# Patient Record
Sex: Female | Born: 1935 | Race: Black or African American | Hispanic: No | Smoking: Never smoker
Health system: Southern US, Community
[De-identification: ages and names within clinical notes are randomized; demographics above are authoritative.]

## PROBLEM LIST (undated history)

## (undated) DIAGNOSIS — H409 Unspecified glaucoma: Secondary | ICD-10-CM

## (undated) DIAGNOSIS — C50919 Malignant neoplasm of unspecified site of unspecified female breast: Secondary | ICD-10-CM

## (undated) DIAGNOSIS — M1990 Unspecified osteoarthritis, unspecified site: Secondary | ICD-10-CM

## (undated) DIAGNOSIS — I10 Essential (primary) hypertension: Secondary | ICD-10-CM

## (undated) DIAGNOSIS — F0390 Unspecified dementia without behavioral disturbance: Secondary | ICD-10-CM

## (undated) DIAGNOSIS — T7840XA Allergy, unspecified, initial encounter: Secondary | ICD-10-CM

## (undated) DIAGNOSIS — D649 Anemia, unspecified: Secondary | ICD-10-CM

## (undated) DIAGNOSIS — F329 Major depressive disorder, single episode, unspecified: Secondary | ICD-10-CM

## (undated) DIAGNOSIS — K922 Gastrointestinal hemorrhage, unspecified: Secondary | ICD-10-CM

## (undated) DIAGNOSIS — E785 Hyperlipidemia, unspecified: Secondary | ICD-10-CM

## (undated) HISTORY — DX: Unspecified osteoarthritis, unspecified site: M19.90

## (undated) HISTORY — PX: EYE SURGERY: SHX253

## (undated) HISTORY — DX: Anemia, unspecified: D64.9

## (undated) HISTORY — DX: Allergy, unspecified, initial encounter: T78.40XA

## (undated) HISTORY — DX: Gastrointestinal hemorrhage, unspecified: K92.2

## (undated) HISTORY — DX: Essential (primary) hypertension: I10

---

## 2004-11-07 ENCOUNTER — Ambulatory Visit: Payer: Self-pay | Admitting: Internal Medicine

## 2004-11-09 ENCOUNTER — Ambulatory Visit: Payer: Self-pay | Admitting: Internal Medicine

## 2005-04-12 ENCOUNTER — Ambulatory Visit: Payer: Self-pay | Admitting: Internal Medicine

## 2005-05-10 ENCOUNTER — Ambulatory Visit: Payer: Self-pay | Admitting: Internal Medicine

## 2005-08-08 ENCOUNTER — Ambulatory Visit: Payer: Self-pay | Admitting: Internal Medicine

## 2005-08-10 ENCOUNTER — Ambulatory Visit: Payer: Self-pay | Admitting: Internal Medicine

## 2005-12-07 ENCOUNTER — Ambulatory Visit: Payer: Self-pay | Admitting: Internal Medicine

## 2005-12-10 ENCOUNTER — Ambulatory Visit: Payer: Self-pay | Admitting: Internal Medicine

## 2005-12-18 ENCOUNTER — Ambulatory Visit: Payer: Self-pay | Admitting: Family Medicine

## 2006-01-07 ENCOUNTER — Ambulatory Visit: Payer: Self-pay | Admitting: Family Medicine

## 2006-04-05 ENCOUNTER — Ambulatory Visit: Payer: Self-pay | Admitting: Internal Medicine

## 2006-04-09 ENCOUNTER — Ambulatory Visit: Payer: Self-pay | Admitting: Internal Medicine

## 2006-04-26 ENCOUNTER — Emergency Department: Payer: Self-pay | Admitting: Emergency Medicine

## 2006-04-26 ENCOUNTER — Other Ambulatory Visit: Payer: Self-pay

## 2006-07-09 ENCOUNTER — Ambulatory Visit: Payer: Self-pay | Admitting: Family Medicine

## 2006-08-08 ENCOUNTER — Ambulatory Visit: Payer: Self-pay | Admitting: Internal Medicine

## 2006-08-10 ENCOUNTER — Ambulatory Visit: Payer: Self-pay | Admitting: Internal Medicine

## 2007-05-01 ENCOUNTER — Ambulatory Visit: Payer: Self-pay | Admitting: Gastroenterology

## 2007-08-05 ENCOUNTER — Ambulatory Visit: Payer: Self-pay | Admitting: Family Medicine

## 2007-12-11 ENCOUNTER — Ambulatory Visit: Payer: Self-pay | Admitting: Internal Medicine

## 2008-01-07 ENCOUNTER — Ambulatory Visit: Payer: Self-pay | Admitting: Internal Medicine

## 2008-01-11 ENCOUNTER — Ambulatory Visit: Payer: Self-pay | Admitting: Internal Medicine

## 2008-02-08 ENCOUNTER — Ambulatory Visit: Payer: Self-pay | Admitting: Internal Medicine

## 2008-02-12 ENCOUNTER — Ambulatory Visit: Payer: Self-pay | Admitting: Internal Medicine

## 2008-03-10 ENCOUNTER — Ambulatory Visit: Payer: Self-pay | Admitting: Internal Medicine

## 2008-04-09 ENCOUNTER — Ambulatory Visit: Payer: Self-pay | Admitting: Internal Medicine

## 2008-05-10 ENCOUNTER — Ambulatory Visit: Payer: Self-pay | Admitting: Internal Medicine

## 2008-09-27 ENCOUNTER — Ambulatory Visit: Payer: Self-pay | Admitting: Family Medicine

## 2009-10-19 ENCOUNTER — Ambulatory Visit: Payer: Self-pay | Admitting: Family Medicine

## 2010-05-19 ENCOUNTER — Emergency Department: Payer: Self-pay | Admitting: Unknown Physician Specialty

## 2010-06-17 ENCOUNTER — Emergency Department: Payer: Self-pay | Admitting: Internal Medicine

## 2010-06-28 ENCOUNTER — Ambulatory Visit: Payer: Self-pay | Admitting: Family Medicine

## 2010-06-28 ENCOUNTER — Inpatient Hospital Stay (HOSPITAL_COMMUNITY): Admission: EM | Admit: 2010-06-28 | Discharge: 2010-06-29 | Payer: Self-pay | Admitting: Emergency Medicine

## 2010-06-29 ENCOUNTER — Encounter: Payer: Self-pay | Admitting: Family Medicine

## 2010-06-29 ENCOUNTER — Ambulatory Visit: Payer: Self-pay | Admitting: Vascular Surgery

## 2010-07-08 ENCOUNTER — Emergency Department (HOSPITAL_COMMUNITY): Admission: EM | Admit: 2010-07-08 | Discharge: 2010-07-08 | Payer: Self-pay | Admitting: Emergency Medicine

## 2010-07-25 ENCOUNTER — Ambulatory Visit: Payer: Self-pay | Admitting: Unknown Physician Specialty

## 2010-07-26 LAB — PATHOLOGY REPORT

## 2010-08-03 ENCOUNTER — Emergency Department: Payer: Self-pay | Admitting: Emergency Medicine

## 2010-08-05 ENCOUNTER — Emergency Department: Payer: Self-pay | Admitting: Emergency Medicine

## 2010-11-16 ENCOUNTER — Ambulatory Visit: Payer: Self-pay | Admitting: Family Medicine

## 2011-02-24 LAB — CK TOTAL AND CKMB (NOT AT ARMC)
CK, MB: 2.2 ng/mL (ref 0.3–4.0)
Relative Index: INVALID (ref 0.0–2.5)
Total CK: 38 U/L (ref 7–177)
Total CK: 40 U/L (ref 7–177)

## 2011-02-24 LAB — CBC
HCT: 40 % (ref 36.0–46.0)
Hemoglobin: 13.3 g/dL (ref 12.0–15.0)
MCH: 29 pg (ref 26.0–34.0)
MCH: 29.7 pg (ref 26.0–34.0)
MCHC: 32.8 g/dL (ref 30.0–36.0)
MCV: 88.7 fL (ref 78.0–100.0)
MCV: 88.8 fL (ref 78.0–100.0)
MCV: 89.2 fL (ref 78.0–100.0)
Platelets: 238 10*3/uL (ref 150–400)
RBC: 4.8 MIL/uL (ref 3.87–5.11)
RDW: 15.1 % (ref 11.5–15.5)
RDW: 15.3 % (ref 11.5–15.5)
WBC: 7.4 10*3/uL (ref 4.0–10.5)
WBC: 7.6 10*3/uL (ref 4.0–10.5)

## 2011-02-24 LAB — DIFFERENTIAL
Basophils Relative: 1 % (ref 0–1)
Lymphocytes Relative: 13 % (ref 12–46)
Lymphs Abs: 1.1 10*3/uL (ref 0.7–4.0)
Monocytes Absolute: 0.7 10*3/uL (ref 0.1–1.0)
Neutro Abs: 6.4 10*3/uL (ref 1.7–7.7)
Neutrophils Relative %: 76 % (ref 43–77)

## 2011-02-24 LAB — COMPREHENSIVE METABOLIC PANEL
ALT: 17 U/L (ref 0–35)
AST: 21 U/L (ref 0–37)
Alkaline Phosphatase: 52 U/L (ref 39–117)
Alkaline Phosphatase: 61 U/L (ref 39–117)
BUN: 14 mg/dL (ref 6–23)
CO2: 26 mEq/L (ref 19–32)
CO2: 30 mEq/L (ref 19–32)
Chloride: 106 mEq/L (ref 96–112)
Creatinine, Ser: 1.12 mg/dL (ref 0.4–1.2)
GFR calc Af Amer: 56 mL/min — ABNORMAL LOW (ref >60)
GFR calc Af Amer: 56 mL/min — ABNORMAL LOW (ref >60)
GFR calc non Af Amer: 47 mL/min — ABNORMAL LOW (ref >60)
Glucose, Bld: 94 mg/dL (ref 70–99)
Sodium: 138 mEq/L (ref 135–145)
Total Protein: 7.6 g/dL (ref 6.0–8.3)

## 2011-02-24 LAB — URINE CULTURE

## 2011-02-24 LAB — BASIC METABOLIC PANEL
CO2: 26 mEq/L (ref 19–32)
Calcium: 8.9 mg/dL (ref 8.4–10.5)
Chloride: 102 mEq/L (ref 96–112)
GFR calc Af Amer: >60 mL/min (ref >60)
Glucose, Bld: 106 mg/dL — ABNORMAL HIGH (ref 70–99)
Potassium: 3.8 mEq/L (ref 3.5–5.1)
Sodium: 137 mEq/L (ref 135–145)

## 2011-02-24 LAB — URINALYSIS, ROUTINE W REFLEX MICROSCOPIC
Ketones, ur: NEGATIVE mg/dL
Urobilinogen, UA: 1 mg/dL (ref 0.0–1.0)

## 2011-02-24 LAB — TSH: TSH: 1.365 u[IU]/mL (ref 0.350–4.500)

## 2011-02-24 LAB — TROPONIN I
Troponin I: 0.01 ng/mL (ref 0.00–0.06)
Troponin I: 0.02 ng/mL (ref 0.00–0.06)

## 2011-02-24 LAB — POCT CARDIAC MARKERS: Myoglobin, poc: 88.2 ng/mL (ref 12–200)

## 2011-02-24 LAB — URINE MICROSCOPIC-ADD ON

## 2011-12-26 ENCOUNTER — Ambulatory Visit: Payer: Self-pay | Admitting: Family Medicine

## 2012-12-10 HISTORY — PX: COLONIC EMBOLIZATION: SHX1373

## 2013-01-25 LAB — COMPREHENSIVE METABOLIC PANEL
Albumin: 3.3 g/dL — ABNORMAL LOW (ref 3.4–5.0)
Anion Gap: 6 — ABNORMAL LOW (ref 7–16)
BUN: 35 mg/dL — ABNORMAL HIGH (ref 7–18)
Bilirubin,Total: 0.6 mg/dL (ref 0.2–1.0)
Chloride: 109 mmol/L — ABNORMAL HIGH (ref 98–107)
Co2: 25 mmol/L (ref 21–32)
Creatinine: 1.27 mg/dL (ref 0.60–1.30)
EGFR (African American): 45 — ABNORMAL LOW
Potassium: 4.2 mmol/L (ref 3.5–5.1)
SGOT(AST): 18 U/L (ref 15–37)
Sodium: 140 mmol/L (ref 136–145)

## 2013-01-25 LAB — CBC
HGB: 11.5 g/dL — ABNORMAL LOW (ref 12.0–16.0)
MCH: 29.2 pg (ref 26.0–34.0)
Platelet: 216 10*3/uL (ref 150–440)
WBC: 9 10*3/uL (ref 3.6–11.0)

## 2013-01-26 LAB — BASIC METABOLIC PANEL
Anion Gap: 8 (ref 7–16)
BUN: 28 mg/dL — ABNORMAL HIGH (ref 7–18)
Calcium, Total: 7.9 mg/dL — ABNORMAL LOW (ref 8.5–10.1)
Co2: 22 mmol/L (ref 21–32)
EGFR (African American): 55 — ABNORMAL LOW
Osmolality: 286 (ref 275–301)
Potassium: 3.8 mmol/L (ref 3.5–5.1)

## 2013-01-26 LAB — CBC WITH DIFFERENTIAL/PLATELET
Basophil #: 0 10*3/uL (ref 0.0–0.1)
HCT: 28.8 % — ABNORMAL LOW (ref 35.0–47.0)
HGB: 9.4 g/dL — ABNORMAL LOW (ref 12.0–16.0)
MCH: 29.3 pg (ref 26.0–34.0)
MCHC: 32.8 g/dL (ref 32.0–36.0)
Monocyte #: 0.6 x10 3/mm (ref 0.2–0.9)
Neutrophil #: 2.5 10*3/uL (ref 1.4–6.5)
RBC: 3.22 10*6/uL — ABNORMAL LOW (ref 3.80–5.20)

## 2013-01-26 LAB — LIPID PANEL: HDL Cholesterol: 45 mg/dL (ref 40–60)

## 2013-01-27 ENCOUNTER — Inpatient Hospital Stay: Payer: Self-pay | Admitting: Internal Medicine

## 2013-01-27 LAB — CBC WITH DIFFERENTIAL/PLATELET
Basophil #: 0 10*3/uL (ref 0.0–0.1)
Basophil %: 0.9 %
Basophil %: 0.7 %
Eosinophil #: 0.2 10*3/uL (ref 0.0–0.7)
Eosinophil %: 5.4 %
HGB: 9.8 g/dL — ABNORMAL LOW (ref 12.0–16.0)
HGB: 10.1 g/dL — ABNORMAL LOW (ref 12.0–16.0)
MCH: 29.4 pg (ref 26.0–34.0)
MCH: 29 pg (ref 26.0–34.0)
MCHC: 32.9 g/dL (ref 32.0–36.0)
Monocyte #: 0.5 x10 3/mm (ref 0.2–0.9)
Monocyte #: 0.5 x10 3/mm (ref 0.2–0.9)
Monocyte %: 10.5 %
Monocyte %: 8.8 %
Neutrophil #: 2.8 10*3/uL (ref 1.4–6.5)
Neutrophil #: 4 10*3/uL (ref 1.4–6.5)
Neutrophil %: 54.3 %
Platelet: 203 10*3/uL (ref 150–440)
Platelet: 205 10*3/uL (ref 150–440)
RDW: 14.7 % — ABNORMAL HIGH (ref 11.5–14.5)
WBC: 5.1 10*3/uL (ref 3.6–11.0)

## 2013-01-28 LAB — CBC WITH DIFFERENTIAL/PLATELET
Basophil #: 0 10*3/uL (ref 0.0–0.1)
Eosinophil #: 0.1 10*3/uL (ref 0.0–0.7)
Eosinophil %: 0.6 %
HCT: 29.1 % — ABNORMAL LOW (ref 35.0–47.0)
HGB: 9.5 g/dL — ABNORMAL LOW (ref 12.0–16.0)
Lymphocyte %: 7.8 %
MCH: 29.2 pg (ref 26.0–34.0)
Monocyte #: 0.7 x10 3/mm (ref 0.2–0.9)
Monocyte %: 8.3 %
Neutrophil #: 6.8 10*3/uL — ABNORMAL HIGH (ref 1.4–6.5)
Platelet: 207 10*3/uL (ref 150–440)
RBC: 3.25 10*6/uL — ABNORMAL LOW (ref 3.80–5.20)
RDW: 14.5 % (ref 11.5–14.5)

## 2013-01-29 LAB — CBC WITH DIFFERENTIAL/PLATELET
Basophil #: 0 10*3/uL (ref 0.0–0.1)
Basophil %: 0.4 %
Eosinophil %: 3.9 %
HCT: 27 % — ABNORMAL LOW (ref 35.0–47.0)
Lymphocyte #: 1.1 10*3/uL (ref 1.0–3.6)
Lymphocyte %: 16.4 %
MCH: 29.2 pg (ref 26.0–34.0)
MCV: 89 fL (ref 80–100)
Monocyte #: 0.8 x10 3/mm (ref 0.2–0.9)
Monocyte %: 12.4 %
Neutrophil #: 4.3 10*3/uL (ref 1.4–6.5)
Platelet: 197 10*3/uL (ref 150–440)
RBC: 3.04 10*6/uL — ABNORMAL LOW (ref 3.80–5.20)
WBC: 6.4 10*3/uL (ref 3.6–11.0)

## 2013-01-29 LAB — BASIC METABOLIC PANEL
Anion Gap: 8 (ref 7–16)
BUN: 5 mg/dL — ABNORMAL LOW (ref 7–18)
Calcium, Total: 8.1 mg/dL — ABNORMAL LOW (ref 8.5–10.1)
Co2: 24 mmol/L (ref 21–32)
EGFR (African American): >60
Potassium: 3.2 mmol/L — ABNORMAL LOW (ref 3.5–5.1)
Sodium: 144 mmol/L (ref 136–145)

## 2013-01-30 LAB — HEMOGLOBIN: HGB: 9 g/dL — ABNORMAL LOW (ref 12.0–16.0)

## 2014-02-13 ENCOUNTER — Emergency Department: Payer: Self-pay | Admitting: Emergency Medicine

## 2014-02-19 ENCOUNTER — Emergency Department: Payer: Self-pay | Admitting: Emergency Medicine

## 2014-03-16 DIAGNOSIS — I10 Essential (primary) hypertension: Secondary | ICD-10-CM | POA: Insufficient documentation

## 2014-03-16 HISTORY — DX: Essential (primary) hypertension: I10

## 2015-04-01 NOTE — Consult Note (Signed)
CC: lower GI bleeding.  Pt with stable hgb last 3 tests.  Passing darker stool.  Bleeding scan today to be sure no active bleeding.  Explained to family that she likely bled from diverticulosis and has quit.  Will have to go slowly on diet to allow clot in the diverrticulum to mature.  Can go to bid and then qday on hgb.  If rebleeds I will need to do colonoscopy.  Due to family hx will offer colon at a later date.  Electronic Signatures: Manya Silvas (MD)  (Signed on 18-Feb-14 11:24)  Authored  Last Updated: 18-Feb-14 11:24 by Manya Silvas (MD)

## 2015-04-01 NOTE — Consult Note (Signed)
General Aspect GI bleed   Present Illness The patient is an 81 year old female with past medical history of hypertension, depression, hyperlipidemia and peptic ulcer disease in the past. Several days ago she noticed that she had a dark red-maroon-colored stool and after that, she had similar 3 to 4 episodes today presenting to the ER for this. She was feeling mildly dizzy this morning, but denies any vomiting, nausea or abdominal pain associated with this and denies any constipation and denies any similar episode in the past. On further questioning, she admits using aspirin every day, but denies any use of any other pain medications like Advil or Motrin. She had a history of gastric ulcer disease in 2011.  Bleeding scan is positive and I am asked embolize the bleeding site.  PAST MEDICAL HISTORY:  Hypertension, hyperlipidemia, depression and peptic ulcer disease in the past.   PAST SURGICAL HISTORY:  None.   Home Medications: Medication Instructions Status  Aspirin Enteric Coated 81 mg oral delayed release tablet 1 tab(s) orally once a day Active  paroxetine 10 mg oral tablet 1 tab(s) orally once a day Active  simvastatin 80 mg oral tablet 1 tab(s) orally 2 times a week Active  Travatan Z 0.004% ophthalmic solution 1 drop(s) to each affected eye once a day (at bedtime) Active    No Known Allergies:   Case History:  Past Surgical History None   Family History Non-Contributory   Social History negative tobacco, negative ETOH, negative Illicit drugs   Review of Systems:  Fever/Chills No   Cough No   Sputum No   Abdominal Pain No   Constipation No   Nausea/Vomiting No   SOB/DOE No   Chest Pain No   Telemetry Reviewed NSR   Physical Exam:  GEN well developed, well nourished, no acute distress   HEENT hearing intact to voice, moist oral mucosa, good dentition   NECK supple  trachea midline   RESP normal resp effort  no use of accessory muscles   CARD regular rate   No LE edema  no JVD   ABD denies tenderness  denies Flank Tenderness  soft   EXTR negative cyanosis/clubbing, negative edema   SKIN normal to palpation, No rashes, No ulcers   NEURO cranial nerves intact, follows commands, motor/sensory function intact   PSYCH alert, A+O to time, place, person, good insight   Nursing/Ancillary Notes: **Vital Signs.:   19-Feb-14 03:50  Vital Signs Type Routine  Temperature Temperature (F) 97.5  Celsius 36.3  Temperature Source oral  Pulse Pulse 81  Respirations Respirations 20  Systolic BP Systolic BP 240  Diastolic BP (mmHg) Diastolic BP (mmHg) 973  Mean BP 130  Pulse Ox % Pulse Ox % 92  Pulse Ox Activity Level  At rest  Oxygen Delivery Room Air/ 21 %   Routine Chem:  17-Feb-14 05:39   Glucose, Serum 82  BUN  28  Creatinine (comp) 1.07  Sodium, Serum 141  Potassium, Serum 3.8  Chloride, Serum  111  CO2, Serum 22  Calcium (Total), Serum  7.9  Anion Gap 8  Osmolality (calc) 286  eGFR (African American)  55  eGFR (Non-African American)  48 (eGFR values <24m/min/1.73 m2 may be an indication of chronic kidney disease (CKD). Calculated eGFR is useful in patients with stable renal function. The eGFR calculation will not be reliable in acutely ill patients when serum creatinine is changing rapidly. It is not useful in  patients on dialysis. The eGFR calculation may not be  applicable to patients at the low and high extremes of body sizes, pregnant women, and vegetarians.)  Magnesium, Serum 2.1 (1.8-2.4 THERAPEUTIC RANGE: 4-7 mg/dL TOXIC: > 10 mg/dL  -----------------------)  Cholesterol, Serum  203  Triglycerides, Serum 126  HDL (INHOUSE) 45  VLDL Cholesterol Calculated 25  LDL Cholesterol Calculated  133 (Result(s) reported on 26 Jan 2013 at 06:27AM.)  Routine Hem:  17-Feb-14 00:00   Hemoglobin (CBC)  10.4 (Result(s) reported on 26 Jan 2013 at 12:23AM.)    05:39   WBC (CBC) 5.3  RBC (CBC)  3.22  Hemoglobin (CBC)  9.4   Hematocrit (CBC)  28.8  Platelet Count (CBC) 182  MCV 89  MCH 29.3  MCHC 32.8  RDW  14.8  Neutrophil % 47.0  Lymphocyte % 35.6  Monocyte % 10.8  Eosinophil % 5.8  Basophil % 0.8  Neutrophil # 2.5  Lymphocyte # 1.9  Monocyte # 0.6  Eosinophil # 0.3  Basophil # 0.0 (Result(s) reported on 26 Jan 2013 at 06:12AM.)    07:17   Hemoglobin (CBC)  9.8 (Result(s) reported on 26 Jan 2013 at 07:43AM.)    15:53   Hemoglobin (CBC)  9.7 (Result(s) reported on 26 Jan 2013 at 04:33PM.)  18-Feb-14 05:15   WBC (CBC) 5.1  RBC (CBC)  3.33  Hemoglobin (CBC)  9.8  Hematocrit (CBC)  29.8  Platelet Count (CBC) 203  MCV 90  MCH 29.4  MCHC 32.9  RDW 14.5  Neutrophil % 54.3  Lymphocyte % 28.9  Monocyte % 10.5  Eosinophil % 5.4  Basophil % 0.9  Neutrophil # 2.8  Lymphocyte # 1.5  Monocyte # 0.5  Eosinophil # 0.3  Basophil # 0.0 (Result(s) reported on 27 Jan 2013 at 06:35AM.)    14:13   WBC (CBC) 6.0  RBC (CBC)  3.48  Hemoglobin (CBC)  10.1  Hematocrit (CBC)  31.1  Platelet Count (CBC) 205  MCV 89  MCH 29.0  MCHC 32.5  RDW  14.7  Neutrophil % 66.7  Lymphocyte % 20.3  Monocyte % 8.8  Eosinophil % 3.5  Basophil % 0.7  Neutrophil # 4.0  Lymphocyte # 1.2  Monocyte # 0.5  Eosinophil # 0.2  Basophil # 0.0 (Result(s) reported on 27 Jan 2013 at 02:31PM.)   Nuclear Med:    18-Feb-14 13:23, GI Blood Loss Study - Nuc Med  GI Blood Loss Study - Nuc Med   REASON FOR EXAM:    r/o active bleeding  COMMENTS:       PROCEDURE: NM  - NM GI BLOOD LOSS STUDY  - Jan 27 2013  1:23PM     RESULT: Study is limited by patient motion. The patient received 3.0 mL   of PYP and 21.90 mCi of technetium 99 M pertechnetate. Anterior imaging   was obtained for 20 minutes at which time a second dynamic was started.   The patient subsequently voided and some additional imaging was   performed. There is abnormal activity within the left mid abdomen   laterally concerning for possible ascending colon active  hemorrhage.    IMPRESSION:   1. Abnormal GI bleeding scan. Findings concerning for active   gastrointestinal hemorrhage within the proximal half of the descending     colon.    Dictation Site: 2(*)        Verified By: Sundra Aland, M.D., MD    Impression 1.GI bleed will plan to arteriagram and embolize proximal colon to prevent further b leeding.  The risks and  benefits arediscussed with the patient, her family is in attendance.  All questions are answered and the patietn agrees to proceed. 2.  Hypertension. No medications for blood pressure right now as her blood pressure is on the lower side.  3.  Hyperlipidemia. We will continue simvastatin as she is taking at home.  4.  History of peptic ulcer disease and strong family history of colon cancer. We will do a gastroenterology consult and wait for further recommendations.   Electronic Signatures: Hortencia Pilar (MD)  (Signed 19-Feb-14 20:02)  Authored: General Aspect/Present Illness, Home Medications, Allergies, History and Physical Exam, Vital Signs, Labs, Radiology, Impression/Plan   Last Updated: 19-Feb-14 20:02 by Hortencia Pilar (MD)

## 2015-04-01 NOTE — Consult Note (Signed)
CC: LGI bleed.  Pt with minimal passage of old blood today, no abd pain on left.  abd soft, not tender.  She is wondering about diet advancement, I will have to leave that to Dr. Ronalee Belts.  Leave on clear liq for now.  Daily hgb or CBC.  Electronic Signatures: Manya Silvas (MD)  (Signed on 19-Feb-14 17:35)  Authored  Last Updated: 19-Feb-14 17:35 by Manya Silvas (MD)

## 2015-04-01 NOTE — Op Note (Signed)
PATIENT NAME:  Terry Hendrix, Terry Hendrix MR#:  [id number] DATE OF BIRTH:  09-19-1927  DATE OF PROCEDURE:  01/27/2013  PREOPERATIVE DIAGNOSES:  Gastrointestinal bleed.   POSTOPERATIVE DIAGNOSIS:  Gastrointestinal bleed.  PROCEDURE PERFORMED: 1.  Selective injection of the middle colic artery, third order catheter placement.  2.  Selective injection of the left colic artery, third order catheter placement.  3.  Abdominal aortogram.  4.  Embolization with 1 mL of 3 to 500 micron PVC beads, middle colic artery.  5.  Embolization of the left colic artery with 1.5 mL of 3 to 500 micron PVC beads.   6.  StarClose closure of the right groin.   PROCEDURE PERFORMED BY:  Katha Cabal, M.D.   SEDATION:  Versed 4 mg plus fentanyl 150 mcg administered IV.  Continuous ECG, pulse oximetry and cardiopulmonary monitoring was performed throughout the entire procedure by the interventional radiology nurse.  Total sedation time was approximately 2 hours.   ACCESS:  5 French sheath, right common femoral artery.   FLUOROSCOPY TIME:  39.4 minutes.   CONTRAST USED:  Isovue 160 mL.   INDICATIONS:  The patient is an 81 year old woman who presented to the hospital with significant gastrointestinal bleeding.  She has subsequently had four bowel movements today.  Bleeding scan is positive for left colon, appears to be near the splenic flexure.  The risks and benefits for angiography and intervention with embolization were reviewed with the patient as well as the family and all questions were answered.  The patient and family agree to proceed.   DESCRIPTION OF PROCEDURE:  The patient is taken to the special procedures, placed in supine position.  After adequate sedation is achieved, the right groin is prepped and draped in a sterile fashion.  Ultrasound is placed in a sterile sleeve.  Ultrasound is utilized secondary to lack of appropriate landmarks and to avoid vascular injury.  Under direct ultrasound visualization, common  femoral artery is identified, femoral bifurcation is localized.  Artery is scanned more proximally and point selected for access.  1% lidocaine is infiltrated in the soft tissues and access to the common femoral artery is obtained with a micropuncture needle, microwire followed by micro sheath, J-wire followed by a 5 French sheath and 5 French pigtail catheter.  The pigtail catheter is positioned at the level of T11.  AP projection of the aorta is obtained.  After review of the images, the catheter is exchanged for a VS-1.  Attempts at engaging the SMA readily are not successful and an almost lateral projection is then obtained.  This localizes the SMA quite nicely and using a VS-1 SMA is selected.  Image intensifier is returned to the AP projection and selective injection of the SMA is obtained.  A very large replaced right hepatic is noted and the middle colic appears to be an early very acute branch off of the replaced hepatic artery.  Using a prograde catheter this is selected and advanced out toward the splenic flexure.  Hand injection of contrast through the prograde demonstrates the anatomy of the middle colic distally toward the splenic flexure.  This represents third order catheter placement.  At this point 1 mL of beads is instilled at this level.   The attention is then turned to engaging the IMA and being able to select the left colic.  This was the more difficult artery to select as there appears to be a fairly high-grade stenosis at the origin of the IMA.  Ultimately a variety  of catheters were utilized including VS-1, VS-2 and a C2.  The VS-2 was successful with a floppy Glidewire and subsequently the VS-2 catheter was exchanged for a 4 French straight glide catheter.  Straight glide was then utilized to perform a hand injection and was noted to be well within the middle colic as the image identifies and 1.5 mL of beads was instilled at this level.  Several distal aortograms were obtained in various  obliquities in an attempt to localize the origin of the IMA and this was one of the difficulties in accessing the IMA.   Follow-up angiography after both instillations of the beads was performed.  Review of these images was performed.  Subsequently the catheter was removed and an oblique view of the right groin was obtained.  A common femoral artery puncture was verified and a StarClose device was deployed without difficulty and there were no immediate complications.   INTERPRETATION:  Aortogram is relatively normal with the exception of minor atherosclerotic occlusive disease.  The lateral aortogram demonstrates the origins of the celiac as well as the SMA are widely patent.   At the initial selection of the SMA demonstrates a very large replaced right hepatic and from this near its origin at a very acute angle the middle colic it originates.  SMA anatomy was otherwise unremarkable.  Middle colic was evaluated as described above and subsequently a small amount of beads was used to embolize the middle colic.   Distal aorta does have diffuse atherosclerotic changes, but there are no hemodynamically significant stenoses.  There is a high-grade stenosis, perhaps 70%, at the origin of the IMA.  IMA otherwise from its initial selection appears relatively normal and actually somewhat larger than typically seen.  The left colic is quite prominent and the Glidewire is negotiated into the left colic and subsequently a glide catheter advanced out and embolization performed.   SUMMARY:  Successful embolization of the proximal descending colon from both a middle colic as well as the left colic vantage points.     ____________________________ Katha Cabal, MD ggs:ea D: 01/27/2013 18:36:57 ET T: 01/28/2013 06:41:14 ET JOB#: [id number]  cc: Katha Cabal, MD, <Dictator> Katha Cabal, MD Dr. Jordan Likes, MD Katha Cabal MD ELECTRONICALLY SIGNED 02/03/2013 11:23

## 2015-04-01 NOTE — Consult Note (Signed)
Pt with continued passage of older blood so scan ordered which lit up in descending colon.  Dr. Delana Meyer of vascular surgery notified who will see pt.  I ordered a stat CBC also.  Likely diverticular bleed.  Electronic Signatures: Manya Silvas (MD)  (Signed on 18-Feb-14 14:26)  Authored  Last Updated: 18-Feb-14 14:26 by Manya Silvas (MD)

## 2015-04-01 NOTE — Consult Note (Signed)
PATIENT NAME:  Terry Hendrix, Terry Hendrix MR#:  [id number] DATE OF BIRTH:  10/02/1927  DATE OF CONSULTATION:  01/26/2013  REFERRING PHYSICIAN:     Dr. Anselm Jungling CONSULTING PHYSICIAN:  Gaylyn Cheers, MD/Zale Marcotte A. Jerelene Redden, ANP PRIMARY CARE PHYSICIAN: Veneta Penton, M.D.   REASON FOR CONSULTATION: Gastrointestinal bleed.  HISTORY OF PRESENT ILLNESS: This is an 81 year old patient with history of depression, hyperlipidemia, hypertension, peptic ulcer disease who presented with acute dark red-maroon rectal bleed yesterday at 5:00 p.m.  She reports that she has had normal bowel movements, was not eating nuts or seeds and had no antibiotic exposure. She said she passed about 5 stools and was quite surprised to see a handful of fresh dark blood. The next morning she had 2 more stools described as thick, red dark blood. There is no abdominal pain or cramps with this. The patient has had soft stool with this, maybe 3 inches long. She does keep up with colonoscopy and had a recent colonoscopy 2008 performed, notable for a 5 mm nonbleeding polyp sigmoid colon, internal nonbleeding moderate-sized hemorrhoids. GI is asked to see the patient regarding further evaluation and management.   Since the patient has been hospitalized yesterday, she has had no further bowel movements. She still has no abdominal pain although she says she is very hungry. She has had no solid food since Saturday.  Her stomach feels grumbly.  She has had no nausea, vomiting, fevers or chills. Denies any change in bowel habits prior to this event. The patient says she feels pretty well right now.   PAST MEDICAL HISTORY: 1.  Low-level monoclonal gammopathy.  2.  Iron deficiency anemia.  3.  Colon polyps. 4.  Inflammatory arthritis. 5.  History of gastric ulcers per esophagogastroduodenoscopy 2011. 6.  Anxiety. 7.  Hypertension 8.  Hypercholesterolemia. 9.  Bursitis.  10.  GERD.  11.  Gout.  12. Insomnia.   PAST SURGICAL HISTORY: None listed.    HABITS: Negative tobacco or alcohol.   FAMILY HISTORY: Siblings deceased with heart attack, sister with breast cancer. Sister and brother with colon cancer.   REVIEW OF SYSTEMS:   Ten systems reviewed. Positive for weakness, rectal bleeding as noted,.  The remaining 10 systems are negative.   SOCIAL HISTORY: The patient is a widow, her husband moved in with her 2 years ago.   MEDICATIONS:  Travatan ophthalmic solution 1 drop each eye, simvastatin 80 mg daily, ranitidine 150 mg 2 times daily, paroxetine 10 mg daily, aspirin 81 mg daily.   ALLERGIES: No known drug allergies.   PHYSICAL EXAMINATION: VITAL SIGNS: Temperature 98.2, pulse 79, respiratory rate 20, blood pressure 150/73.  GENERAL: Obese, African American female sitting in bed, looks well-appearing in no acute distress.  HEENT: The patient is normocephalic.  Conjunctivae pink. Sclerae anicteric. Oral mucosa is dry and intact.  NECK: Supple. Trachea midline.  HEART: Heart tones S1 and S2.  LUNGS: Clear to auscultation. Respirations are nonlabored.  ABDOMEN: Soft, nontender in all quadrants.  RECTAL: Digital rectal exam by me shows slight maroon blood noted, no palpable stool. No internal masses palpable.  EXTREMITIES: Lower extremities without edema, cyanosis or clubbing.  SKIN: Warm and dry without rash.  NEUROLOGIC: The patient is alert, oriented, a reasonable historian, interacting appropriately. Cranial nerves II through XII are intact.  LABORATORY AND DIAGNOSTIC DATA:  Admission blood work 01/25/2013 with glucose 136, BUN 35, creatinine 1.27, albumin 3.3. Liver panel otherwise unremarkable. Hemoglobin 11.5, WBC 9.   Repeat laboratory studies dated 01/26/2013, BUN 28,  creatinine 1.07, WBCs 5.3, hemoglobin down 9.4 to 9.8 to 9.7, stable over a 12-hour period today.   RADIOLOGY: CT of the abdomen and pelvis without IV contrast but she took oral contrast 01/25/2003 showing diffuse diverticulosis within the descending colon,  no evidence of obstructive or inflammatory abnormality. The appendix identified as unremarkable. Moderate amount of fecal retention identified in the rectosigmoid colon.   IMPRESSION: The patient apparently appears to have lower gastrointestinal bleed that is slowing. She has had no bowel movements while she has been in the hospital which is a good thing because blood in the colon is an irritant. Her hemoglobin has remained stable in the last 12 hours. The patient did have a colonoscopy in 2008.  The cause of this non-painful bleeding is probably diverticulosis versus arteriovenous malformation, possibly neoplasm, possibly a malignancy, but doubt malignancy. She does not have pain or tenderness to support ischemic colitis type picture.   PLAN: May have clear liquid diet at this time, need to monitor serial hemoglobin and possible colonoscopy this admission per Dr. Percell Boston decision since she is 81 years old at this time. This case was discussed with Dr. Vira Agar.  Thank you for the consultation.   These services provided by [facility].    ____________________________ Janalyn Harder Jerelene Redden, ANP kam:ct D: 01/26/2013 18:42:18 ET T: 01/27/2013 08:21:27 ET JOB#: [id number]  cc: Joelene Millin A. Jerelene Redden, ANP, <Dictator> Manya Silvas, MD Janalyn Harder. Sherlyn Hay, MSN, ANP-BC Adult Nurse Practitioner ELECTRONICALLY SIGNED 01/29/2013 8:57

## 2015-04-01 NOTE — Discharge Summary (Signed)
PATIENT NAME:  Terry Hendrix, Terry Hendrix MR#:  [id number] DATE OF BIRTH:  [birth year]  DATE OF ADMISSION:  01/27/2013 DATE OF DISCHARGE:  01/30/2013  PRIMARY CARE PHYSICIAN: Sena Hitch, MD.   DISCHARGE DIAGNOSES:  1.  Gastrointestinal bleed, likely diverticular, status post embolization.  2.  Acute blood loss anemia.  3.  Hypertension.  4.  Hypokalemia.   CONSULTS:  Dr. Delana Meyer of vascular surgery, Dr. Vira Agar of GI.   PROCEDURES: Include an arteriogram and embolization of the proximal colon.   ADMITTING HISTORY AND PHYSICAL: Please see detailed H and P dictated on 01/25/2013. In brief, an 81 year old female patient with history of hypertension, depression, hyperlipidemia, peptic ulcer disease who presented to the hospital complaining of dark red-maroon color stool. The patient was found to be anemic, was admitted to the hospitalist service for workup and treatment.   HOSPITAL COURSE: The patient was seen by GI. Her bleeding was thought to be a lower GI  bleed. A tagged RBC scan was done which showed bleeding after which vascular surgery, Dr. Delana Meyer was consulted who did an arteriogram and embolization of the proximal colon which to the patient's bleeding stopped. The patient's hemoglobin has been stable around 9. She does not have any abdominal pain. No further melena, bleeding, nausea, vomiting, tolerating diet and is being discharged home in fair condition to follow up with her primary care physician. The patient will need a followup CBC at the time of her appointment.   The patient also had orthostatic hypotension, but her blood pressure was high at 160 so has been started on a low-dose lisinopril. The patient has been given instructions to take time to stand up from lying position or sitting position to avoid any falls.   Acute blood loss anemia secondary above, stable, did not need any transfusion.   On the day of discharge, the patient's blood pressure is 164/81, temperature 98, pulse 77.    DISCHARGE MEDICATIONS:  Include:  1.  Paroxetine 10 mg oral once a day.  2.  Simvastatin 80 mg oral 2 times a week.  3.  Lisinopril 20 mg oral once a day.  4.  Protonix 40 mg oral once a day.  5.  Ferrous sulfate 325 mg oral (Dictation Anomaly) <<MISSING>> times a day with meals.   DISCHARGE INSTRUCTIONS: Regular diet. Activity as tolerated. Do not take aspirin until you see your primary care physician. Follow up with Dr. Vira Agar and her primary care physician in 1 to 2 weeks.   TIME SPENT: Today on this discharge activity was 35 minutes.    ____________________________ Leia Alf Roshan Salamon, MD srs:ja D: 01/30/2013 16:46:57 ET T: 01/31/2013 07:01:11 ET JOB#: [id number]  cc: Alveta Heimlich R. Darvin Neighbours, MD, <Dictator> Manya Silvas, MD Sena Hitch, MD  Neita Carp MD ELECTRONICALLY SIGNED 01/31/2013 13:03

## 2015-04-01 NOTE — H&P (Signed)
PATIENT NAME:  Terry Hendrix, CARL MR#:  [id number] DATE OF BIRTH:  04-12-1927  DATE OF ADMISSION:  01/25/2013  PRIMARY CARE PHYSICIAN:  Sena Hitch, MD  REFERRING EMERGENCY ROOM PHYSICIAN:  Sheryl L. Benjaman Lobe, MD  CHIEF COMPLAINT:  Dark-colored stool, 4 to 5 episodes.   HISTORY OF PRESENT ILLNESS:  This is an 82 year old female with past medical history of hypertension, depression, hyperlipidemia and peptic ulcer disease in the past who has been very healthy and living an independent life for the last few years. She noticed yesterday at 5:00 p.m. that she had a dark red-maroon-colored stool and after that, she had similar 3 to 4 episodes today presenting to the ER for this. She was feeling mildly dizzy this morning, but denies any vomiting, nausea or abdominal pain associated with this and denies any constipation and denies any similar episode in the past. On further questioning, she admits using aspirin every day, but denies any use of any other pain medications like Advil or Motrin. She had a history of gastric ulcer disease in 2011. Endoscopy was done and she was prescribed Nexium orally and after finishing the treatment, she was feeling perfectly fine. She has a strong family history of colon cancer in her brother and sister. She is getting a colonoscopy every 5 years and she is due this year for it.   REVIEW OF SYSTEMS:   CONSTITUTIONAL: Negative for fever, fatigue, weakness, pain or weight loss.  EYES: No blurring, double vision, pain or discharge.  ENT: No tinnitus, ear pain, hearing loss.  RESPIRATORY: No cough, wheezing, hemoptysis or dyspnea.  CARDIOVASCULAR: No chest pain, orthopnea, edema or arrhythmia.  GASTROINTESTINAL: No nausea, vomiting, diarrhea, or abdominal pain. Has dark, maroon-colored stool, but no other complaint.  GENITOURINARY: No dysuria, hematuria or increased frequency of the urination.  ENDOCRINE: No polyuria, nocturia or heat or cold intolerance.  SKIN: No acne,  rashes or any lesions on the skin.  MUSCULOSKELETAL: No pain or swelling of the joints.  NEUROLOGICAL: No numbness, weakness, tremors or seizure disorder.  PSYCHIATRIC: No anxiety, insomnia or depression.   PAST MEDICAL HISTORY:  Hypertension under control, hyperlipidemia, depression and peptic ulcer disease in the past.   PAST SURGICAL HISTORY:  None.   FAMILY HISTORY:  Mother has diabetes. Brother and sister had colon cancer.   SOCIAL HISTORY:  She denies smoking, drinking alcohol or doing any illegal drugs. She lives with her family and she is very independent in her day-to-day activity.   HOME MEDICATIONS:  Travatan ophthalmic solution 1 drop each eye, simvastatin 80 mg p.o. daily, ranitidine 150 mg oral capsule 2 times a day, paroxetine 10 mg oral tablet once a day, aspirin 81 mg oral daily.   PHYSICAL EXAMINATION:   VITAL SIGNS: In the ER, temperature 97.9, pulse rate 78, respirations 20, blood pressure 140/74 and pulse oximetry 96% on room air.  GENERAL: She is fully alert and oriented to time, place, and person and does not appear in any acute distress. She is cooperative with history taking and physical examination.  HEENT: Head and neck atraumatic. Conjunctivae pink. Oral mucosa moist.  NECK: Supple. No JVD.  RESPIRATORY: Bilaterally clear and equal air entry.  CARDIOVASCULAR: S1, S2 present, regular. No murmur appreciated.  ABDOMEN: Soft, nontender. Bowel sounds present. No organomegaly appreciated. NEUROLOGICAL: Grossly normal. Power is 5 out of 5 in all 4 limbs. No tremor.  PSYCHIATRIC: Does not appear in any acute psychiatric illness.  SKIN: No rashes.  LEGS: No edema.  DIAGNOSTIC DATA:  Glucose 136, BUN 35, creatinine 1.37, sodium 140, potassium 4.2, chloride 109, CO2 is 25, calcium 9.1. Total protein is 7.6, albumin 3.3, bilirubin 0.6, alkaline phosphatase 52, SGOT 18, SGPT 13. WBC is 9.3, RBC 3.93, hemoglobin 11.5, hematocrit 35.4, platelet count 216 and MCV 90. Her  hemoglobin in the past was 13.5 which was 2 years ago. CT of the abdomen and pelvis: Diffuse diverticulosis identified within the descending colon, no CT evidence of obstructive or inflammatory abnormality.   ASSESSMENT AND PLAN:  An 81 year old female with past medical history of hypertension, depression and taking aspirin every day came in with maroon-colored stool 4 to 5 episodes.  1.  Gastrointestinal bleed. We will keep her n.p.o. for now. We will give proton pump inhibitor intravenously b.i.d. as possibility of upper gastrointestinal bleed cannot be ruled out in history of aspirin use and she has history of peptic ulcer disease in the past. Although CAT scan shows diverticular disease, possibly it might be lower gastrointestinal bleed, but we will wait for gastroenterology consult for any further workup. Hemoglobin every 8 hours. Currently, her hemoglobin is not in the dangerous range so will not need transfusion right now.  2.  Hypertension. No medications for blood pressure right now as her blood pressure is on the lower side.  3.  Hyperlipidemia. We will continue simvastatin as she is taking at home.  4.  History of peptic ulcer disease and strong family history of colon cancer. We will do a gastroenterology consult and wait for further recommendations.   CODE STATUS:  Full code.   TOTAL TIME SPENT:  60 minutes.    ____________________________ Ceasar Lund Anselm Jungling, MD [id number] D: 01/25/2013 21:59:18 ET T: 01/25/2013 23:03:43 ET JOB#: [id number]  cc: Ceasar Lund. Anselm Jungling, MD, <Dictator> Sena Hitch, MD Vaughan Basta MD ELECTRONICALLY SIGNED 01/30/2013 19:51

## 2015-04-01 NOTE — Consult Note (Signed)
CC: LGI bleed.  No abd pain, ate some supper without problems, had small  amt of darkish blood with stool after eating.  Only stool today.  Ate real food tonight.  May be ready to go home in 1-2 days if no further problems.  Abd non tender at this time.  Electronic Signatures: Manya Silvas (MD)  (Signed on 20-Feb-14 17:53)  Authored  Last Updated: 20-Feb-14 17:53 by Manya Silvas (MD)

## 2015-04-01 NOTE — Consult Note (Signed)
CC:LGI bleed.  Pt hgb stable since this morning.  Pt reports some passages of blood, nurse who saw one thought it was old blood mostly with wiping.  Pt had CT in past that showed diverticulosis.  Given painless bleeding she likely is bleeding from diverticulosis.  If does not quit will need colonoscopy.l  Electronic Signatures: Manya Silvas (MD)  (Signed on 17-Feb-14 21:10)  Authored  Last Updated: 17-Feb-14 21:10 by Manya Silvas (MD)

## 2016-04-18 DIAGNOSIS — M25511 Pain in right shoulder: Secondary | ICD-10-CM | POA: Insufficient documentation

## 2016-05-04 ENCOUNTER — Inpatient Hospital Stay
Admission: EM | Admit: 2016-05-04 | Discharge: 2016-05-10 | DRG: 379 | Disposition: A | Discharge status: Home / Self Care | Payer: Medicare Other | Attending: Internal Medicine | Admitting: Internal Medicine

## 2016-05-04 ENCOUNTER — Emergency Department: Payer: Medicare Other

## 2016-05-04 DIAGNOSIS — F329 Major depressive disorder, single episode, unspecified: Secondary | ICD-10-CM | POA: Diagnosis present

## 2016-05-04 DIAGNOSIS — Z79899 Other long term (current) drug therapy: Secondary | ICD-10-CM | POA: Diagnosis not present

## 2016-05-04 DIAGNOSIS — E785 Hyperlipidemia, unspecified: Secondary | ICD-10-CM | POA: Diagnosis present

## 2016-05-04 DIAGNOSIS — Z833 Family history of diabetes mellitus: Secondary | ICD-10-CM | POA: Diagnosis not present

## 2016-05-04 DIAGNOSIS — N858 Other specified noninflammatory disorders of uterus: Secondary | ICD-10-CM | POA: Diagnosis present

## 2016-05-04 DIAGNOSIS — I4891 Unspecified atrial fibrillation: Secondary | ICD-10-CM | POA: Diagnosis present

## 2016-05-04 DIAGNOSIS — N952 Postmenopausal atrophic vaginitis: Secondary | ICD-10-CM | POA: Diagnosis present

## 2016-05-04 DIAGNOSIS — I10 Essential (primary) hypertension: Secondary | ICD-10-CM | POA: Diagnosis present

## 2016-05-04 DIAGNOSIS — K921 Melena: Secondary | ICD-10-CM | POA: Diagnosis present

## 2016-05-04 DIAGNOSIS — R310 Gross hematuria: Secondary | ICD-10-CM | POA: Diagnosis present

## 2016-05-04 DIAGNOSIS — H409 Unspecified glaucoma: Secondary | ICD-10-CM | POA: Diagnosis present

## 2016-05-04 DIAGNOSIS — N950 Postmenopausal bleeding: Secondary | ICD-10-CM | POA: Diagnosis present

## 2016-05-04 DIAGNOSIS — K922 Gastrointestinal hemorrhage, unspecified: Secondary | ICD-10-CM | POA: Diagnosis not present

## 2016-05-04 DIAGNOSIS — K5791 Diverticulosis of intestine, part unspecified, without perforation or abscess with bleeding: Principal | ICD-10-CM | POA: Diagnosis present

## 2016-05-04 DIAGNOSIS — K5793 Diverticulitis of intestine, part unspecified, without perforation or abscess with bleeding: Secondary | ICD-10-CM

## 2016-05-04 DIAGNOSIS — R319 Hematuria, unspecified: Secondary | ICD-10-CM

## 2016-05-04 DIAGNOSIS — F419 Anxiety disorder, unspecified: Secondary | ICD-10-CM | POA: Diagnosis present

## 2016-05-04 HISTORY — DX: Unspecified glaucoma: H40.9

## 2016-05-04 HISTORY — DX: Hyperlipidemia, unspecified: E78.5

## 2016-05-04 HISTORY — DX: Major depressive disorder, single episode, unspecified: F32.9

## 2016-05-04 HISTORY — DX: Essential (primary) hypertension: I10

## 2016-05-04 HISTORY — DX: Gastrointestinal hemorrhage, unspecified: K92.2

## 2016-05-04 LAB — CBC WITH DIFFERENTIAL/PLATELET
BASOS ABS: 0 10*3/uL (ref 0–0.1)
Basophils Absolute: 0 10*3/uL (ref 0–0.1)
Basophils Relative: 1 %
EOS ABS: 0.1 10*3/uL (ref 0–0.7)
EOS PCT: 1 %
Eosinophils Absolute: 0.1 10*3/uL (ref 0–0.7)
Eosinophils Relative: 1 %
HCT: 38.4 % (ref 35.0–47.0)
HEMATOCRIT: 42.5 % (ref 35.0–47.0)
HEMOGLOBIN: 13.8 g/dL (ref 12.0–16.0)
HEMOGLOBIN: 12.5 g/dL (ref 12.0–16.0)
LYMPHS ABS: 1.3 10*3/uL (ref 1.0–3.6)
LYMPHS PCT: 18 %
Lymphocytes Relative: 10 %
Lymphs Abs: 0.7 10*3/uL — ABNORMAL LOW (ref 1.0–3.6)
MCH: 29.1 pg (ref 26.0–34.0)
MCH: 29.5 pg (ref 26.0–34.0)
MCHC: 32.6 g/dL (ref 32.0–36.0)
MCHC: 32.6 g/dL (ref 32.0–36.0)
MCV: 89.4 fL (ref 80.0–100.0)
MCV: 90.6 fL (ref 80.0–100.0)
MONOS PCT: 9 %
Monocytes Absolute: 0.6 10*3/uL (ref 0.2–0.9)
Monocytes Absolute: 0.7 10*3/uL (ref 0.2–0.9)
Monocytes Relative: 8 %
NEUTROS ABS: 5.9 10*3/uL (ref 1.4–6.5)
NEUTROS PCT: 71 %
Neutro Abs: 5 10*3/uL (ref 1.4–6.5)
Platelets: 178 10*3/uL (ref 150–440)
Platelets: 187 10*3/uL (ref 150–440)
RBC: 4.75 MIL/uL (ref 3.80–5.20)
RBC: 4.24 MIL/uL (ref 3.80–5.20)
RDW: 14.7 % — AB (ref 11.5–14.5)
RDW: 14.5 % (ref 11.5–14.5)
WBC: 7.4 10*3/uL (ref 3.6–11.0)
WBC: 7.1 10*3/uL (ref 3.6–11.0)

## 2016-05-04 LAB — COMPREHENSIVE METABOLIC PANEL
ALBUMIN: 3.8 g/dL (ref 3.5–5.0)
ALK PHOS: 39 U/L (ref 38–126)
ALT: 14 U/L (ref 14–54)
AST: 19 U/L (ref 15–41)
Anion gap: 7 (ref 5–15)
BILIRUBIN TOTAL: 0.6 mg/dL (ref 0.3–1.2)
BUN: 21 mg/dL — AB (ref 6–20)
CO2: 24 mmol/L (ref 22–32)
Calcium: 9.5 mg/dL (ref 8.9–10.3)
Chloride: 107 mmol/L (ref 101–111)
Creatinine, Ser: 1.22 mg/dL — ABNORMAL HIGH (ref 0.44–1.00)
GFR calc Af Amer: 45 mL/min — ABNORMAL LOW (ref >60)
GFR calc non Af Amer: 39 mL/min — ABNORMAL LOW (ref >60)
GLUCOSE: 106 mg/dL — AB (ref 65–99)
POTASSIUM: 4.2 mmol/L (ref 3.5–5.1)
Sodium: 138 mmol/L (ref 135–145)
TOTAL PROTEIN: 7.3 g/dL (ref 6.5–8.1)

## 2016-05-04 LAB — PROTIME-INR
INR: 1.13
Prothrombin Time: 14.7 seconds (ref 11.4–15.0)

## 2016-05-04 LAB — TYPE AND SCREEN
ABO/RH(D): O NEG
ANTIBODY SCREEN: NEGATIVE

## 2016-05-04 LAB — ABO/RH: ABO/RH(D): O NEG

## 2016-05-04 MED ORDER — HYDRALAZINE HCL 20 MG/ML IJ SOLN
INTRAMUSCULAR | Status: AC
  Filled 2016-05-04: qty 1

## 2016-05-04 MED ORDER — TRAVOPROST 0.004 % OP SOLN: 1.0000 [drp] | Freq: Every day | OPHTHALMIC | Status: DC

## 2016-05-04 MED ORDER — PAROXETINE HCL 10 MG PO TABS
10.0000 mg | ORAL_TABLET | Freq: Every day | ORAL | Status: DC
  Administered 2016-05-04 – 2016-05-10 (×7): 10 mg via ORAL
  Filled 2016-05-04 (×7): qty 1

## 2016-05-04 MED ORDER — HYDROCODONE-ACETAMINOPHEN 5-325 MG PO TABS: 1.0000 | ORAL_TABLET | ORAL | Status: DC | PRN

## 2016-05-04 MED ORDER — TECHNETIUM TC 99M-LABELED RED BLOOD CELLS IV KIT
20.0000 | PACK | Freq: Once | INTRAVENOUS | Status: AC | PRN
  Administered 2016-05-04: 17.075 via INTRAVENOUS

## 2016-05-04 MED ORDER — ONDANSETRON HCL 4 MG PO TABS: 4.0000 mg | ORAL_TABLET | Freq: Four times a day (QID) | ORAL | Status: DC | PRN

## 2016-05-04 MED ORDER — HYDRALAZINE HCL 20 MG/ML IJ SOLN
10.0000 mg | Freq: Four times a day (QID) | INTRAMUSCULAR | Status: DC | PRN
  Administered 2016-05-04 – 2016-05-10 (×3): 10 mg via INTRAVENOUS
  Filled 2016-05-04 (×2): qty 1

## 2016-05-04 MED ORDER — AMLODIPINE BESYLATE 10 MG PO TABS
10.0000 mg | ORAL_TABLET | Freq: Every day | ORAL | Status: DC
  Administered 2016-05-04 – 2016-05-10 (×7): 10 mg via ORAL
  Filled 2016-05-04 (×7): qty 1

## 2016-05-04 MED ORDER — PANTOPRAZOLE SODIUM 40 MG IV SOLR
40.0000 mg | Freq: Two times a day (BID) | INTRAVENOUS | Status: DC
  Administered 2016-05-04 – 2016-05-09 (×10): 40 mg via INTRAVENOUS
  Filled 2016-05-04 (×10): qty 40

## 2016-05-04 MED ORDER — SODIUM CHLORIDE 0.9 % IV SOLN
INTRAVENOUS | Status: DC
  Administered 2016-05-04 – 2016-05-05 (×3): via INTRAVENOUS

## 2016-05-04 MED ORDER — ONDANSETRON HCL 4 MG/2ML IJ SOLN: 4.0000 mg | Freq: Four times a day (QID) | INTRAMUSCULAR | Status: DC | PRN

## 2016-05-04 MED ORDER — LATANOPROST 0.005 % OP SOLN
1.0000 [drp] | Freq: Every day | OPHTHALMIC | Status: DC
  Administered 2016-05-04 – 2016-05-09 (×6): 1 [drp] via OPHTHALMIC
  Filled 2016-05-04 (×2): qty 2.5

## 2016-05-04 MED ORDER — LABETALOL HCL 5 MG/ML IV SOLN
10.0000 mg | INTRAVENOUS | Status: DC | PRN
  Administered 2016-05-04: 10 mg via INTRAVENOUS
  Filled 2016-05-04: qty 4

## 2016-05-04 MED ORDER — METOPROLOL TARTRATE 5 MG/5ML IV SOLN
5.0000 mg | Freq: Once | INTRAVENOUS | Status: AC
  Administered 2016-05-04: 5 mg via INTRAVENOUS
  Filled 2016-05-04: qty 5

## 2016-05-04 MED ORDER — ACETAMINOPHEN 650 MG RE SUPP: 650.0000 mg | Freq: Four times a day (QID) | RECTAL | Status: DC | PRN

## 2016-05-04 MED ORDER — SENNOSIDES-DOCUSATE SODIUM 8.6-50 MG PO TABS: 1.0000 | ORAL_TABLET | Freq: Every evening | ORAL | Status: DC | PRN

## 2016-05-04 MED ORDER — SODIUM CHLORIDE 0.9% FLUSH
3.0000 mL | Freq: Two times a day (BID) | INTRAVENOUS | Status: DC
  Administered 2016-05-04 – 2016-05-10 (×11): 3 mL via INTRAVENOUS

## 2016-05-04 MED ORDER — SODIUM CHLORIDE 0.9 % IV BOLUS (SEPSIS)
1000.0000 mL | Freq: Once | INTRAVENOUS | Status: AC
  Administered 2016-05-04: 1000 mL via INTRAVENOUS

## 2016-05-04 MED ORDER — ACETAMINOPHEN 325 MG PO TABS
650.0000 mg | ORAL_TABLET | Freq: Four times a day (QID) | ORAL | Status: DC | PRN
  Administered 2016-05-10: 650 mg via ORAL
  Filled 2016-05-04: qty 2

## 2016-05-04 NOTE — Consult Note (Signed)
GI Inpatient Consult Note  Reason for Consult: Lower GI bleed   Attending Requesting Consult: Dr. Benjie Karvonen  History of Present Illness: Terry Hendrix is a 82 y.o. female with a history of HTN, HLD, and depression admitted with a lower GI bleed.  Patient reports several episodes of BRB with loose stool this morning.  The blood turned the commode water red.  Associated symptoms include dizziness and lightheadedness.  No weakness, fatigue, CP, or SOB.  Also no abdominal pain, nausea/vomiting, hematemesis, or melena.  She has a h/o previous lower GI bleed in 2014; a bleeding scan demonstrated active hemorrhage w/in the proximal descending colon.  An arteriogram with embolization was performed due to persistent bleeding (Dr. Ronalee Belts).  Since 2014, patient denies recurrence of bleeding prior to today.  She experienced one episode of bleeding while in the ED, prior to bleeding scan.   Notable labs: CBC WNL; BUN 21, Cr 1.22 NM bleeding scan pending  Past Medical History:  Past Medical History  Diagnosis Date  . MDD (major depressive disorder) ([place])   . Hypertension   . Glaucoma   . HLD (hyperlipidemia)     Problem List: Patient Active Problem List   Diagnosis Date Noted  . GIB (gastrointestinal bleeding) 05/04/2016  . Atrial fibrillation ([place]) 05/04/2016    Past Surgical History: History reviewed. No pertinent past surgical history.  Allergies: No Known Allergies  Home Medications:  (Not in a hospital admission) Home medication reconciliation was completed with the patient.   Scheduled Inpatient Medications:     Continuous Inpatient Infusions:     PRN Inpatient Medications:  hydrALAZINE, labetalol  Family History: family history is not on file.    Social History:   reports that she has never smoked. She does not have any smokeless tobacco history on file. She reports that she does not drink alcohol.   Review of Systems: Constitutional: Weight is stable.  Eyes: No changes in  vision. ENT: No oral lesions, sore throat.  GI: see HPI.  Heme/Lymph: No easy bruising.  CV: No chest pain.  GU: No hematuria.  Integumentary: No rashes.  Neuro: No headaches.  Psych: No depression/anxiety.  Endocrine: No heat/cold intolerance.  Allergic/Immunologic: No urticaria.  Resp: No cough, SOB.  Musculoskeletal: No joint swelling.    Physical Examination: BP 190/101 mmHg  Pulse 75  Temp(Src) 98.1 F (36.7 C) (Oral)  Resp 21  SpO2 98% Gen: NAD, alert and oriented x 4 HEENT: PEERLA, EOMI, Neck: supple, no JVD or thyromegaly Chest: CTA bilaterally, no wheezes, crackles, or other adventitious sounds CV: RRR, no m/g/c/r Abd: soft, NT, ND, +BS in all four quadrants; no HSM, guarding, ridigity, or rebound tenderness Ext: no edema, well perfused with 2+ pulses, Skin: no rash or lesions noted Lymph: no LAD  Data: Lab Results  Component Value Date   WBC 7.4 05/04/2016   HGB 13.8 05/04/2016   HCT 42.5 05/04/2016   MCV 89.4 05/04/2016   PLT 178 05/04/2016    Recent Labs Lab 05/04/16 1037  HGB 13.8   Lab Results  Component Value Date   NA 138 05/04/2016   K 4.2 05/04/2016   CL 107 05/04/2016   CO2 24 05/04/2016   BUN 21* 05/04/2016   CREATININE 1.22* 05/04/2016   Lab Results  Component Value Date   ALT 14 05/04/2016   AST 19 05/04/2016   ALKPHOS 39 05/04/2016   BILITOT 0.6 05/04/2016    Recent Labs Lab 05/04/16 1037  INR 1.13   Assessment/Plan:  Terry Hendrix is a 82 y.o. female with a history of HTN, HLD, and depression admitted with a lower GI bleed.  She experienced a lower GI bleed which required embolization in 2014.  Patient is also hypertensive.  Hgb currently stable.  NM bleeding scan negative for active bleeding.  Possibly diverticular in nature.  Will monitor CBC q 8 hours and continue clear liquids for now.  Further recs pending patient's progression and the serial Hgb.   Recommendations: - Check CBC q 8 hours - Continue clear liquids -  Further recs pending results  Thank you for the consult. We will follow along with you. Please call with questions or concerns.  Lavera Guise, PA-C [facility] Gastro[facility]ology Phone: [phone] Pager: [phone]

## 2016-05-04 NOTE — Consult Note (Signed)
PAtient with extensive diverticulosis has GI bleeding with BRBPR.  She had similar story  A few years ago and I saw her at that time.  Her last stool was smaller with much less blood.  She denies any abd pain at this time.  She has signif hypertension with last BP 175/105. No upper GI problems.  Will give her water only to night and then clear liquids in morning.  Dr. Drema Dallas to be on call starting tonight until Monday morning.

## 2016-05-04 NOTE — ED Notes (Signed)
Pt transported to [facility] GI.

## 2016-05-04 NOTE — H&P (Addendum)
[place] [facility] [place] NAME: Terry Hendrix    MR#:  [id number]  DATE OF BIRTH:  [birth year]  DATE OF ADMISSION:  05/04/2016  PRIMARY CARE PHYSICIAN:  Dr Shade Flood REQUESTING/REFERRING PHYSICIAN: Dr Shawna Orleans CHIEF COMPLAINT:   BRBPR  HISTORY OF PRESENT ILLNESS:  Thy Bender  is a 82 y.o. female with a known history of Embolization for GI bleed and hypertension who is currently off of medications due to fluctuations in blood pressure who presents with above complaint. Patient woke up this morning with bright red blood per rectum. Since then she had another episode while in the emergency room. Her blood pressure systolic is [id number]. She also has orthostatic vital signs and reports feeling dizzy and lightheaded. She denies shortness of breath or chest pain. She denies abdominal pain or cramping.  PAST MEDICAL HISTORY:   Past Medical History  Diagnosis Date  . MDD (major depressive disorder) ([place])   . Hypertension     PAST SURGICAL HISTORY:  None  SOCIAL HISTORY:   No tobacco, EtOH or IV drug use  FAMILY HISTORY:  Mother with diabetes  DRUG ALLERGIES:  No Known Allergies  REVIEW OF SYSTEMS:   Review of Systems  Constitutional: Negative for fever, chills and malaise/fatigue.  HENT: Negative for ear discharge, ear pain, hearing loss, nosebleeds and sore throat.   Eyes: Negative for blurred vision and pain.  Respiratory: Negative for cough, hemoptysis, shortness of breath and wheezing.   Cardiovascular: Negative for chest pain, palpitations and leg swelling.  Gastrointestinal: Positive for blood in stool. Negative for nausea, vomiting, abdominal pain and diarrhea.  Genitourinary: Negative for dysuria.  Musculoskeletal: Negative for back pain.  Neurological: Negative for dizziness, tremors, speech change, focal weakness, seizures and headaches.  Endo/Heme/Allergies: Does not bruise/bleed easily.  Psychiatric/Behavioral: Negative for depression,  suicidal ideas and hallucinations.    MEDICATIONS AT HOME:   Prior to Admission medications   Medication Sig Start Date End Date Taking? Authorizing Provider  atorvastatin (LIPITOR) 40 MG tablet Take 40 mg by mouth at bedtime.   Yes Historical Provider, MD  pantoprazole (PROTONIX) 40 MG tablet Take 40 mg by mouth daily.   Yes Historical Provider, MD  PARoxetine (PAXIL) 10 MG tablet Take 10 mg by mouth daily.   Yes Historical Provider, MD  travoprost, benzalkonium, (TRAVATAN) 0.004 % ophthalmic solution Place 1 drop into both eyes at bedtime.   Yes Historical Provider, MD      VITAL SIGNS:  Blood pressure 223/128, pulse 86, temperature 98.1 F (36.7 C), temperature source Oral, resp. rate 16, SpO2 100 %.  PHYSICAL EXAMINATION:   Physical Exam  Constitutional: She is oriented to person, place, and time and well-developed, well-nourished, and in no distress. No distress.  HENT:  Head: Normocephalic.  Eyes: No scleral icterus.  Neck: Normal range of motion. Neck supple. No JVD present. No tracheal deviation present.  Cardiovascular: Normal rate and regular rhythm.  Exam reveals no gallop and no friction rub.   Murmur heard. Pulmonary/Chest: Effort normal and breath sounds normal. No respiratory distress. She has no wheezes. She has no rales. She exhibits no tenderness.  Abdominal: Soft. Bowel sounds are normal. She exhibits no distension and no mass. There is no tenderness. There is no rebound and no guarding.  Musculoskeletal: Normal range of motion. She exhibits no edema.  Neurological: She is alert and oriented to person, place, and time.  Skin: Skin is warm. No rash noted. No erythema.  Psychiatric: Affect and  judgment normal.      LABORATORY PANEL:   CBC  Recent Labs Lab 05/04/16 1037  WBC 7.4  HGB 13.8  HCT 42.5  PLT 178   ------------------------------------------------------------------------------------------------------------------  Chemistries   Recent  Labs Lab 05/04/16 1037  NA 138  K 4.2  CL 107  CO2 24  GLUCOSE 106*  BUN 21*  CREATININE 1.22*  CALCIUM 9.5  AST 19  ALT 14  ALKPHOS 39  BILITOT 0.6   ------------------------------------------------------------------------------------------------------------------  Cardiac Enzymes No results for input(s): TROPONINI in the last 168 hours. ------------------------------------------------------------------------------------------------------------------  RADIOLOGY:  No results found.  EKG:     IMPRESSION AND PLAN:   82 year old female with a history of GI bleed in the past status post embolization who presents with bright red blood per rectum and malignant hypertension.  1. Bright red blood per rectum with orthostasis: Order GI bleeding scan once blood pressure improves. Hemoglobin may artificially be elevated. If this is positive then we will need to consult vascular surgery for possible embolization. Continue to monitor hemoglobin every 6 hours. Case discussed with Dr. Vira Agar.  2. Malignant hypertension: Start hydralazine and labetalol when necessary with parameters. Start Norvasc 10 mg daily. Follow blood pressure closely.  3. Depression: Continue Paxil 4. Glaucoma: Continue eyedrops. All the records are reviewed and case discussed with ED provider. Management plans discussed with the patient and she in agreement  CODE STATUS: FULL  CRITICAL CARE TOTAL TIME TAKING CARE OF THIS PATIENT: 50 minutes.    Rosser Collington M.D on 05/04/2016 at 12:26 PM  Between 7am to 6pm - Pager - [phone]  After 6pm go to [email or web address] - password [email or web address] [facility]  Office  [phone]  CC: Primary care physician; No primary care provider on file.

## 2016-05-04 NOTE — ED Notes (Signed)
Pt bp elevated. MD notified. GI study is stat per MD. MD okay with pt going to GI study after bp medications are given.

## 2016-05-04 NOTE — ED Notes (Addendum)
Pt came to ED via EMS c/o rectal bleeding. Pt reports blood in stool. Reports she has had to stay in hospital before for same issue.

## 2016-05-04 NOTE — ED Notes (Signed)
Pt had another episode of rectal bleeding. MD notified.

## 2016-05-04 NOTE — ED Provider Notes (Addendum)
CSN: [id number]     Arrival date & time 05/04/16  0957 History   First MD Initiated Contact with Patient 05/04/16 1005     Chief Complaint  Patient presents with  . Rectal Bleeding     (Consider location/radiation/quality/duration/timing/severity/associated sxs/prior Treatment) The history is provided by the patient.  Terry Hendrix is a 82 y.o. female hx of depression, HTN, Here presenting with rectal bleeding. She was in the commode today and had an episode of bright red blood per rectum. She noticed some blood clots in the toilet as well as when she wipes. She has a history of diverticulosis and was admitted 4 months ago for the same and actually required IR embolization. She is not currently on any blood thinners. Denies any abdominal pain or fevers or vomiting. Denies chest pain or dizziness or passing out.    Past Medical History  Diagnosis Date  . MDD (major depressive disorder) ([place])   . Hypertension    History reviewed. No pertinent past surgical history. No family history on file. Social History  Substance Use Topics  . Smoking status: Never Smoker   . Smokeless tobacco: None  . Alcohol Use: No   OB History    No data available     Review of Systems  Gastrointestinal: Positive for blood in stool and hematochezia.  All other systems reviewed and are negative.     Allergies  Review of patient's allergies indicates no known allergies.  Home Medications   Prior to Admission medications   Not on File   BP 203/94 mmHg  Pulse 72  Temp(Src) 98.1 F (36.7 C) (Oral)  Resp 18 Physical Exam  Constitutional: She is oriented to person, place, and time. She appears well-developed.  HENT:  Head: Normocephalic.  Mouth/Throat: Oropharynx is clear and moist.  Eyes: Conjunctivae are normal. Pupils are equal, round, and reactive to light.  Neck: Normal range of motion. Neck supple.  Cardiovascular: Normal rate, regular rhythm and normal heart sounds.   Pulmonary/Chest:  Effort normal and breath sounds normal. No respiratory distress. She has no wheezes. She has no rales.  Abdominal: Soft. Bowel sounds are normal. She exhibits no distension. There is no tenderness. There is no rebound.  Genitourinary:  Rectal- small hemorrhoids, no active bleeding, brown stool   Musculoskeletal: Normal range of motion.  Neurological: She is alert and oriented to person, place, and time.  Skin: Skin is warm and dry.  Psychiatric: She has a normal mood and affect. Her behavior is normal. Judgment and thought content normal.  Vitals reviewed.   ED Course  Procedures (including critical care time) Labs Review Labs Reviewed  CBC WITH DIFFERENTIAL/PLATELET - Abnormal; Notable for the following:    RDW 14.7 (*)    Lymphs Abs 0.7 (*)    All other components within normal limits  COMPREHENSIVE METABOLIC PANEL - Abnormal; Notable for the following:    Glucose, Bld 106 (*)    BUN 21 (*)    Creatinine, Ser 1.22 (*)    GFR calc non Af Amer 39 (*)    GFR calc Af Amer 45 (*)    All other components within normal limits  PROTIME-INR  TYPE AND SCREEN  TYPE AND SCREEN    Imaging Review No results found. I have personally reviewed and evaluated these images and lab results as part of my medical decision-making.   EKG Interpretation None      MDM   Final diagnoses:  None   Frances Furbish  is a 82 y.o. female here with blood in stool. Concerned for diverticular bleed. Abdomen nontender. Given hx of diverticular bleeds requiring IR embolization, will check labs and likely admit for observation.   12:03 PM Hg 13. But BUN/Cr slightly elevated likely from GI bleed. Will admit for observation.   12:17 PM BP 223/128 but dropped to 140 when standing up. Was on BP meds before. Hospitalist request that I order BP meds. Given renal insufficiency, will avoid lisinopril or any diuretics. Will give lopressor 5 mg IV to improve BP.   Wandra Arthurs, MD 05/04/16 1204  Wandra Arthurs,  MD 05/04/16 [phone]

## 2016-05-05 LAB — CBC WITH DIFFERENTIAL/PLATELET
BASOS ABS: 0 10*3/uL (ref 0–0.1)
Basophils Absolute: 0 10*3/uL (ref 0–0.1)
Basophils Absolute: 0 10*3/uL (ref 0–0.1)
Basophils Relative: 1 %
EOS ABS: 0.1 10*3/uL (ref 0–0.7)
EOS ABS: 0.1 10*3/uL (ref 0–0.7)
Eosinophils Absolute: 0.1 10*3/uL (ref 0–0.7)
Eosinophils Relative: 2 %
Eosinophils Relative: 2 %
Eosinophils Relative: 2 %
HCT: 36.8 % (ref 35.0–47.0)
HEMATOCRIT: 38.2 % (ref 35.0–47.0)
HEMATOCRIT: 40.3 % (ref 35.0–47.0)
HEMOGLOBIN: 12.1 g/dL (ref 12.0–16.0)
HEMOGLOBIN: 12.5 g/dL (ref 12.0–16.0)
HEMOGLOBIN: 13.1 g/dL (ref 12.0–16.0)
LYMPHS ABS: 1.1 10*3/uL (ref 1.0–3.6)
LYMPHS ABS: 0.9 10*3/uL — AB (ref 1.0–3.6)
LYMPHS PCT: 12 %
Lymphocytes Relative: 15 %
Lymphocytes Relative: 12 %
Lymphs Abs: 0.8 10*3/uL — ABNORMAL LOW (ref 1.0–3.6)
MCH: 29.7 pg (ref 26.0–34.0)
MCH: 29.4 pg (ref 26.0–34.0)
MCH: 29.3 pg (ref 26.0–34.0)
MCHC: 32.9 g/dL (ref 32.0–36.0)
MCHC: 32.8 g/dL (ref 32.0–36.0)
MCHC: 32.6 g/dL (ref 32.0–36.0)
MCV: 90.4 fL (ref 80.0–100.0)
MCV: 89.7 fL (ref 80.0–100.0)
MCV: 90 fL (ref 80.0–100.0)
MONO ABS: 0.6 10*3/uL (ref 0.2–0.9)
MONO ABS: 0.7 10*3/uL (ref 0.2–0.9)
MONOS PCT: 9 %
Monocytes Absolute: 0.7 10*3/uL (ref 0.2–0.9)
Monocytes Relative: 10 %
NEUTROS ABS: 5 10*3/uL (ref 1.4–6.5)
NEUTROS ABS: 6.2 10*3/uL (ref 1.4–6.5)
NEUTROS PCT: 76 %
Neutro Abs: 5.1 10*3/uL (ref 1.4–6.5)
Neutrophils Relative %: 76 %
Platelets: 182 10*3/uL (ref 150–440)
Platelets: 189 10*3/uL (ref 150–440)
Platelets: 189 10*3/uL (ref 150–440)
RBC: 4.07 MIL/uL (ref 3.80–5.20)
RBC: 4.26 MIL/uL (ref 3.80–5.20)
RBC: 4.47 MIL/uL (ref 3.80–5.20)
RDW: 14.7 % — ABNORMAL HIGH (ref 11.5–14.5)
RDW: 14.8 % — AB (ref 11.5–14.5)
RDW: 14.6 % — AB (ref 11.5–14.5)
WBC: 7.1 10*3/uL (ref 3.6–11.0)
WBC: 6.6 10*3/uL (ref 3.6–11.0)
WBC: 8 10*3/uL (ref 3.6–11.0)

## 2016-05-05 LAB — CBC
HEMATOCRIT: 36.1 % (ref 35.0–47.0)
HEMOGLOBIN: 11.9 g/dL — AB (ref 12.0–16.0)
MCH: 29.4 pg (ref 26.0–34.0)
MCHC: 33.1 g/dL (ref 32.0–36.0)
MCV: 88.9 fL (ref 80.0–100.0)
Platelets: 175 10*3/uL (ref 150–440)
RBC: 4.06 MIL/uL (ref 3.80–5.20)
RDW: 14.7 % — ABNORMAL HIGH (ref 11.5–14.5)
WBC: 6.8 10*3/uL (ref 3.6–11.0)

## 2016-05-05 MED ORDER — HYDRALAZINE HCL 25 MG PO TABS
25.0000 mg | ORAL_TABLET | Freq: Three times a day (TID) | ORAL | Status: DC
  Administered 2016-05-05 – 2016-05-10 (×16): 25 mg via ORAL
  Filled 2016-05-05 (×16): qty 1

## 2016-05-05 NOTE — Consult Note (Signed)
GI Follow Up Note  Referring Provider: Epifanio Lesches, MD Date of Note:   05/05/2016  HPI: Terry Hendrix is a 82 y.o. female being seen in consultation at the request of Epifanio Lesches, MD for GI bleeding.  The pt believes that her bleeding has slowed down this morning. She had a negative bleeding scan and feels relatively well this am.    SCHEDULED MEDS: . amLODipine  10 mg Oral Daily  . hydrALAZINE  25 mg Oral Q8H  . latanoprost  1 drop Both Eyes QHS  . pantoprazole (PROTONIX) IV  40 mg Intravenous Q12H  . PARoxetine  10 mg Oral Daily  . sodium chloride flush  3 mL Intravenous Q12H    PHYSICAL EXAM: Filed Vitals:   05/05/16 1121 05/05/16 1154  BP: 190/107 180/96  Pulse: 84 81  Temp: 98.3 F (36.8 C)   Resp: 18     GEN: Alert, oriented x3 in no apparent distress. HEENT: Oropharynx clear. Anicteric CV: Nl rate, nl rhythm. No murmurs, rubs or gallops. LUNGS: Clear to auscultation bilaterally. No wheezes, rales or rhonchi. ABD: Bowel sounds present. Abdomen soft, nontender, nondistended. EXT: no edema NEURO: no focal neurologic deficits.  LABS: CBC Latest Ref Rng 05/05/2016 05/05/2016 05/05/2016  WBC 3.6 - 11.0 K/uL 6.6 6.8 7.1  Hemoglobin 12.0 - 16.0 g/dL 12.5 11.9(L) 12.1  Hematocrit 35.0 - 47.0 % 38.2 36.1 36.8  Platelets 150 - 440 K/uL 189 175 182    BMP Latest Ref Rng 05/04/2016 01/29/2013 01/26/2013  Glucose 65 - 99 mg/dL 106(H) 98 82  BUN 6 - 20 mg/dL 21(H) 5(L) 28(H)  Creatinine 0.44 - 1.00 mg/dL 1.22(H) 0.84 1.07  Sodium 135 - 145 mmol/L 138 144 141  Potassium 3.5 - 5.1 mmol/L 4.2 3.2(L) 3.8  Chloride 101 - 111 mmol/L 107 112(H) 111(H)  CO2 22 - 32 mmol/L 24 24 22   Calcium 8.9 - 10.3 mg/dL 9.5 8.1(L) 7.9(L)    Hepatic Function Latest Ref Rng 05/04/2016 01/25/2013 07/08/2010  Total Protein 6.5 - 8.1 g/dL 7.3 7.6 7.5  Albumin 3.5 - 5.0 g/dL 3.8 3.3(L) 3.7  AST 15 - 41 U/L 19 18 21   ALT 14 - 54 U/L 14 13 16   Alk Phosphatase 38 - 126 U/L 39 52 52   Total Bilirubin 0.3 - 1.2 mg/dL 0.6 0.6 0.5    ASSESSMENT: Terry Hendrix is a 82 y.o. female presenting with hematochezia. Per Dr. Percell Boston notes, her prior episodes were due to diverticular bleeding and were similar to these. If her bleeding continues to resolve, presume a similar etiology with this episode. Continue to monitor for now with clear liquid diet. If no futher bleeding, no need for endoscopy and thus can advance diet as appropriate.  RECOMMENDATIONS: - continue to monitor for bleeding - continue to monitor CBC (Currently stable) - monitor hemodynamics  Burrel Legrand L. Drema Dallas, MD, MPH

## 2016-05-05 NOTE — Progress Notes (Signed)
[facility] [place] NAME: Terry Hendrix    MR#:  [id number]  DATE OF BIRTH:  07-15-1927  SUBJECTIVE: 82 year old admitted for rectal bleeding. Patient bleeding scan is negative. No further episodes of loss bleeding from rectum. Hemoglobin is stable at 12.5. Patient has no abdominal pain or dizziness.  Actually blood pressure is even elevated to 180/96. Patient feels very hungry and wants to eat. Denies any other complaints.   CHIEF COMPLAINT:   Chief Complaint  Patient presents with  . Rectal Bleeding    REVIEW OF SYSTEMS:    Review of Systems  Constitutional: Negative for fever and chills.  HENT: Negative for hearing loss.   Eyes: Negative for blurred vision, double vision and photophobia.  Respiratory: Negative for cough, hemoptysis and shortness of breath.   Cardiovascular: Negative for palpitations, orthopnea and leg swelling.  Gastrointestinal: Negative for vomiting, abdominal pain and diarrhea.  Genitourinary: Negative for dysuria and urgency.  Musculoskeletal: Negative for myalgias and neck pain.  Skin: Negative for rash.  Neurological: Negative for dizziness, focal weakness, seizures, weakness and headaches.  Psychiatric/Behavioral: Negative for memory loss. The patient does not have insomnia.     Nutrition:  Tolerating Diet: Tolerating PT:      DRUG ALLERGIES:  No Known Allergies  VITALS:  Blood pressure 180/96, pulse 81, temperature 98.3 F (36.8 C), temperature source Oral, resp. rate 18, height 5\' 4"  (1.626 m), weight 71.033 kg (156 lb 9.6 oz), SpO2 99 %.  PHYSICAL EXAMINATION:   Physical Exam  GENERAL:  82 y.o.-year-old patient lying in the bed with no acute distress.  EYES: Pupils equal, round, reactive to light and accommodation. No scleral icterus. Extraocular muscles intact.  HEENT: Head atraumatic, normocephalic. Oropharynx and nasopharynx clear.  NECK:  Supple, no jugular venous distention. No  thyroid enlargement, no tenderness.  LUNGS: Normal breath sounds bilaterally, no wheezing, rales,rhonchi or crepitation. No use of accessory muscles of respiration.  CARDIOVASCULAR: S1, S2 normal. No murmurs, rubs, or gallops.  ABDOMEN: Soft, nontender, nondistended. Bowel sounds present. No organomegaly or mass.  EXTREMITIES: No pedal edema, cyanosis, or clubbing.  NEUROLOGIC: Cranial nerves II through XII are intact. Muscle strength 5/5 in all extremities. Sensation intact. Gait not checked.  PSYCHIATRIC: The patient is alert and oriented x 3.  SKIN: No obvious rash, lesion, or ulcer.    LABORATORY PANEL:   CBC  Recent Labs Lab 05/05/16 1108  WBC 6.6  HGB 12.5  HCT 38.2  PLT 189   ------------------------------------------------------------------------------------------------------------------  Chemistries   Recent Labs Lab 05/04/16 1037  NA 138  K 4.2  CL 107  CO2 24  GLUCOSE 106*  BUN 21*  CREATININE 1.22*  CALCIUM 9.5  AST 19  ALT 14  ALKPHOS 39  BILITOT 0.6   ------------------------------------------------------------------------------------------------------------------  Cardiac Enzymes No results for input(s): TROPONINI in the last 168 hours. ------------------------------------------------------------------------------------------------------------------  RADIOLOGY:  Nm Gi Blood Loss  05/04/2016  CLINICAL DATA:  Gastrointestinal bleeding since this morning with a bowel movement. The blood was bright red and dark red initially. Bright red blood after a second bowel movement today. Hypertensive. Previous gastrointestinal bleed. EXAM: NUCLEAR MEDICINE GASTROINTESTINAL BLEEDING SCAN TECHNIQUE: Sequential abdominal images were obtained following intravenous administration of Tc-82m labeled red blood cells. RADIOPHARMACEUTICALS:  17.08 mCi Tc-37m in-vitro labeled red cells. COMPARISON:  01/27/2013. FINDINGS: Normal intravascular and bladder activity. No  intestinal activity seen. IMPRESSION: No active gastrointestinal bleeding seen. Electronically Signed   By: Percell Locus.D.  On: 05/04/2016 16:25     ASSESSMENT AND PLAN:   Active Problems:   GIB (gastrointestinal bleeding)   Atrial fibrillation (HCC)   #1 GI bleed possibly episode of diverticular bleed resolved at this time with able hemoglobin and hematocrit.. Patient does not require transfusion.   bleeding scan also is negative. Seen by gastroenterology. We will start her on clear liquids. Appreciate GI low. #2 malignant essential hypertension. Start the patient on small dose hydralazine 25 mg by mouth 3 times a day. .  3 hyperlipidemia continue statins. #4 history of anxiety continue Paxil.   All the records are reviewed and case discussed with Care Management/Social Workerr. Management plans discussed with the patient, family and they are in agreement.  CODE STATUS: full  TOTAL TIME TAKING CARE OF THIS PATIENT: 35  minutes.   POSSIBLE D/C IN 1-2DAYS, DEPENDING ON CLINICAL CONDITION.   Epifanio Lesches M.D on 05/05/2016 at 12:15 PM  Between 7am to 6pm - Pager - [phone]  After 6pm go to [email or web address] - password [email or web address] [facility]  Office  [phone]  CC: Primary care physician; Petra Kuba, MD

## 2016-05-05 NOTE — Progress Notes (Signed)
Dr. Vianne Bulls notified of patient's elevated BP after morning meds. Stated that will add PO medication to treat and not to give IV medication at this time. Wilnette Kales

## 2016-05-06 LAB — CBC WITH DIFFERENTIAL/PLATELET
BASOS ABS: 0 10*3/uL (ref 0–0.1)
BASOS ABS: 0 10*3/uL (ref 0–0.1)
BASOS PCT: 1 %
BASOS PCT: 1 %
Basophils Absolute: 0 10*3/uL (ref 0–0.1)
EOS ABS: 0.2 10*3/uL (ref 0–0.7)
EOS ABS: 0.2 10*3/uL (ref 0–0.7)
Eosinophils Absolute: 0.2 10*3/uL (ref 0–0.7)
Eosinophils Relative: 3 %
Eosinophils Relative: 3 %
HCT: 35.6 % (ref 35.0–47.0)
HEMATOCRIT: 35.6 % (ref 35.0–47.0)
HEMATOCRIT: 38 % (ref 35.0–47.0)
HEMOGLOBIN: 11.9 g/dL — AB (ref 12.0–16.0)
HEMOGLOBIN: 12 g/dL (ref 12.0–16.0)
Hemoglobin: 11.7 g/dL — ABNORMAL LOW (ref 12.0–16.0)
LYMPHS ABS: 0.8 10*3/uL — AB (ref 1.0–3.6)
Lymphocytes Relative: 13 %
Lymphocytes Relative: 13 %
Lymphocytes Relative: 14 %
Lymphs Abs: 0.9 10*3/uL — ABNORMAL LOW (ref 1.0–3.6)
Lymphs Abs: 0.9 10*3/uL — ABNORMAL LOW (ref 1.0–3.6)
MCH: 29.2 pg (ref 26.0–34.0)
MCH: 29.4 pg (ref 26.0–34.0)
MCH: 29.8 pg (ref 26.0–34.0)
MCHC: 31.5 g/dL — AB (ref 32.0–36.0)
MCHC: 32.9 g/dL (ref 32.0–36.0)
MCHC: 33.3 g/dL (ref 32.0–36.0)
MCV: 88.8 fL (ref 80.0–100.0)
MCV: 89.5 fL (ref 80.0–100.0)
MCV: 93.1 fL (ref 80.0–100.0)
Monocytes Absolute: 0.7 10*3/uL (ref 0.2–0.9)
Monocytes Absolute: 0.7 10*3/uL (ref 0.2–0.9)
Monocytes Absolute: 0.7 10*3/uL (ref 0.2–0.9)
Monocytes Relative: 10 %
Monocytes Relative: 11 %
Monocytes Relative: 11 %
NEUTROS ABS: 5 10*3/uL (ref 1.4–6.5)
NEUTROS ABS: 4.4 10*3/uL (ref 1.4–6.5)
NEUTROS PCT: 72 %
NEUTROS PCT: 71 %
Neutro Abs: 5.5 10*3/uL (ref 1.4–6.5)
Neutrophils Relative %: 73 %
PLATELETS: 181 10*3/uL (ref 150–440)
Platelets: 175 10*3/uL (ref 150–440)
Platelets: 164 10*3/uL (ref 150–440)
RBC: 3.98 MIL/uL (ref 3.80–5.20)
RBC: 4.01 MIL/uL (ref 3.80–5.20)
RBC: 4.08 MIL/uL (ref 3.80–5.20)
RDW: 14.5 % (ref 11.5–14.5)
RDW: 15.2 % — ABNORMAL HIGH (ref 11.5–14.5)
RDW: 14.7 % — AB (ref 11.5–14.5)
WBC: 6.1 10*3/uL (ref 3.6–11.0)
WBC: 6.9 10*3/uL (ref 3.6–11.0)
WBC: 7.4 10*3/uL (ref 3.6–11.0)

## 2016-05-06 LAB — HEMOGLOBIN

## 2016-05-06 NOTE — Progress Notes (Signed)
[facility] at [place] NAME: Terry Hendrix    MR#:  [id number]  DATE OF BIRTH:  05/17/1927  SUBJECTIVE: 82 year old admitted for rectal bleeding. Patient bleeding scan is negative. No further episodes of loss bleeding from rectum. Marland Kitchen  Hemoglobin dropped to 11.7 from 13.1 yesterday. Gastroenterology planning an EGD. Continue clear liquids only.  denies any complaints  CHIEF COMPLAINT:   Chief Complaint  Patient presents with  . Rectal Bleeding    REVIEW OF SYSTEMS:    Review of Systems  Constitutional: Negative for fever and chills.  HENT: Negative for hearing loss.   Eyes: Negative for blurred vision, double vision and photophobia.  Respiratory: Negative for cough, hemoptysis and shortness of breath.   Cardiovascular: Negative for palpitations, orthopnea and leg swelling.  Gastrointestinal: Negative for vomiting, abdominal pain and diarrhea.  Genitourinary: Negative for dysuria and urgency.  Musculoskeletal: Negative for myalgias and neck pain.  Skin: Negative for rash.  Neurological: Negative for dizziness, focal weakness, seizures, weakness and headaches.  Psychiatric/Behavioral: Negative for memory loss. The patient does not have insomnia.     Nutrition:  Tolerating Diet: Tolerating PT:      DRUG ALLERGIES:  No Known Allergies  VITALS:  Blood pressure 153/88, pulse 71, temperature 97.5 F (36.4 C), temperature source Oral, resp. rate 18, height 5\' 4"  (1.626 m), weight 71.033 kg (156 lb 9.6 oz), SpO2 97 %.  PHYSICAL EXAMINATION:   Physical Exam  GENERAL:  82 y.o.-year-old patient lying in the bed with no acute distress.  EYES: Pupils equal, round, reactive to light and accommodation. No scleral icterus. Extraocular muscles intact.  HEENT: Head atraumatic, normocephalic. Oropharynx and nasopharynx clear.  NECK:  Supple, no jugular venous distention. No thyroid enlargement, no tenderness.  LUNGS: Normal breath sounds  bilaterally, no wheezing, rales,rhonchi or crepitation. No use of accessory muscles of respiration.  CARDIOVASCULAR: S1, S2 normal. No murmurs, rubs, or gallops.  ABDOMEN: Soft, nontender, nondistended. Bowel sounds present. No organomegaly or mass.  EXTREMITIES: No pedal edema, cyanosis, or clubbing.  NEUROLOGIC: Cranial nerves II through XII are intact. Muscle strength 5/5 in all extremities. Sensation intact. Gait not checked.  PSYCHIATRIC: The patient is alert and oriented x 3.  SKIN: No obvious rash, lesion, or ulcer.    LABORATORY PANEL:   CBC  Recent Labs Lab 05/06/16 0643  WBC 6.9  HGB TEST WILL BE CREDITED  11.9*  HCT 35.6  PLT 175   ------------------------------------------------------------------------------------------------------------------  Chemistries   Recent Labs Lab 05/04/16 1037  NA 138  K 4.2  CL 107  CO2 24  GLUCOSE 106*  BUN 21*  CREATININE 1.22*  CALCIUM 9.5  AST 19  ALT 14  ALKPHOS 39  BILITOT 0.6   ------------------------------------------------------------------------------------------------------------------  Cardiac Enzymes No results for input(s): TROPONINI in the last 168 hours. ------------------------------------------------------------------------------------------------------------------  RADIOLOGY:  Nm Gi Blood Loss  05/04/2016  CLINICAL DATA:  Gastrointestinal bleeding since this morning with a bowel movement. The blood was bright red and dark red initially. Bright red blood after a second bowel movement today. Hypertensive. Previous gastrointestinal bleed. EXAM: NUCLEAR MEDICINE GASTROINTESTINAL BLEEDING SCAN TECHNIQUE: Sequential abdominal images were obtained following intravenous administration of Tc-8m labeled red blood cells. RADIOPHARMACEUTICALS:  17.08 mCi Tc-3m in-vitro labeled red cells. COMPARISON:  01/27/2013. FINDINGS: Normal intravascular and bladder activity. No intestinal activity seen. IMPRESSION: No active  gastrointestinal bleeding seen. Electronically Signed   By: Claudie Revering M.D.   On: 05/04/2016 16:25  ASSESSMENT AND PLAN:   Active Problems:   GIB (gastrointestinal bleeding)   Atrial fibrillation (HCC)   #1 GI bleed possibly episode of diverticular bleed/PUD:     bleeding scan also is negative. Seen by gastroenterology.  As of drop in hemoglobin from 13.1-11.7 gastroenterologist has seen the patient considering the EGD ? If  Hb continues to drop. Continue clear liquids till then  #2 malignant essential hypertension. Started  the patient on small dose hydralazine 25 mg by mouth 3 times a day.   Blood pressure is better today.  3 hyperlipidemia continue statins. #4 history of anxiety continue Paxil.  D/w son. All the records are reviewed and case discussed with Care Management/Social Workerr. Management plans discussed with the patient, family and they are in agreement.  CODE STATUS: full  TOTAL TIME TAKING CARE OF THIS PATIENT: 35  minutes.   POSSIBLE D/C IN 1-2DAYS, DEPENDING ON CLINICAL CONDITION.   Epifanio Lesches M.D on 05/06/2016 at 11:47 AM  Between 7am to 6pm - Pager - [phone]  After 6pm go to [email or web address] - password [email or web address] [facility]  Office  [phone]  CC: Primary care physician; Petra Kuba, MD

## 2016-05-06 NOTE — Progress Notes (Signed)
  GI Inpatient Follow-up Note  Patient Identification: Terry Hendrix is a 82 y.o. female with   Subjective: Alert. No complaints. Small amount of red blood this am with BM.  Scheduled Inpatient Medications:  . amLODipine  10 mg Oral Daily  . hydrALAZINE  25 mg Oral Q8H  . latanoprost  1 drop Both Eyes QHS  . pantoprazole (PROTONIX) IV  40 mg Intravenous Q12H  . PARoxetine  10 mg Oral Daily  . sodium chloride flush  3 mL Intravenous Q12H    Review of Systems: The balance of a 12 system review is negative other than as described above   Physical Examination: Blood pressure 147/73, pulse 83, temperature 98.3 F (36.8 C), temperature source Oral, resp. rate 18, height 5\' 4"  (1.626 m), weight 71.033 kg (156 lb 9.6 oz), SpO2 100 %.  Gen: NAD, alert and oriented x 4 HEENT: OP clear Neck: supple, no JVD or thyromegaly Chest: CTA bilaterally, no wheezes, crackles, or rales CV: RRR, no m/g/c/r Abd: soft, NT, non-distended.  +BS in all four quadrants; no guarding, ridigity, or rebound tenderness   Data: CBC Latest Ref Rng 05/06/2016 05/06/2016 05/05/2016  WBC 3.6 - 11.0 K/uL 6.9 - 7.4  Hemoglobin 12.0 - 16.0 g/dL 11.9(L) TEST WILL BE CREDITED 11.7(L)  Hematocrit 35.0 - 47.0 % 35.6 - 35.6  Platelets 150 - 440 K/uL 175 - 181    CMP Latest Ref Rng 05/04/2016 01/29/2013 01/26/2013  Glucose 65 - 99 mg/dL 106(H) 98 82  BUN 6 - 20 mg/dL 21(H) 5(L) 28(H)  Creatinine 0.44 - 1.00 mg/dL 1.22(H) 0.84 1.07  Sodium 135 - 145 mmol/L 138 144 141  Potassium 3.5 - 5.1 mmol/L 4.2 3.2(L) 3.8  Chloride 101 - 111 mmol/L 107 112(H) 111(H)  CO2 22 - 32 mmol/L 24 24 22   Calcium 8.9 - 10.3 mg/dL 9.5 8.1(L) 7.9(L)  Total Protein 6.5 - 8.1 g/dL 7.3 - -  Total Bilirubin 0.3 - 1.2 mg/dL 0.6 - -  Alkaline Phos 38 - 126 U/L 39 - -  AST 15 - 41 U/L 19 - -  ALT 14 - 54 U/L 14 - -    Assessment/Plan: Terry Hendrix is a 82 y.o. female with hematochezia. To this point, her hematochezia has been attributed to a  presumed LGIB secondary to diverticulosis. The frequency/volume of bleeding has decreased considerably since admission. However, she continues to have some bleeding. Her Hgb/Hct are stable. We discussed the need for close monitoring and ultimately to make a decision regarding colonoscopy vs. Watchful waiting. We will monitor for another 24 hours and if she continues to bleed she and Dr. Vira Agar can make a final decision regarding repeat colonoscopy tomorrow.  Recommendations:  - monitor Hgb/Hct - monitor hemodynamics - monitor for further bleeding - agree with clear liquid diet for now  Please call with questions or concerns.  Zyir Gassert L. Drema Dallas, MD, MPH

## 2016-05-06 NOTE — Progress Notes (Signed)
Patient has been alert and oriented. Still on clear liquids. IV fluids discontinued today. No complaints. Vitals are stable. Per GI patient will possibly have another colonoscopy. Hemoglobin is staying around 12 today. Will continue to monitor.

## 2016-05-07 LAB — CBC WITH DIFFERENTIAL/PLATELET
BASOS ABS: 0 10*3/uL (ref 0–0.1)
BASOS PCT: 0 %
BASOS PCT: 1 %
Basophils Absolute: 0 10*3/uL (ref 0–0.1)
Basophils Absolute: 0 10*3/uL (ref 0–0.1)
Basophils Relative: 1 %
EOS ABS: 0.2 10*3/uL (ref 0–0.7)
EOS ABS: 0.2 10*3/uL (ref 0–0.7)
EOS ABS: 0.2 10*3/uL (ref 0–0.7)
EOS PCT: 4 %
EOS PCT: 4 %
EOS PCT: 4 %
HCT: 35.5 % (ref 35.0–47.0)
HCT: 35.6 % (ref 35.0–47.0)
HCT: 36.5 % (ref 35.0–47.0)
HEMOGLOBIN: 11.7 g/dL — AB (ref 12.0–16.0)
HEMOGLOBIN: 12.1 g/dL (ref 12.0–16.0)
Hemoglobin: 11.7 g/dL — ABNORMAL LOW (ref 12.0–16.0)
LYMPHS ABS: 1 10*3/uL (ref 1.0–3.6)
LYMPHS ABS: 1.1 10*3/uL (ref 1.0–3.6)
Lymphocytes Relative: 18 %
Lymphocytes Relative: 14 %
Lymphocytes Relative: 17 %
Lymphs Abs: 0.8 10*3/uL — ABNORMAL LOW (ref 1.0–3.6)
MCH: 29.4 pg (ref 26.0–34.0)
MCH: 29.6 pg (ref 26.0–34.0)
MCH: 29.8 pg (ref 26.0–34.0)
MCHC: 32.9 g/dL (ref 32.0–36.0)
MCHC: 33.1 g/dL (ref 32.0–36.0)
MCHC: 32.9 g/dL (ref 32.0–36.0)
MCV: 90.6 fL (ref 80.0–100.0)
MCV: 89.2 fL (ref 80.0–100.0)
MCV: 89.4 fL (ref 80.0–100.0)
MONO ABS: 0.6 10*3/uL (ref 0.2–0.9)
MONOS PCT: 11 %
MONOS PCT: 9 %
Monocytes Absolute: 0.6 10*3/uL (ref 0.2–0.9)
Monocytes Absolute: 0.5 10*3/uL (ref 0.2–0.9)
Monocytes Relative: 9 %
NEUTROS PCT: 72 %
NEUTROS PCT: 71 %
Neutro Abs: 4.1 10*3/uL (ref 1.4–6.5)
Neutro Abs: 4.3 10*3/uL (ref 1.4–6.5)
Neutro Abs: 4.3 10*3/uL (ref 1.4–6.5)
Neutrophils Relative %: 67 %
PLATELETS: 178 10*3/uL (ref 150–440)
PLATELETS: 203 10*3/uL (ref 150–440)
Platelets: 176 10*3/uL (ref 150–440)
RBC: 3.92 MIL/uL (ref 3.80–5.20)
RBC: 3.99 MIL/uL (ref 3.80–5.20)
RBC: 4.08 MIL/uL (ref 3.80–5.20)
RDW: 14.4 % (ref 11.5–14.5)
RDW: 14.5 % (ref 11.5–14.5)
RDW: 14.7 % — ABNORMAL HIGH (ref 11.5–14.5)
WBC: 5.9 10*3/uL (ref 3.6–11.0)
WBC: 6 10*3/uL (ref 3.6–11.0)
WBC: 6.1 10*3/uL (ref 3.6–11.0)

## 2016-05-07 LAB — GLUCOSE, CAPILLARY: GLUCOSE-CAPILLARY: 114 mg/dL — AB (ref 65–99)

## 2016-05-07 MED ORDER — AMLODIPINE BESYLATE 10 MG PO TABS: 10.0000 mg | ORAL_TABLET | Freq: Every day | ORAL | Status: DC

## 2016-05-07 MED ORDER — HYDRALAZINE HCL 25 MG PO TABS: 25.0000 mg | ORAL_TABLET | Freq: Three times a day (TID) | ORAL | Status: DC

## 2016-05-07 NOTE — Progress Notes (Signed)
[facility] at [place] NAME: Terry Hendrix    MR#:  [id number]  DATE OF BIRTH:  18-Apr-1927  SUBJECTIVE: 82 year old admitted for rectal bleeding. Patient bleeding scan is negative.  Hemoglobin is Stable since yesterday at 11.7. Patient denies any further rectal bleeding. Tolerating the diet. Waiting for GI input. No  Abdominal pain.   CHIEF COMPLAINT:   Chief Complaint  Patient presents with  . Rectal Bleeding    REVIEW OF SYSTEMS:    Review of Systems  Constitutional: Negative for fever and chills.  HENT: Negative for hearing loss.   Eyes: Negative for blurred vision, double vision and photophobia.  Respiratory: Negative for cough, hemoptysis and shortness of breath.   Cardiovascular: Negative for palpitations, orthopnea and leg swelling.  Gastrointestinal: Negative for vomiting, abdominal pain and diarrhea.  Genitourinary: Negative for dysuria and urgency.  Musculoskeletal: Negative for myalgias and neck pain.  Skin: Negative for rash.  Neurological: Negative for dizziness, focal weakness, seizures, weakness and headaches.  Psychiatric/Behavioral: Negative for memory loss. The patient does not have insomnia.     Nutrition:  Tolerating Diet: Tolerating PT:      DRUG ALLERGIES:  No Known Allergies  VITALS:  Blood pressure 132/81, pulse 71, temperature 97.8 F (36.6 C), temperature source Oral, resp. rate 20, height 5\' 4"  (1.626 m), weight 71.033 kg (156 lb 9.6 oz), SpO2 100 %.  PHYSICAL EXAMINATION:   Physical Exam  GENERAL:  82 y.o.-year-old patient lying in the bed with no acute distress.  EYES: Pupils equal, round, reactive to light and accommodation. No scleral icterus. Extraocular muscles intact.  HEENT: Head atraumatic, normocephalic. Oropharynx and nasopharynx clear.  NECK:  Supple, no jugular venous distention. No thyroid enlargement, no tenderness.  LUNGS: Normal breath sounds bilaterally, no wheezing,  rales,rhonchi or crepitation. No use of accessory muscles of respiration.  CARDIOVASCULAR: S1, S2 normal. No murmurs, rubs, or gallops.  ABDOMEN: Soft, nontender, nondistended. Bowel sounds present. No organomegaly or mass.  EXTREMITIES: No pedal edema, cyanosis, or clubbing.  NEUROLOGIC: Cranial nerves II through XII are intact. Muscle strength 5/5 in all extremities. Sensation intact. Gait not checked.  PSYCHIATRIC: The patient is alert and oriented x 3.  SKIN: No obvious rash, lesion, or ulcer.    LABORATORY PANEL:   CBC  Recent Labs Lab 05/07/16 0710  WBC 5.9  HGB 11.7*  HCT 35.6  PLT 178   ------------------------------------------------------------------------------------------------------------------  Chemistries   Recent Labs Lab 05/04/16 1037  NA 138  K 4.2  CL 107  CO2 24  GLUCOSE 106*  BUN 21*  CREATININE 1.22*  CALCIUM 9.5  AST 19  ALT 14  ALKPHOS 39  BILITOT 0.6   ------------------------------------------------------------------------------------------------------------------  Cardiac Enzymes No results for input(s): TROPONINI in the last 168 hours. ------------------------------------------------------------------------------------------------------------------  RADIOLOGY:  No results found.   ASSESSMENT AND PLAN:   Active Problems:   GIB (gastrointestinal bleeding)   Atrial fibrillation (HCC)   #1 GI bleed possibly episode of diverticular bleed/PUD:     bleeding scan also is negative. Seen by gastroenterology.  Appreciate  GI follow-up for discharging the patient. Hemoglobin stable at 11.7. #2 malignant essential hypertension. Started  the patient on small dose hydralazine 25 mg by mouth 3 times a day.   Blood pressure is better   3 hyperlipidemia continue statins. #4 history of anxiety continue Paxil.  D/w son.  All the records are reviewed and case discussed with Care Management/Social Workerr. Management plans discussed with the  patient, family and they are in agreement.  CODE STATUS: full  TOTAL TIME TAKING CARE OF THIS PATIENT: 35  minutes.   POSSIBLE D/C IN 1-2DAYS, DEPENDING ON CLINICAL CONDITION.   Epifanio Lesches M.D on 05/07/2016 at 11:39 AM  Between 7am to 6pm - Pager - [phone]  After 6pm go to [email or web address] - password [email or web address] [facility]  Office  [phone]  CC: Primary care physician; Petra Kuba, MD

## 2016-05-07 NOTE — Consult Note (Signed)
Given she is still passing some blood with bowel movements I would follow her in the hospital one more day, can advance diet to full liquids.

## 2016-05-08 ENCOUNTER — Inpatient Hospital Stay: Payer: Medicare Other

## 2016-05-08 LAB — CBC WITH DIFFERENTIAL/PLATELET
BASOS ABS: 0 10*3/uL (ref 0–0.1)
BASOS ABS: 0 10*3/uL (ref 0–0.1)
Basophils Absolute: 0 10*3/uL (ref 0–0.1)
Basophils Relative: 1 %
Basophils Relative: 1 %
Basophils Relative: 1 %
EOS ABS: 0.2 10*3/uL (ref 0–0.7)
EOS ABS: 0.2 10*3/uL (ref 0–0.7)
EOS PCT: 4 %
EOS PCT: 3 %
Eosinophils Absolute: 0.2 10*3/uL (ref 0–0.7)
Eosinophils Relative: 4 %
HCT: 37.7 % (ref 35.0–47.0)
HCT: 35.5 % (ref 35.0–47.0)
HEMATOCRIT: 34 % — AB (ref 35.0–47.0)
Hemoglobin: 11.2 g/dL — ABNORMAL LOW (ref 12.0–16.0)
Hemoglobin: 12.7 g/dL (ref 12.0–16.0)
Hemoglobin: 11.6 g/dL — ABNORMAL LOW (ref 12.0–16.0)
LYMPHS ABS: 1.1 10*3/uL (ref 1.0–3.6)
LYMPHS ABS: 0.8 10*3/uL — AB (ref 1.0–3.6)
LYMPHS PCT: 18 %
Lymphocytes Relative: 15 %
Lymphocytes Relative: 14 %
Lymphs Abs: 0.8 10*3/uL — ABNORMAL LOW (ref 1.0–3.6)
MCH: 29.4 pg (ref 26.0–34.0)
MCH: 29.9 pg (ref 26.0–34.0)
MCH: 29.6 pg (ref 26.0–34.0)
MCHC: 33 g/dL (ref 32.0–36.0)
MCHC: 33.6 g/dL (ref 32.0–36.0)
MCHC: 32.6 g/dL (ref 32.0–36.0)
MCV: 89.2 fL (ref 80.0–100.0)
MCV: 89 fL (ref 80.0–100.0)
MCV: 90.9 fL (ref 80.0–100.0)
MONO ABS: 0.6 10*3/uL (ref 0.2–0.9)
MONO ABS: 0.4 10*3/uL (ref 0.2–0.9)
MONO ABS: 0.6 10*3/uL (ref 0.2–0.9)
MONOS PCT: 8 %
Monocytes Relative: 10 %
Monocytes Relative: 10 %
NEUTROS ABS: 4.2 10*3/uL (ref 1.4–6.5)
Neutro Abs: 4.3 10*3/uL (ref 1.4–6.5)
Neutro Abs: 4.4 10*3/uL (ref 1.4–6.5)
Neutrophils Relative %: 67 %
Neutrophils Relative %: 74 %
Neutrophils Relative %: 72 %
PLATELETS: 203 10*3/uL (ref 150–440)
PLATELETS: 189 10*3/uL (ref 150–440)
Platelets: 189 10*3/uL (ref 150–440)
RBC: 3.81 MIL/uL (ref 3.80–5.20)
RBC: 4.23 MIL/uL (ref 3.80–5.20)
RBC: 3.9 MIL/uL (ref 3.80–5.20)
RDW: 14.5 % (ref 11.5–14.5)
RDW: 14.6 % — ABNORMAL HIGH (ref 11.5–14.5)
RDW: 15.1 % — AB (ref 11.5–14.5)
WBC: 6.2 10*3/uL (ref 3.6–11.0)
WBC: 5.8 10*3/uL (ref 3.6–11.0)
WBC: 6 10*3/uL (ref 3.6–11.0)

## 2016-05-08 LAB — URINALYSIS COMPLETE WITH MICROSCOPIC (ARMC ONLY)
BILIRUBIN URINE: NEGATIVE
GLUCOSE, UA: NEGATIVE mg/dL
KETONES UR: NEGATIVE mg/dL
Leukocytes, UA: NEGATIVE
Nitrite: NEGATIVE
Protein, ur: 100 mg/dL — AB
SPECIFIC GRAVITY, URINE: 1.018 (ref 1.005–1.030)
pH: 5 (ref 5.0–8.0)

## 2016-05-08 MED ORDER — DEXTROSE 5 % IV SOLN
1.0000 g | INTRAVENOUS | Status: DC
  Administered 2016-05-09: 1 g via INTRAVENOUS
  Filled 2016-05-08 (×2): qty 10

## 2016-05-08 MED ORDER — DEXTROSE 5 % IV SOLN
1.0000 g | Freq: Once | INTRAVENOUS | Status: AC
  Administered 2016-05-08: 1 g via INTRAVENOUS
  Filled 2016-05-08: qty 10

## 2016-05-08 NOTE — Care Management Important Message (Signed)
Important Message  Patient Details  Name: Terry Hendrix MRN: [id number] Date of Birth: July 20, 1927   Medicare Important Message Given:  Yes    Jolly Mango, RN 05/08/2016, 8:33 AM

## 2016-05-08 NOTE — Progress Notes (Signed)
[name] at [place] NAME: Terry Hendrix    MR#:  [id number]  DATE OF BIRTH:  Oct 09, 1927  SUBJECTIVE: 82 year old admitted for rectal bleeding. Patient bleeding scan is negative.  Hemoglobin is Stable since yesterday at 11.7. Son noted hematuria.but I was not informed till today.pt had no fever ,no wbc.so no reason to susoect UTI.till today .son  Is very unhappy that she had UTI and not found out.  CHIEF COMPLAINT:   Chief Complaint  Patient presents with  . Rectal Bleeding    REVIEW OF SYSTEMS:    Review of Systems  Constitutional: Negative for fever and chills.  HENT: Negative for hearing loss.   Eyes: Negative for blurred vision, double vision and photophobia.  Respiratory: Negative for cough, hemoptysis and shortness of breath.   Cardiovascular: Negative for palpitations, orthopnea and leg swelling.  Gastrointestinal: Negative for vomiting, abdominal pain and diarrhea.  Genitourinary: Negative for dysuria and urgency.  Musculoskeletal: Negative for myalgias and neck pain.  Skin: Negative for rash.  Neurological: Negative for dizziness, focal weakness, seizures, weakness and headaches.  Psychiatric/Behavioral: Negative for memory loss. The patient does not have insomnia.     Nutrition:  Tolerating Diet: Tolerating PT:      DRUG ALLERGIES:  No Known Allergies  VITALS:  Blood pressure 119/58, pulse 75, temperature 98.8 F (37.1 C), temperature source Oral, resp. rate 16, height 5\' 4"  (1.626 m), weight 71.033 kg (156 lb 9.6 oz), SpO2 97 %.  PHYSICAL EXAMINATION:   Physical Exam  GENERAL:  82 y.o.-year-old patient  Walked with walker. EYES: Pupils equal, round, reactive to light and accommodation. No scleral icterus. Extraocular muscles intact.  HEENT: Head atraumatic, normocephalic. Oropharynx and nasopharynx clear.  NECK:  Supple, no jugular venous distention. No thyroid enlargement, no tenderness.  LUNGS: Normal  breath sounds bilaterally, no wheezing, rales,rhonchi or crepitation. No use of accessory muscles of respiration.  CARDIOVASCULAR: S1, S2 normal. No murmurs, rubs, or gallops.  ABDOMEN: Soft, nontender, nondistended. Bowel sounds present. No organomegaly or mass.  EXTREMITIES: No pedal edema, cyanosis, or clubbing.  NEUROLOGIC: Cranial nerves II through XII are intact. Muscle strength 5/5 in all extremities. Sensation intact. Gait not checked.  PSYCHIATRIC: The patient is alert and oriented x 3.  SKIN: No obvious rash, lesion, or ulcer.    LABORATORY PANEL:   CBC  Recent Labs Lab 05/08/16 0920  WBC 5.8  HGB 12.7  HCT 37.7  PLT 203   ------------------------------------------------------------------------------------------------------------------  Chemistries   Recent Labs Lab 05/04/16 1037  NA 138  K 4.2  CL 107  CO2 24  GLUCOSE 106*  BUN 21*  CREATININE 1.22*  CALCIUM 9.5  AST 19  ALT 14  ALKPHOS 39  BILITOT 0.6   ------------------------------------------------------------------------------------------------------------------  Cardiac Enzymes No results for input(s): TROPONINI in the last 168 hours. ------------------------------------------------------------------------------------------------------------------  RADIOLOGY:  No results found.   ASSESSMENT AND PLAN:   Active Problems:   GIB (gastrointestinal bleeding)   Atrial fibrillation (HCC)   #1 GI bleed possibly episode of diverticular bleed/PUD:     bleeding scan also is negative. Seen by gastroenterology.  Appreciate  GI follow-up f #2 malignant essential hypertension. Started  the patient on small dose hydralazine 25 mg by mouth 3 times a day.   Blood pressure is better   3 hyperlipidemia continue statins. #4 history of anxiety continue Paxil. 5.hematuria due to UTI;started  On Rocephin,check urine cultures. Keep her today to make sure her hematuria clears.and get  pelvic sono to look for  uterus/bladder. Son also concerned if she is having vaginal bleeding.but RN did not report hematuria or vaginal bleeding to me till today.   All the records are reviewed and case discussed with Care Management/Social Workerr. Management plans discussed with the patient, family and they are in agreement.  CODE STATUS: full  TOTAL TIME TAKING CARE OF THIS PATIENT: 35  minutes.   POSSIBLE D/C IN 1-2DAYS, DEPENDING ON CLINICAL CONDITION.   Epifanio Lesches M.D on 05/08/2016 at 3:47 PM  Between 7am to 6pm - Pager - [phone]  After 6pm go to [email or web address] - password [email or web address] [facility]  Office  [phone]  CC: Primary care physician; Petra Kuba, MD

## 2016-05-08 NOTE — Progress Notes (Signed)
Patient ambulated around the nursing station with a standard walker on room air.  Tolerated well.

## 2016-05-08 NOTE — Consult Note (Signed)
Pt hgb stable, VSS, she saw a faint spot of blood on her stool.  She says she has not been ambulated as of yet.  I spoke to her Hospitalist about this.  I will sign off as her bleeding has stopped.

## 2016-05-08 NOTE — Progress Notes (Signed)
Chaplain rounded the unit and provided a compassionate presence and support to the patient. Chaplain Sierrah Luevano [phone] 

## 2016-05-08 NOTE — Progress Notes (Signed)
Patient is up to the bathroom with walker. Patient's urine is clear with blood clots present. MD has been made aware. Patient has no c/o of pain, takes meds whole, O2 sats above 92% on room air. VSS. Will continue to monitor. Terry Hendrix

## 2016-05-08 NOTE — Progress Notes (Signed)
Patient's son does not agree with discharge. Patient is still having bloody urine and blood clots. Notified Dr. Vianne Bulls. UA has been collected, new orders for IV abx. Per MD okay to advance diet to a regular diet. Will continue to monitor. Horton Finer

## 2016-05-09 ENCOUNTER — Encounter: Payer: Self-pay | Admitting: Obstetrics and Gynecology

## 2016-05-09 LAB — CBC WITH DIFFERENTIAL/PLATELET
BASOS ABS: 0 10*3/uL (ref 0–0.1)
BASOS PCT: 0 %
Basophils Absolute: 0.1 10*3/uL (ref 0–0.1)
Basophils Absolute: 0 10*3/uL (ref 0–0.1)
Basophils Relative: 1 %
EOS ABS: 0.2 10*3/uL (ref 0–0.7)
EOS PCT: 2 %
Eosinophils Absolute: 0.1 10*3/uL (ref 0–0.7)
Eosinophils Absolute: 0.1 10*3/uL (ref 0–0.7)
HCT: 34.9 % — ABNORMAL LOW (ref 35.0–47.0)
HCT: 36.9 % (ref 35.0–47.0)
HEMATOCRIT: 36.4 % (ref 35.0–47.0)
HEMOGLOBIN: 11.4 g/dL — AB (ref 12.0–16.0)
Hemoglobin: 12.1 g/dL (ref 12.0–16.0)
Hemoglobin: 12.2 g/dL (ref 12.0–16.0)
LYMPHS ABS: 0.9 10*3/uL — AB (ref 1.0–3.6)
LYMPHS PCT: 13 %
Lymphocytes Relative: 16 %
Lymphs Abs: 1.1 10*3/uL (ref 1.0–3.6)
Lymphs Abs: 0.8 10*3/uL — ABNORMAL LOW (ref 1.0–3.6)
MCH: 29.6 pg (ref 26.0–34.0)
MCH: 29.6 pg (ref 26.0–34.0)
MCH: 29.9 pg (ref 26.0–34.0)
MCHC: 32.7 g/dL (ref 32.0–36.0)
MCHC: 33.3 g/dL (ref 32.0–36.0)
MCHC: 33.1 g/dL (ref 32.0–36.0)
MCV: 90.6 fL (ref 80.0–100.0)
MCV: 88.7 fL (ref 80.0–100.0)
MCV: 90.5 fL (ref 80.0–100.0)
MONO ABS: 0.5 10*3/uL (ref 0.2–0.9)
MONO ABS: 0.6 10*3/uL (ref 0.2–0.9)
MONOS PCT: 10 %
Monocytes Absolute: 0.7 10*3/uL (ref 0.2–0.9)
Monocytes Relative: 7 %
Neutro Abs: 5.1 10*3/uL (ref 1.4–6.5)
Neutro Abs: 5.6 10*3/uL (ref 1.4–6.5)
Neutro Abs: 4.6 10*3/uL (ref 1.4–6.5)
Neutrophils Relative %: 78 %
Neutrophils Relative %: 75 %
PLATELETS: 186 10*3/uL (ref 150–440)
PLATELETS: 197 10*3/uL (ref 150–440)
PLATELETS: 187 10*3/uL (ref 150–440)
RBC: 3.85 MIL/uL (ref 3.80–5.20)
RBC: 4.11 MIL/uL (ref 3.80–5.20)
RBC: 4.08 MIL/uL (ref 3.80–5.20)
RDW: 14.8 % — AB (ref 11.5–14.5)
RDW: 14.8 % — AB (ref 11.5–14.5)
RDW: 14.5 % (ref 11.5–14.5)
WBC: 7.2 10*3/uL (ref 3.6–11.0)
WBC: 7.1 10*3/uL (ref 3.6–11.0)
WBC: 6.2 10*3/uL (ref 3.6–11.0)

## 2016-05-09 LAB — URINE CULTURE

## 2016-05-09 MED ORDER — MEDROXYPROGESTERONE ACETATE 10 MG PO TABS
10.0000 mg | ORAL_TABLET | Freq: Every day | ORAL | Status: DC
  Administered 2016-05-09 – 2016-05-10 (×2): 10 mg via ORAL
  Filled 2016-05-09 (×3): qty 1

## 2016-05-09 MED ORDER — PANTOPRAZOLE SODIUM 40 MG PO TBEC
40.0000 mg | DELAYED_RELEASE_TABLET | Freq: Two times a day (BID) | ORAL | Status: DC
  Administered 2016-05-09 – 2016-05-10 (×2): 40 mg via ORAL
  Filled 2016-05-09 (×2): qty 1

## 2016-05-09 NOTE — Consult Note (Signed)
GYNECOLOGY INPATIENT CONSULT NOTE  Reason for Consult:Post-menopausal bleeding (PMB) Referring Physician:   HPI: Terry Hendrix is an 82 y.o. P2002 postmenopausal female who was admitted on 05/04/2016 with complaints of suspected GI (rectal) bleeding, with associated symptoms of lightheadedness and dizziness.  Patient noted several episodes with BMs.  Of note, patient has She experienced similar symptoms in 2014 requiring arteriogram and embolization of active hemorrhoids in proximal ascending colon.  She has recently undergone GI workup this admission with no evidence of bleeding.  Patient also began noting frank hematuria this admission. UA normal except 3+ Hgb, culture pending. In addition, patient had pelvic ultrasound performed yesterday which noted EM stripe of 6 mm, but otherwise unremarkable.  Today notes light spotting, no heavy bleeding or passage of clots.  .   Pertinent Gynecological History: Menses: post-menopausal Bleeding: post menopausal bleeding DES exposure: unknown Blood transfusions: none Sexually transmitted diseases: no past history Previous GYN Procedures: none  Last pap smear over 20 years ago, normal, no h/o abnormal paps. Last mammogram over 15 years ago, normal, no h/o abnormal paps. Last colonoscopy/sigmoidoscopy 2014.    Menstrual History: Menarche age: ~ 73 No LMP recorded. Patient is postmenopausal.    Past Medical History  Diagnosis Date  . MDD (major depressive disorder) ([place])   . Hypertension   . Glaucoma   . HLD (hyperlipidemia)     Past Surgical History  Procedure Laterality Date  . Colonic embolization  2014    History reviewed. No pertinent family history.  Social History   Social History  . Marital Status: Widowed    Spouse Name: N/A  . Number of Children: N/A  . Years of Education: N/A   Occupational History  . Not on file.   Social History Main Topics  . Smoking status: Never Smoker   . Smokeless tobacco: Not on file  .  Alcohol Use: No  . Drug Use: Not on file  . Sexual Activity: Not on file   Other Topics Concern  . Not on file   Social History Narrative  . No narrative on file      Medication List    TAKE these medications        amLODipine 10 MG tablet  Commonly known as:  NORVASC  Take 1 tablet (10 mg total) by mouth daily.     atorvastatin 40 MG tablet  Commonly known as:  LIPITOR  Take 40 mg by mouth at bedtime.     hydrALAZINE 25 MG tablet  Commonly known as:  APRESOLINE  Take 1 tablet (25 mg total) by mouth every 8 (eight) hours.     pantoprazole 40 MG tablet  Commonly known as:  PROTONIX  Take 40 mg by mouth daily.     PARoxetine 10 MG tablet  Commonly known as:  PAXIL  Take 10 mg by mouth daily.     travoprost (benzalkonium) 0.004 % ophthalmic solution  Commonly known as:  TRAVATAN  Place 1 drop into both eyes at bedtime.        Allergies: No Known Allergies    Review of Systems  Constitutional: Negative for fever, chills and weight loss.  HENT: Negative for congestion and tinnitus.   Eyes: Negative for blurred vision and pain.  Respiratory: Negative for cough and shortness of breath.   Cardiovascular: Negative for chest pain and palpitations.  Gastrointestinal: Positive for blood in stool. Negative for nausea, vomiting and abdominal pain.  Genitourinary: Positive for hematuria. Negative for dysuria, urgency, frequency and  flank pain.  Musculoskeletal: Negative for back pain, joint pain and neck pain.  Skin: Negative for itching and rash.  Neurological: Negative for dizziness, seizures, loss of consciousness and headaches.  Endo/Heme/Allergies: Does not bruise/bleed easily.  Psychiatric/Behavioral: Negative for depression. The patient does not have insomnia.     Blood pressure 136/68, pulse 80, temperature 98.4 F (36.9 C), temperature source Oral, resp. rate 16, height 5\' 4"  (1.626 m), weight 156 lb 9.6 oz (71.033 kg), SpO2 99 %. Physical Exam   Constitutional: She is oriented to person, place, and time. She appears well-developed and well-nourished.  HENT:  Head: Normocephalic and atraumatic.  Right Ear: External ear normal.  Left Ear: External ear normal.  Nose: Nose normal.  Mouth/Throat: Oropharynx is clear and moist.  Eyes: Conjunctivae and EOM are normal. Pupils are equal, round, and reactive to light. No scleral icterus.  Neck: Normal range of motion. Neck supple. No JVD present. No tracheal deviation present. No thyromegaly present.  Cardiovascular: Normal rate, regular rhythm and normal heart sounds.   Respiratory: Effort normal and breath sounds normal. No respiratory distress.  GI: Soft. Bowel sounds are normal. She exhibits no distension and no mass. There is no tenderness. There is no rebound and no guarding.  Genitourinary: Vagina normal and uterus normal. No vaginal discharge found.  Small amount of dark red blood in vaginal vault. No vaginal lesions.  Atrophy present.  Cervix slightly flushed to vaginal wall, no lesions, no CMT.  Uterus normal size, shape, consistency, mobile.   Musculoskeletal: Normal range of motion. She exhibits no edema or tenderness.  Lymphadenopathy:    She has no cervical adenopathy.  Neurological: She is alert and oriented to person, place, and time.  Skin: Skin is warm and dry. No rash noted. No erythema.  Psychiatric: She has a normal mood and affect. Her behavior is normal.     Labs:  CBC Latest Ref Rng 05/09/2016 05/09/2016 05/09/2016  WBC 3.6 - 11.0 K/uL 6.2 7.1 7.2  Hemoglobin 12.0 - 16.0 g/dL 12.2 12.1 11.4(L)  Hematocrit 35.0 - 47.0 % 36.9 36.4 34.9(L)  Platelets 150 - 440 K/uL 187 197 186     Results for orders placed or performed during the hospital encounter of 05/04/16 (from the past 48 hour(s))  Urinalysis complete, with microscopic ([facility] only)     Status: Abnormal   Collection Time: 05/08/16  1:20 PM  Result Value Ref Range   Color, Urine RED (A) YELLOW   APPearance  CLOUDY (A) CLEAR   Glucose, UA NEGATIVE NEGATIVE mg/dL   Bilirubin Urine NEGATIVE NEGATIVE   Ketones, ur NEGATIVE NEGATIVE mg/dL   Specific Gravity, Urine 1.018 1.005 - 1.030   Hgb urine dipstick 3+ (A) NEGATIVE   pH 5.0 5.0 - 8.0   Protein, ur 100 (A) NEGATIVE mg/dL   Nitrite NEGATIVE NEGATIVE   Leukocytes, UA NEGATIVE NEGATIVE   RBC / HPF TOO NUMEROUS TO COUNT 0 - 5 RBC/hpf   WBC, UA 6-30 0 - 5 WBC/hpf   Bacteria, UA MANY (A) NONE SEEN   Squamous Epithelial / LPF 6-30 (A) NONE SEEN   Mucous PRESENT   Urine culture     Status: Abnormal   Collection Time: 05/08/16  1:20 PM  Result Value Ref Range   Specimen Description URINE, RANDOM    Special Requests NONE    Culture MULTIPLE SPECIES PRESENT, SUGGEST RECOLLECTION (A)    Report Status 05/09/2016 FINAL     US Pelvis Complete  05/08/2016  CLINICAL DATA:  Hematuria versus vaginal bleeding EXAM: TRANSABDOMINAL ULTRASOUND OF PELVIS TECHNIQUE: Transabdominal ultrasound examination of the pelvis was performed including evaluation of the uterus, ovaries, adnexal regions, and pelvic cul-de-sac. COMPARISON:  None. FINDINGS: Uterus Measurements: 7.7 x 3.8 x 4.7 cm. No fibroids or other mass visualized. Endometrium Thickness: 6 mm.  No focal abnormality visualized. Right ovary Not discretely visualized. Left ovary Not discretely visualized. Other findings:  No abnormal free fluid. IMPRESSION: Endometrial complex measures 6 mm. If this patient does not have vaginal bleeding, the endometrial complex would be considered within normal limits. However, if this patient has true post-menopausal bleeding, endometrial sampling is indicated to exclude carcinoma. If results are benign, sonohysterogram should be considered for focal lesion work-up. (Ref: Radiological Reasoning: Algorithmic Workup of Abnormal Vaginal Bleeding with Endovaginal Sonography and Sonohysterography. AJR [id number]) Electronically Signed   By: Julian Hy M.D.   On: 05/08/2016  18:00   US Pelvis Limited  05/08/2016  CLINICAL DATA:  Hematuria versus vaginal bleeding, evaluate bladder EXAM: LIMITED ULTRASOUND OF PELVIS TECHNIQUE: Limited transabdominal ultrasound examination of the pelvis was performed. COMPARISON:  None. FINDINGS: Bladder is within normal limits. Bilateral bladder jets are visualized. IMPRESSION: Bladder is within normal limits. Electronically Signed   By: Julian Hy M.D.   On: 05/08/2016 18:00    Assessment/Plan: 1. Postmenopausal bleeding.  Vagina/uterus is noted site of bleeding.   Patient has undergone workup during current admission, ruled out for GI bleed.  UA with 3+ Hgb, and patient noting frank hematuria, but denies urinary symptoms.  Culture with multiple species present (non-specific).  Would recommend recollection. Source of blood in urine is still likely from vagina. Blood visible in vaginal vault today.  Otherwise benign pelvic exam (no masses seen or palpated). Review of ultrasound notes EM stripe of 6 mm (upper limits of normal).  In light of vaginal bleeding, would recommend endometrial biopsy, however this can be performed as outpatient.  Bleeding may be secondary to postmenopausal vaginal/endometrial atrophy.  No evidence of anemia noted (despite patient's bleeding).  Would recommend therapy with Provera 10 mg x 7-10 days to help wit bleeding.  Can perform biopsy in office sometime next week.   2. Discussed ultrasound and physical exam findings with patient, including differential diagnosis.  Patient notes understanding.  Also discussed with patient's son (whom I contacted by phone after patient's evaluation to update status).  Patient's son initially desired transfer to [facility], however no beds evident.  I do believe that patient's symptoms can be managed as outpatient, and discussed with son.  If patient is able to d/c home tomorrow, son will no longer desire transfer. Also discussed all with referring physician.     Thank you for the  consult.  Patient can f/u in 7-10 days in my clinic to f/u PMB for further evaluation and workup.     Rubie Maid, MD [facility] [phone] (pager) 05/09/2016

## 2016-05-09 NOTE — Consult Note (Signed)
GI Inpatient Follow-up Note  Patient Identification: Terry Hendrix is a 82 y.o. female admitted on 05/04/2016 for several episodes BRB with BMs that morning.  Associated symptoms included dizziness and lightheandedness.  She experienced similar symptoms in 2014 requiring arteriogram and embolization of active hemorrhoids in proximal ascending colon.  NM bleeding scan on 5/26 returned negative.  Patient reported "a spot of blood in stool" yesterday, but also notes frank hematuria.  Hgb 3+ on UA, culture pending.  External GYN exam demonstrated friable mucosa.  Pelvic US showed 67mm endomentrium, bladder US normal.  Hgb now stable at 12.1.  Subjective: Terry Hendrix denies further blood with BMs last night or this morning.  She notes a "light pink color" on the toilet tissue and in the commode water.  This occurred with urination alone as well.  She is tolerating oral intake well.  No new concerns today.  Son present at bedside as well, no questions at this time.  Scheduled Inpatient Medications:  . amLODipine  10 mg Oral Daily  . cefTRIAXone (ROCEPHIN)  IV  1 g Intravenous Q24H  . hydrALAZINE  25 mg Oral Q8H  . latanoprost  1 drop Both Eyes QHS  . pantoprazole (PROTONIX) IV  40 mg Intravenous Q12H  . PARoxetine  10 mg Oral Daily  . sodium chloride flush  3 mL Intravenous Q12H    Continuous Inpatient Infusions:     PRN Inpatient Medications:  acetaminophen **OR** acetaminophen, hydrALAZINE, labetalol, ondansetron **OR** ondansetron (ZOFRAN) IV, senna-docusate  Review of Systems: Constitutional: Weight is stable.  Eyes: No changes in vision. ENT: No oral lesions, sore throat.  GI: see HPI.  Heme/Lymph: No easy bruising.  CV: No chest pain.  GU: No hematuria.  Integumentary: No rashes.  Neuro: No headaches.  Psych: No depression/anxiety.  Endocrine: No heat/cold intolerance.  Allergic/Immunologic: No urticaria.  Resp: No cough, SOB.  Musculoskeletal: No joint swelling.    Physical  Examination: BP 136/68 mmHg  Pulse 80  Temp(Src) 98.4 F (36.9 C) (Oral)  Resp 16  Ht 5\' 4"  (1.626 m)  Wt 71.033 kg (156 lb 9.6 oz)  BMI 26.87 kg/m2  SpO2 99% Gen: NAD, alert and oriented x 4 HEENT: PEERLA, EOMI, Neck: supple, no JVD or thyromegaly Chest: CTA bilaterally, no wheezes, crackles, or other adventitious sounds CV: RRR, no m/g/c/r Abd: soft, NT, ND, +BS in all four quadrants; no HSM, guarding, ridigity, or rebound tenderness Ext: no edema, well perfused with 2+ pulses, Skin: no rash or lesions noted Rectal: small external hemorrhoids present, no fissures, brown stool in rectal vault; FOBT heme negative Lymph: no LAD  Data: Lab Results  Component Value Date   WBC 7.1 05/09/2016   HGB 12.1 05/09/2016   HCT 36.4 05/09/2016   MCV 88.7 05/09/2016   PLT 197 05/09/2016    Recent Labs Lab 05/08/16 1855 05/09/16 0121 05/09/16 1010  HGB 11.6* 11.4* 12.1   Lab Results  Component Value Date   NA 138 05/04/2016   K 4.2 05/04/2016   CL 107 05/04/2016   CO2 24 05/04/2016   BUN 21* 05/04/2016   CREATININE 1.22* 05/04/2016   Lab Results  Component Value Date   ALT 14 05/04/2016   AST 19 05/04/2016   ALKPHOS 39 05/04/2016   BILITOT 0.6 05/04/2016    Recent Labs Lab 05/04/16 1037  INR 1.13   Assessment/Plan: Terry Hendrix is a 82 y.o. female admitted with lower GI bleeding.  NM bleeding scan negative.  No further blood with  BMs x 2.  Patient now experiencing likely hematuria, with 3+ Hgb on dipstick.  Hgb stable at 12.1.  FOBT obtained today negative. No additional GI interventions indicated at this time - GI to sign off.  Please re-consult if new concerns arise.  Recommendations: - GI signing off - please re-consult prn  Patient has been discussed with Dr. Vira Agar, pending further GI recommendations at this time. Please call with questions or concerns.  Lavera Guise, PA-C [facility] Phone: [phone] Pager: [phone]

## 2016-05-09 NOTE — Progress Notes (Signed)
Patient son request that patient to be transfer to [facility]. Notified Dr. Vianne Bulls. Educated patient on gyn/ob consult and MD has 24 hours to respond. Per Dr. Vianne Bulls Dr. Marcelline Mates will come see the patient today. Will continue to monitor. Horton Finer

## 2016-05-09 NOTE — Progress Notes (Addendum)
[place] [facility] [place] NAME: Terry Hendrix    MR#:  [id number]  DATE OF BIRTH:  03/13/1927  SUBJECTIVE: 82 year old admitted for rectal bleeding. Patient bleeding scan is negative.  Hemoglobin is Stable . Urinalysis showed many bacteria and cloudy urine.concerned about vaginal bleeding also. Pt had ultrasound of uterus and bladder, suprapubic pain present   CHIEF COMPLAINT:   Chief Complaint  Patient presents with  . Rectal Bleeding    REVIEW OF SYSTEMS:    Review of Systems  Constitutional: Negative for fever and chills.  HENT: Negative for hearing loss.   Eyes: Negative for blurred vision, double vision and photophobia.  Respiratory: Negative for cough, hemoptysis and shortness of breath.   Cardiovascular: Negative for palpitations, orthopnea and leg swelling.  Gastrointestinal: Negative for vomiting, abdominal pain and diarrhea.  Genitourinary: Positive for urgency and hematuria. Negative for dysuria.  Musculoskeletal: Negative for myalgias and neck pain.  Skin: Negative for rash.  Neurological: Negative for dizziness, focal weakness, seizures, weakness and headaches.  Psychiatric/Behavioral: Negative for memory loss. The patient does not have insomnia.     Nutrition:  Tolerating Diet: Tolerating PT:      DRUG ALLERGIES:  No Known Allergies  VITALS:  Blood pressure 155/84, pulse 77, temperature 98.6 F (37 C), temperature source Oral, resp. rate 16, height 5\' 4"  (1.626 m), weight 71.033 kg (156 lb 9.6 oz), SpO2 94 %.  PHYSICAL EXAMINATION:   Physical Exam  GENERAL:  82 y.o.-year-old patient  Walked with walker. EYES: Pupils equal, round, reactive to light and accommodation. No scleral icterus. Extraocular muscles intact.  HEENT: Head atraumatic, normocephalic. Oropharynx and nasopharynx clear.  NECK:  Supple, no jugular venous distention. No thyroid enlargement, no tenderness.  LUNGS: Normal breath sounds  bilaterally, no wheezing, rales,rhonchi or crepitation. No use of accessory muscles of respiration.  CARDIOVASCULAR: S1, S2 normal. No murmurs, rubs, or gallops.  ABDOMEN: Soft, nontender, nondistended. Bowel sounds present. No organomegaly or mass. Mild suprapubic tenderness present EXTREMITIES: No pedal edema, cyanosis, or clubbing.  NEUROLOGIC: Cranial nerves II through XII are intact. Muscle strength 5/5 in all extremities. Sensation intact. Gait not checked.  PSYCHIATRIC: The patient is alert and oriented x 3.  SKIN: No obvious rash, lesion, or ulcer.    LABORATORY PANEL:   CBC  Recent Labs Lab 05/09/16 0121  WBC 7.2  HGB 11.4*  HCT 34.9*  PLT 186   ------------------------------------------------------------------------------------------------------------------  Chemistries   Recent Labs Lab 05/04/16 1037  NA 138  K 4.2  CL 107  CO2 24  GLUCOSE 106*  BUN 21*  CREATININE 1.22*  CALCIUM 9.5  AST 19  ALT 14  ALKPHOS 39  BILITOT 0.6   ------------------------------------------------------------------------------------------------------------------  Cardiac Enzymes No results for input(s): TROPONINI in the last 168 hours. ------------------------------------------------------------------------------------------------------------------  RADIOLOGY:  US Pelvis Complete  05/08/2016  CLINICAL DATA:  Hematuria versus vaginal bleeding EXAM: TRANSABDOMINAL ULTRASOUND OF PELVIS TECHNIQUE: Transabdominal ultrasound examination of the pelvis was performed including evaluation of the uterus, ovaries, adnexal regions, and pelvic cul-de-sac. COMPARISON:  None. FINDINGS: Uterus Measurements: 7.7 x 3.8 x 4.7 cm. No fibroids or other mass visualized. Endometrium Thickness: 6 mm.  No focal abnormality visualized. Right ovary Not discretely visualized. Left ovary Not discretely visualized. Other findings:  No abnormal free fluid. IMPRESSION: Endometrial complex measures 6 mm. If this  patient does not have vaginal bleeding, the endometrial complex would be considered within normal limits. However, if this patient has true post-menopausal bleeding, endometrial  sampling is indicated to exclude carcinoma. If results are benign, sonohysterogram should be considered for focal lesion work-up. (Ref: Radiological Reasoning: Algorithmic Workup of Abnormal Vaginal Bleeding with Endovaginal Sonography and Sonohysterography. AJR [id number]) Electronically Signed   By: Julian Hy M.D.   On: 05/08/2016 18:00   US Pelvis Limited  05/08/2016  CLINICAL DATA:  Hematuria versus vaginal bleeding, evaluate bladder EXAM: LIMITED ULTRASOUND OF PELVIS TECHNIQUE: Limited transabdominal ultrasound examination of the pelvis was performed. COMPARISON:  None. FINDINGS: Bladder is within normal limits. Bilateral bladder jets are visualized. IMPRESSION: Bladder is within normal limits. Electronically Signed   By: Julian Hy M.D.   On: 05/08/2016 18:00     ASSESSMENT AND PLAN:   Active Problems:   GIB (gastrointestinal bleeding)   Atrial fibrillation (HCC)   #1 GI bleed possibly episode of diverticular bleed/PUD:     bleeding scan also is negative. Seen by gastroenterology.  Appreciate  GI follow-up f #2 malignant essential hypertension. Started  the patient on small dose hydralazine 25 mg by mouth 3 times a day.   Blood pressure is better   3 hyperlipidemia continue statins. #4 history of anxiety continue Paxil. 5.hematuria due to UTI;started  On Rocephin,check urine cultures.  #6. Possible post menopausal bleeding? External exam of vagina showed slight friable mucosa,GYn consulted for further input. Explained to son. Pt needs inpt stay for further work up of bleeding issues,  Spoke with Dr.Anika Cherry,for GYn consult,she said she wil lsee the patient. Son requested transfer to [facility] ,but no indication for inpatient transfer   At this time.spoke to pts Rn and explained that I spoke  with GYN and she will see the patient. All the records are reviewed and case discussed with Care Management/Social Workerr. Management plans discussed with the patient, family and they are in agreement.  CODE STATUS: full  TOTAL TIME TAKING CARE OF THIS PATIENT: 35  minutes.   POSSIBLE D/C IN 1-2DAYS, DEPENDING ON CLINICAL CONDITION.   Epifanio Lesches M.D on 05/09/2016 at 9:46 AM  Between 7am to 6pm - Pager - [phone]  After 6pm go to [email or web address] - password [email or web address] [place] [facility]  Office  [phone]  CC: Primary care physician; Petra Kuba, MD

## 2016-05-09 NOTE — Progress Notes (Signed)
Called transfer center at [facility] said they don't have any beds.will wait for gyn in put and ask family where they want me to try .

## 2016-05-09 NOTE — Care Management (Addendum)
Presents from home with GI bleed. Continues to have bright red clots. GYN consulted. Met with aptient at bedside. She lives at home with her cousin who lives with her. He has health issus so he is not a lot of support. PCP is Dr. Shade Flood at [facility]. She gets her medications at [facility] as well. She has friends that transport her to appointments as needed. She uses a cane and a walker. Has a shower bench. No O2. No home health. PT pending. Agreeable to home health with no agency preference.

## 2016-05-09 NOTE — Evaluation (Signed)
Physical Therapy Evaluation Patient Details Name: Terry Hendrix MRN: [id number] DOB: [birth year] Today's Date: 05/09/2016   History of Present Illness  Terry Hendrix is a 82 y.o. female with a known history of Embolization for GI bleed and hypertension who is currently off of medications due to fluctuations in blood pressure who presents with above complaint. Patient woke up this morning with bright red blood per rectum. Since then she had another episode while in the emergency room. Her blood pressure systolic is [id number]. She also has orthostatic vital signs and reports feeling dizzy and lightheaded. She denies shortness of breath or chest pain. She denies abdominal pain or cramping. Denies falls in the last 12 months  Clinical Impression  Pt is steady and stable with ambulation with use of rolling walker during evaluation. She is able to walk without assistive device but with short shuffling steps. Pt presents with deficits in single leg balance and other higher level balance tasks. She could benefit from OP PT for balance training and fall risk reduction if she is willing to go. Pt and son aware she should obtain order from PCP for PT if desired. Pt will benefit from skilled PT services to address deficits in strength, balance, and mobility in order to return to full function at home.     Follow Up Recommendations Outpatient PT;Other (comment) (For balance, per patient preference and PCP referral)    Equipment Recommendations  None recommended by PT    Recommendations for Other Services       Precautions / Restrictions Precautions Precautions: Fall Restrictions Weight Bearing Restrictions: No      Mobility  Bed Mobility Overal bed mobility: Independent             General bed mobility comments: Good speed and sequencing noted  Transfers Overall transfer level: Needs assistance Equipment used: Rolling walker (2 wheeled) Transfers: Sit to/from Stand Sit to Stand: Supervision          General transfer comment: Pt demonstrates good speed and sequencing with sit to stand transfers. She is stable in standing with bilateral UE support  Ambulation/Gait Ambulation/Gait assistance: Min guard Ambulation Distance (Feet): 220 Feet Assistive device: Rolling walker (2 wheeled) Gait Pattern/deviations: Shuffle Gait velocity: WFL for limited community mobility   General Gait Details: Pt ambulates with shuffling gait but speed is functional for limited community mobility. She utilizes rolling walker but is able to ambulate approximately 66' without assistive device. No overt LOB. VSS throughout ambulation  Stairs            Wheelchair Mobility    Modified Rankin (Stroke Patients Only)       Balance                                             Pertinent Vitals/Pain Pain Assessment: No/denies pain    Home Living Family/patient expects to be discharged to:: Private residence Living Arrangements: Other relatives (Cousin) Available Help at Discharge: Family Type of Home: House Home Access: Stairs to enter Entrance Stairs-Rails: Can reach both Entrance Stairs-Number of Steps: 2 Home Layout: Able to live on main level with bedroom/bathroom Home Equipment: Walker - 2 wheels;Cane - single point;Bedside commode;Shower seat      Prior Function Level of Independence: Needs assistance   Gait / Transfers Assistance Needed: Ambulates limited community distances with single point cane. Intermittent use at home but  mostly holds to walls/furniture as needed  ADL's / Homemaking Assistance Needed: Independent with ADLs, assist with IADLs        Hand Dominance   Dominant Hand: Right    Extremity/Trunk Assessment   Upper Extremity Assessment: Overall WFL for tasks assessed           Lower Extremity Assessment: Overall WFL for tasks assessed         Communication   Communication: No difficulties  Cognition Arousal/Alertness:  Awake/alert Behavior During Therapy: WFL for tasks assessed/performed Overall Cognitive Status: Within Functional Limits for tasks assessed (Slow to answer but able to respond to all AO questions)                      General Comments      Exercises        Assessment/Plan    PT Assessment Patient needs continued PT services  PT Diagnosis Abnormality of gait   PT Problem List Decreased balance;Decreased strength  PT Treatment Interventions DME instruction;Gait training;Stair training;Therapeutic activities;Therapeutic exercise;Balance training;Neuromuscular re-education;Cognitive remediation   PT Goals (Current goals can be found in the Care Plan section) Acute Rehab PT Goals Patient Stated Goal: Return to prior function at home PT Goal Formulation: With patient Time For Goal Achievement: 05/23/16 Potential to Achieve Goals: Fair    Frequency Min 2X/week   Barriers to discharge        Co-evaluation               End of Session Equipment Utilized During Treatment: Gait belt Activity Tolerance: Patient tolerated treatment well Patient left: in bed;with call bell/phone within reach;with bed alarm set;with family/visitor present Nurse Communication: Mobility status         Time: [id number] PT Time Calculation (min) (ACUTE ONLY): 17 min   Charges:   PT Evaluation $PT Eval Low Complexity: 1 Procedure     PT G Codes:       Terry Hendrix PT, DPT   Terry Hendrix 05/09/2016, 11:05 AM

## 2016-05-10 LAB — BASIC METABOLIC PANEL
Anion gap: 6 (ref 5–15)
BUN: 15 mg/dL (ref 6–20)
CHLORIDE: 107 mmol/L (ref 101–111)
CO2: 25 mmol/L (ref 22–32)
Calcium: 9.1 mg/dL (ref 8.9–10.3)
Creatinine, Ser: 1.06 mg/dL — ABNORMAL HIGH (ref 0.44–1.00)
GFR calc Af Amer: 53 mL/min — ABNORMAL LOW (ref >60)
GFR, EST NON AFRICAN AMERICAN: 46 mL/min — AB (ref >60)
Glucose, Bld: 98 mg/dL (ref 65–99)
Potassium: 3.9 mmol/L (ref 3.5–5.1)
Sodium: 138 mmol/L (ref 135–145)

## 2016-05-10 MED ORDER — MEDROXYPROGESTERONE ACETATE 10 MG PO TABS: 10.0000 mg | ORAL_TABLET | Freq: Every day | ORAL | Status: DC

## 2016-05-10 NOTE — Progress Notes (Signed)
Hydralazine given X1 for elevated BP per prn orders this am, BP trending down; slept overnight; Telemetry SR; Gyn MD visited during evening, pelvic exam completed; Barbaraann Faster, RN 6:21 AM 05/10/2016

## 2016-05-10 NOTE — Discharge Summary (Signed)
Terry Hendrix, is a 82 y.o. female  DOB Jun 18, 1927  MRN [id number].  Admission date:  05/04/2016  Admitting Physician  Bettey Costa, MD  Discharge Date:  05/10/2016   Primary MD  Petra Kuba, MD  Recommendations for primary care physician for things to follow:    Follow up with Dr.Anika Cherry in One week. Follow-up with primary doctor in 1 week  Admission Diagnosis  GIB (gastrointestinal bleeding) [K92.2] Gastrointestinal hemorrhage, unspecified gastritis, unspecified gastrointestinal hemorrhage type [K92.2]   Discharge Diagnosis  GIB (gastrointestinal bleeding) [K92.2] Gastrointestinal hemorrhage, unspecified gastritis, unspecified gastrointestinal hemorrhage type [K92.2]    Active Problems:   GIB (gastrointestinal bleeding)   Atrial fibrillation [facility])      Past Medical History  Diagnosis Date  . MDD (major depressive disorder) ([place])   . Hypertension   . Glaucoma   . HLD (hyperlipidemia)     Past Surgical History  Procedure Laterality Date  . Colonic embolization  2014       History of present illness and  Hospital Course:     Kindly see H&P for history of present illness and admission details, please review complete Labs, Consult reports and Test reports for all details in brief  HPI  from the history and physical done on the day of admission 82 year old female patient admitted on May 26 for bright red blood blood from rectum. Patient also had ightheadedness, dizziness. Admitted to hospitalist service for GI bleed.  Hospital Course   Bright red blood from rectum: Patient seen by gastroenterology. Because of history of extensive diverticulosis, monitored closely with CBC every 6 hours, IV hydration. Hematuria C GI workup bleeding resume her to be diverticular. Patient did not have any endoscopy  this hospitalization as patient hemoglobin stayed stable and did not have any further rectal bleed. He myoglobin yesterday 12.2, hematocrit 36.9. Patient is tolerating the regular diet. Patient had a bleeding scan for rectal bleeding is negative.. #2 vaginal spotting. Ultrasound of pelvis showed 6 mm endometrium. Seen by Florene Route. She suggested patient can be having endometrial atrophy and vaginal atrophy and suggested Provera 10 mg for 7-10 days to help with bleeding. And patient can have follow-up with her as an outpatient for possible endometrial biopsy. She and son agreed for this plan. And I also spoke with him over the phone this morning. Patient can be discharged. And follow-up with GYN in 7 days. #3 malignant hypertension:  Hydrallazine 25 mg 3times a day has been added to her medication. Patient advised to continue  Norvasc 10 mg by mouth D daily #4/suspected UTI: Start on Rocephin. Urine culture showed no pathogenic bacteria, multiple species suggesting urogenital flora. I called microbiology they said patient has normal urogenital flora rather than acute the pathogenic urine infection.  Pt  no fever, normal WBC. No indication for home antibiotics.  #5 discussed the discharge plan with patient's son is Tyrone. At 33 6-2 1 2-3 914. He is agreeable for discharge the patient home.   Discharge Condition: stable   Follow UP  Follow-up Information    Follow up with Petra Kuba, MD.   Specialty:  Family Medicine   Why:  Tuesday, June 13th at 8am, [employer] information:   [place] [id number]       Follow up with Rubie Maid, MD In 1 week.   Specialties:  Obstetrics and Gynecology, Radiology   Contact information:   [address] [place] [address]  [phone]         Discharge Instructions  and  Discharge Medications        Medication List    TAKE these medications        amLODipine 10 MG tablet  Commonly  known as:  NORVASC  Take 1 tablet (10 mg total) by mouth daily.     atorvastatin 40 MG tablet  Commonly known as:  LIPITOR  Take 40 mg by mouth at bedtime.     hydrALAZINE 25 MG tablet  Commonly known as:  APRESOLINE  Take 1 tablet (25 mg total) by mouth every 8 (eight) hours.     medroxyPROGESTERone 10 MG tablet  Commonly known as:  PROVERA  Take 1 tablet (10 mg total) by mouth daily.     pantoprazole 40 MG tablet  Commonly known as:  PROTONIX  Take 40 mg by mouth daily.     PARoxetine 10 MG tablet  Commonly known as:  PAXIL  Take 10 mg by mouth daily.     travoprost (benzalkonium) 0.004 % ophthalmic solution  Commonly known as:  TRAVATAN  Place 1 drop into both eyes at bedtime.          Diet and Activity recommendation: See Discharge Instructions above   Consults obtained -  Gyn,GI,   Major procedures and Radiology Reports - PLEASE review detailed and final reports for all details, in brief -      Nm Gi Blood Loss  05/04/2016  CLINICAL DATA:  Gastrointestinal bleeding since this morning with a bowel movement. The blood was bright red and dark red initially. Bright red blood after a second bowel movement today. Hypertensive. Previous gastrointestinal bleed. EXAM: NUCLEAR MEDICINE GASTROINTESTINAL BLEEDING SCAN TECHNIQUE: Sequential abdominal images were obtained following intravenous administration of Tc-58m labeled red blood cells. RADIOPHARMACEUTICALS:  17.08 mCi Tc-54m in-vitro labeled red cells. COMPARISON:  01/27/2013. FINDINGS: Normal intravascular and bladder activity. No intestinal activity seen. IMPRESSION: No active gastrointestinal bleeding seen. Electronically Signed   By: Claudie Revering M.D.   On: 05/04/2016 16:25   US Pelvis Complete  05/08/2016  CLINICAL DATA:  Hematuria versus vaginal bleeding EXAM: TRANSABDOMINAL ULTRASOUND OF PELVIS TECHNIQUE: Transabdominal ultrasound examination of the pelvis was performed including evaluation of the uterus, ovaries,  adnexal regions, and pelvic cul-de-sac. COMPARISON:  None. FINDINGS: Uterus Measurements: 7.7 x 3.8 x 4.7 cm. No fibroids or other mass visualized. Endometrium Thickness: 6 mm.  No focal abnormality visualized. Right ovary Not discretely visualized. Left ovary Not discretely visualized. Other findings:  No abnormal free fluid. IMPRESSION: Endometrial complex measures 6 mm. If this patient does not have vaginal bleeding, the endometrial complex would be considered within normal limits. However, if this patient has true post-menopausal bleeding, endometrial sampling is indicated to exclude carcinoma. If results are benign, sonohysterogram should be considered for focal lesion work-up. (Ref: Radiological Reasoning: Algorithmic Workup of Abnormal Vaginal Bleeding with Endovaginal Sonography and Sonohysterography. AJR [id number]) Electronically Signed   By: Julian Hy M.D.   On: 05/08/2016 18:00   US Pelvis Limited  05/08/2016  CLINICAL DATA:  Hematuria versus vaginal bleeding, evaluate bladder EXAM: LIMITED ULTRASOUND OF PELVIS TECHNIQUE: Limited transabdominal ultrasound examination of the pelvis was performed. COMPARISON:  None. FINDINGS: Bladder is within normal limits. Bilateral bladder jets are visualized. IMPRESSION: Bladder is within normal limits. Electronically Signed   By: Julian Hy M.D.   On: 05/08/2016 18:00    Micro Results     Recent Results (from the past 240 hour(s))  Urine culture     Status: Abnormal   Collection Time: 05/08/16  1:20 PM  Result Value Ref Range Status   Specimen Description URINE, RANDOM  Final   Special Requests NONE  Final   Culture MULTIPLE SPECIES PRESENT, SUGGEST RECOLLECTION (A)  Final   Report Status 05/09/2016 FINAL  Final       Today   Subjective:   Elisha Ponder today has no headache,no chest abdominal pain,no new weakness tingling or numbness, feels much better wants to go home today.  Objective:   Blood pressure 109/56,  pulse 80, temperature 97.9 F (36.6 C), temperature source Oral, resp. rate 14, height 5\' 4"  (1.626 m), weight 71.033 kg (156 lb 9.6 oz), SpO2 98 %.   Intake/Output Summary (Last 24 hours) at 05/10/16 0942 Last data filed at 05/10/16 0840  Gross per 24 hour  Intake    363 ml  Output   1270 ml  Net   -907 ml    Exam Awake Alert, Oriented x 3, No new F.N deficits, Normal affect [place].AT,PERRAL Supple Neck,No JVD, No cervical lymphadenopathy appriciated.  Symmetrical Chest wall movement, Good air movement bilaterally, CTAB RRR,No Gallops,Rubs or new Murmurs, No Parasternal Heave +ve B.Sounds, Abd Soft, Non tender, No organomegaly appriciated, No rebound -guarding or rigidity. No Cyanosis, Clubbing or edema, No new Rash or bruise  Data Review   CBC w Diff: Lab Results  Component Value Date   WBC 6.2 05/09/2016   WBC 6.4 01/29/2013   HGB 12.2 05/09/2016   HGB 9.0* 01/30/2013   HCT 36.9 05/09/2016   HCT 27.0* 01/29/2013   PLT 187 05/09/2016   PLT 197 01/29/2013   LYMPHOPCT 13 05/09/2016   LYMPHOPCT 16.4 01/29/2013   MONOPCT 10 05/09/2016   MONOPCT 12.4 01/29/2013   EOSPCT 2 05/09/2016   EOSPCT 3.9 01/29/2013   BASOPCT 0 05/09/2016   BASOPCT 0.4 01/29/2013    CMP: Lab Results  Component Value Date   NA 138 05/10/2016   NA 144 01/29/2013   K 3.9 05/10/2016   K 3.2* 01/29/2013   CL 107 05/10/2016   CL 112* 01/29/2013   CO2 25 05/10/2016   CO2 24 01/29/2013   BUN 15 05/10/2016   BUN 5* 01/29/2013   CREATININE 1.06* 05/10/2016   CREATININE 0.84 01/29/2013   PROT 7.3 05/04/2016   PROT 7.6 01/25/2013   ALBUMIN 3.8 05/04/2016   ALBUMIN 3.3* 01/25/2013   BILITOT 0.6 05/04/2016   BILITOT 0.6 01/25/2013   ALKPHOS 39 05/04/2016   ALKPHOS 52 01/25/2013   AST 19 05/04/2016   AST 18 01/25/2013   ALT 14 05/04/2016   ALT 13 01/25/2013  .   Total Time in preparing paper work, data evaluation and todays exam - 69 minutes  [place] Souter M.D on 05/10/2016 at 9:42  AM    Note: This dictation was prepared with Dragon dictation along with smaller phrase technology. Any transcriptional errors that result from this process are unintentional.

## 2016-05-10 NOTE — Progress Notes (Signed)
Dr. Jannifer Franklin notified of CBC every 8 hours since May 26; H/H stable; GI bleed ruled out; GYN consult completed; possible discharge in am; acknowledged; new order written. Barbaraann Faster, RN 1:01 AM 05/10/2016

## 2016-05-10 NOTE — Progress Notes (Signed)
Discharge instructions reviewed with pt and family, medications e-submitted. IV and tele removed and pt to be escorted off unit via wheelchair by nursing staff.

## 2016-05-22 ENCOUNTER — Encounter: Payer: Self-pay | Admitting: Emergency Medicine

## 2016-05-22 ENCOUNTER — Emergency Department
Admission: EM | Admit: 2016-05-22 | Discharge: 2016-05-22 | Disposition: A | Discharge status: Home / Self Care | Payer: Medicare Other | Attending: Student | Admitting: Student

## 2016-05-22 ENCOUNTER — Telehealth: Payer: Self-pay | Admitting: Obstetrics and Gynecology

## 2016-05-22 DIAGNOSIS — N939 Abnormal uterine and vaginal bleeding, unspecified: Secondary | ICD-10-CM | POA: Diagnosis not present

## 2016-05-22 DIAGNOSIS — I4891 Unspecified atrial fibrillation: Secondary | ICD-10-CM | POA: Diagnosis not present

## 2016-05-22 DIAGNOSIS — Z79899 Other long term (current) drug therapy: Secondary | ICD-10-CM | POA: Insufficient documentation

## 2016-05-22 DIAGNOSIS — E785 Hyperlipidemia, unspecified: Secondary | ICD-10-CM | POA: Diagnosis not present

## 2016-05-22 DIAGNOSIS — F329 Major depressive disorder, single episode, unspecified: Secondary | ICD-10-CM | POA: Diagnosis not present

## 2016-05-22 DIAGNOSIS — I10 Essential (primary) hypertension: Secondary | ICD-10-CM | POA: Diagnosis not present

## 2016-05-22 LAB — CBC
HEMATOCRIT: 36.2 % (ref 35.0–47.0)
Hemoglobin: 11.8 g/dL — ABNORMAL LOW (ref 12.0–16.0)
MCH: 29.2 pg (ref 26.0–34.0)
MCHC: 32.7 g/dL (ref 32.0–36.0)
MCV: 89.4 fL (ref 80.0–100.0)
Platelets: 226 10*3/uL (ref 150–440)
RBC: 4.05 MIL/uL (ref 3.80–5.20)
RDW: 14.7 % — AB (ref 11.5–14.5)
WBC: 6.9 10*3/uL (ref 3.6–11.0)

## 2016-05-22 LAB — TYPE AND SCREEN
ABO/RH(D): O NEG
Antibody Screen: NEGATIVE

## 2016-05-22 LAB — COMPREHENSIVE METABOLIC PANEL
ALBUMIN: 3.7 g/dL (ref 3.5–5.0)
ALT: 15 U/L (ref 14–54)
AST: 21 U/L (ref 15–41)
Alkaline Phosphatase: 41 U/L (ref 38–126)
Anion gap: 10 (ref 5–15)
BUN: 19 mg/dL (ref 6–20)
CHLORIDE: 105 mmol/L (ref 101–111)
CO2: 23 mmol/L (ref 22–32)
Calcium: 9.4 mg/dL (ref 8.9–10.3)
Creatinine, Ser: 1.41 mg/dL — ABNORMAL HIGH (ref 0.44–1.00)
GFR calc Af Amer: 38 mL/min — ABNORMAL LOW (ref >60)
GFR, EST NON AFRICAN AMERICAN: 32 mL/min — AB (ref >60)
Glucose, Bld: 95 mg/dL (ref 65–99)
POTASSIUM: 4.1 mmol/L (ref 3.5–5.1)
SODIUM: 138 mmol/L (ref 135–145)
Total Bilirubin: 0.5 mg/dL (ref 0.3–1.2)
Total Protein: 7.6 g/dL (ref 6.5–8.1)

## 2016-05-22 LAB — APTT: APTT: 28 s (ref 24–36)

## 2016-05-22 LAB — PROTIME-INR
INR: 1.05
PROTHROMBIN TIME: 13.9 s (ref 11.4–15.0)

## 2016-05-22 MED ORDER — SODIUM CHLORIDE 0.9 % IV BOLUS (SEPSIS)
500.0000 mL | Freq: Once | INTRAVENOUS | Status: AC
  Administered 2016-05-22: 500 mL via INTRAVENOUS

## 2016-05-22 NOTE — Telephone Encounter (Signed)
Pt called wondering the results of her labs that were done while she was seen in the ER by Dr. Marcelline Mates. Pt stated that she was bleeding too heavily to wait until her new patient appt on 06/07/16 with Dr. Marcelline Mates. Spoke with [place], [place] and she advised that because she is not a patient of Dr. Marcelline Mates, that we could not offer any medical advice. An appt on 05/31/16 was offered to the patient and the recommendation of returning to the ER was made if the patient did not want to wait until her appt with [facility]. Pt declined the sooner appt and stated that she would go to the ER and call us back.

## 2016-05-22 NOTE — ED Provider Notes (Addendum)
[facility] Emergency Department Provider Note   ____________________________________________  Time seen: Approximately 4:03 PM  I have reviewed the triage vital signs and the nursing notes.   HISTORY  Chief Complaint Rectal Bleeding    HPI Terry Hendrix is a 82 y.o. female with history of depression, hypertension, hyperlipidemia and glaucoma with recent hospitalization due to concern initially for diverticular bleed though she had a normal bleeding scan and stable hemoglobin and was found later in the hospitalization to have have abnormal vaginal bleeding with an endometrial stripe of 6 mm on ultrasound who presents for evaluation of continued vaginal bleeding since her discharge on 05/09/2016, gradual, intermittent, moderate, no modifying factors. Reports that she is filling up 2-3 small pads per day. No abdominal pain, no vomiting, diarrhea, fevers or chills that she does not think she is having any blood per rectum. she is not chronically anticoagulated. She was evaluated by Dr. Marcelline Mates of OB/GYN during this most recent hot location and the plan was to have her follow-up in the clinic for endometrial biopsy.   Past Medical History  Diagnosis Date  . MDD (major depressive disorder) ([place])   . Hypertension   . Glaucoma   . HLD (hyperlipidemia)     Patient Active Problem List   Diagnosis Date Noted  . GIB (gastrointestinal bleeding) 05/04/2016  . Atrial fibrillation ([place]) 05/04/2016    Past Surgical History  Procedure Laterality Date  . Colonic embolization  2014    Current Outpatient Rx  Name  Route  Sig  Dispense  Refill  . amLODipine (NORVASC) 10 MG tablet   Oral   Take 1 tablet (10 mg total) by mouth daily.   30 tablet   0   . atorvastatin (LIPITOR) 40 MG tablet   Oral   Take 40 mg by mouth at bedtime.         . hydrALAZINE (APRESOLINE) 25 MG tablet   Oral   Take 1 tablet (25 mg total) by mouth every 8 (eight) hours.   60 tablet  0   . medroxyPROGESTERone (PROVERA) 10 MG tablet   Oral   Take 1 tablet (10 mg total) by mouth daily.   10 tablet   0   . pantoprazole (PROTONIX) 40 MG tablet   Oral   Take 40 mg by mouth daily.         Marland Kitchen PARoxetine (PAXIL) 10 MG tablet   Oral   Take 10 mg by mouth daily.         . travoprost, benzalkonium, (TRAVATAN) 0.004 % ophthalmic solution   Both Eyes   Place 1 drop into both eyes at bedtime.           Allergies Review of patient's allergies indicates no known allergies.  No family history on file.  Social History Social History  Substance Use Topics  . Smoking status: Never Smoker   . Smokeless tobacco: None  . Alcohol Use: No    Review of Systems Constitutional: No fever/chills Eyes: No visual changes. ENT: No sore throat. Cardiovascular: Denies chest pain. Respiratory: Denies shortness of breath. Gastrointestinal: No abdominal pain.  No nausea, no vomiting.  No diarrhea.  No constipation. Genitourinary: Negative for dysuria. Musculoskeletal: Negative for back pain. Skin: Negative for rash. Neurological: Negative for headaches, focal weakness or numbness.  10-point ROS otherwise negative.  ____________________________________________   PHYSICAL EXAM:  VITAL SIGNS: ED Triage Vitals  Enc Vitals Group     BP 05/22/16 1551 141/97 mmHg  Pulse Rate 05/22/16 1551 79     Resp 05/22/16 1551 16     Temp 05/22/16 1551 98.2 F (36.8 C)     Temp src --      SpO2 05/22/16 1551 100 %     Weight 05/22/16 1551 153 lb (69.4 kg)     Height 05/22/16 1551 5\' 4"  (1.626 m)     Head Cir --      Peak Flow --      Pain Score 05/22/16 1552 0     Pain Loc --      Pain Edu? --      Excl. in [place]? --     Constitutional: Alert and oriented. Well appearing and in no acute distress. Eyes: Conjunctivae are normal. PERRL. EOMI. Head: Atraumatic. Nose: No congestion/rhinnorhea. Mouth/Throat: Mucous membranes are moist.  Oropharynx non-erythematous. Neck: No  stridor.  Supple without meningismus. Cardiovascular: Normal rate, regular rhythm. Grossly normal heart sounds.  Good peripheral circulation. Respiratory: Normal respiratory effort.  No retractions. Lungs CTAB. Gastrointestinal: Soft and nontender. No distention.  No CVA tenderness. Pelvic: small amount of dark blood in the vaginal vault, irregular-appearing cervix Musculoskeletal: No lower extremity tenderness nor edema.  No joint effusions. Neurologic:  Normal speech and language. No gross focal neurologic deficits are appreciated. No gait instability. Skin:  Skin is warm, dry and intact. No rash noted. Psychiatric: Mood and affect are normal. Speech and behavior are normal.  ____________________________________________   LABS (all labs ordered are listed, but only abnormal results are displayed)  Labs Reviewed  COMPREHENSIVE METABOLIC PANEL - Abnormal; Notable for the following:    Creatinine, Ser 1.41 (*)    GFR calc non Af Amer 32 (*)    GFR calc Af Amer 38 (*)    All other components within normal limits  CBC - Abnormal; Notable for the following:    Hemoglobin 11.8 (*)    RDW 14.7 (*)    All other components within normal limits  PROTIME-INR  APTT  POC OCCULT BLOOD, ED  TYPE AND SCREEN   ____________________________________________  EKG  none ____________________________________________  RADIOLOGY  none ____________________________________________   PROCEDURES  Procedure(s) performed: None  Critical Care performed: No  ____________________________________________   INITIAL IMPRESSION / ASSESSMENT AND PLAN / ED COURSE  Pertinent labs & imaging results that were available during my care of the patient were reviewed by me and considered in my medical decision making (see chart for details).  Terry Hendrix is a 82 y.o. female with history of depression, hypertension, hyperlipidemia and glaucoma with recent hospitalization due to concern initially for  diverticular bleed though she had a normal bleeding scan and stable hemoglobin and was found later in the hospitalization to have have abnormal vaginal bleeding with an endometrial stripe of 6 mm on ultrasound who presents for evaluation of continued vaginal bleeding since her discharge on 05/09/2016. She was treated with Provera. She has no other complaints. On exam, she is well-appearing and in no acute distress, her vital signs are stable, she is afebrile. Pelvic exam shows very small amount of dark blood in the vaginal vault as well as an irregular appearing cervix. Bleeding seems to be isolated to the vagina. CBC is notable for mild anemia, hemoglobin 11.8 however similar to what her hemoglobin was just 13 days ago which was 12.2. Creatinine today is mildly elevated at 1.41, 12 days ago it was 1.06, will give IV fluids and encourage PO hydration. I discussed the case with Dr. Marcelline Mates  of OB/GYN, who recommended discharge with close follow-up in her office. The patient will call the office in the morning. No additional imaging needed tonight. I discussed this with the patient as well as meticulous return precautions and she is comfortable with the discharge plan. DC home. ____________________________________________   FINAL CLINICAL IMPRESSION(S) / ED DIAGNOSES  Final diagnoses:  Vaginal bleeding      NEW MEDICATIONS STARTED DURING THIS VISIT:  New Prescriptions   No medications on file     Note:  This document was prepared using Dragon voice recognition software and may include unintentional dictation errors.    Joanne Gavel, MD 05/22/16 [place] Ayushi Pla, MD 05/22/16 [phone]

## 2016-05-22 NOTE — ED Notes (Signed)
Patient hosptialized on May 26 for Diverticulitis and rectal bleeding. Patient presents today stating that rectal bleeding has never stopped.  Was seen by PCP this morning and has a follow up appointment with Dr. Marcelline Mates scheduled for 6/29.  Patient did not feel she could wait until the 29th, so presents to ED for eval.

## 2016-05-22 NOTE — ED Notes (Signed)
Pt in via triage with complaints of bleeding; pt seems unsure as to where bleeding is coming from as she explained to triage RN that the bleeding has never quit from her previous admission.  Pt tells me that she is having vaginal bleeding, filling up 2-3 pads per day.  Pt denies any abdominal pain, nausea/vomiting.  Pt A/Ox4, hypertensive upon arrival, other vitals WDL, no immediate distress noted.

## 2016-05-22 NOTE — ED Notes (Signed)
Pelvic cart set up at pt bedside.

## 2016-05-24 ENCOUNTER — Ambulatory Visit (INDEPENDENT_AMBULATORY_CARE_PROVIDER_SITE_OTHER): Payer: Medicare Other | Admitting: Obstetrics and Gynecology

## 2016-05-24 ENCOUNTER — Encounter: Payer: Self-pay | Admitting: Obstetrics and Gynecology

## 2016-05-24 VITALS — BP 107/70 | HR 78 | Wt 154.7 lb

## 2016-05-24 DIAGNOSIS — N950 Postmenopausal bleeding: Secondary | ICD-10-CM

## 2016-05-26 ENCOUNTER — Encounter: Payer: Self-pay | Admitting: Obstetrics and Gynecology

## 2016-05-26 NOTE — Progress Notes (Signed)
GYNECOLOGY CLINIC PROGRESS NOTE  Subjective:    Terry Hendrix is a 82 y.o. P52 post-menopausal female who presents for concerns regarding vaginal bleeding. Is following up from recent hospitalization. She has been menopausal for for almost 40 years. Is not currently on HRT and has never used. Bleeding is described as less flow than a normal period, using approximately 2-3 pads per day.  This has been ongoing for ~ 3 weeks.  This is the first time she has experience PMB. Workup to date: CBC, pelvic ultrasound performed during patient's recent hospitalization 3 weeks ago.  Patient was also seen in the ER yesterday for continued bleeding but was able to be discharged home.   Of note, patient also has a h/o GI bleeding in the past.   Menstrual History: Menarche age: ~86 (however patient cannot recall).  No LMP recorded. Patient is postmenopausal.     Past Medical History  Diagnosis Date  . MDD (major depressive disorder) ([place])   . Hypertension   . Glaucoma   . HLD (hyperlipidemia)     History reviewed. No pertinent family history.  Past Surgical History  Procedure Laterality Date  . Colonic embolization  2014    Social History   Social History  . Marital Status: Widowed    Spouse Name: N/A  . Number of Children: N/A  . Years of Education: N/A   Occupational History  . Not on file.   Social History Main Topics  . Smoking status: Never Smoker   . Smokeless tobacco: Not on file  . Alcohol Use: No  . Drug Use: Not on file  . Sexual Activity: Not Currently   Other Topics Concern  . Not on file   Social History Narrative    Current Outpatient Prescriptions on File Prior to Visit  Medication Sig Dispense Refill  . amLODipine (NORVASC) 10 MG tablet Take 1 tablet (10 mg total) by mouth daily. 30 tablet 0  . atorvastatin (LIPITOR) 40 MG tablet Take 40 mg by mouth at bedtime.    . hydrALAZINE (APRESOLINE) 25 MG tablet Take 1 tablet (25 mg total) by mouth every 8  (eight) hours. 60 tablet 0  . medroxyPROGESTERone (PROVERA) 10 MG tablet Take 1 tablet (10 mg total) by mouth daily. 10 tablet 0  . pantoprazole (PROTONIX) 40 MG tablet Take 40 mg by mouth daily.    Marland Kitchen PARoxetine (PAXIL) 10 MG tablet Take 10 mg by mouth daily.    . travoprost, benzalkonium, (TRAVATAN) 0.004 % ophthalmic solution Place 1 drop into both eyes at bedtime.     No current facility-administered medications on file prior to visit.    No Known Allergies   Review of Systems Constitutional: Negative for fever, chills and weight loss.  HENT: Negative for congestion and tinnitus.  Eyes: Negative for blurred vision and pain.  Respiratory: Negative for cough and shortness of breath.  Cardiovascular: Negative for chest pain and palpitations.  Gastrointestinal: Negative for nausea, vomiting and abdominal pain, bloody stools.  Genitourinary:  Positive for vaginal bleeding. Negative for hematuria, dysuria, urgency, frequency and flank pain.  Musculoskeletal: Negative for back pain, joint pain and neck pain.  Skin: Negative for itching and rash.  Neurological: Negative for dizziness, seizures, loss of consciousness and headaches.  Endo/Heme/Allergies: Does not bruise/bleed easily.  Psychiatric/Behavioral: Negative for depression. The patient does not have insomnia  Objective:    BP 107/70 mmHg  Pulse 78  Wt 154 lb 11.2 oz (70.171 kg)  Physical Exam  Constitutional:  She is oriented to person, place, and time. She appears well-developed and well-nourished.  HENT:  Head: Normocephalic and atraumatic.  Right Ear: External ear normal.  Left Ear: External ear normal.  Nose: Nose normal.  Mouth/Throat: Oropharynx is clear and moist.  Eyes: Conjunctivae and EOM are normal. Pupils are equal, round, and reactive to light. No scleral icterus.  Neck: Normal range of motion. Neck supple. No JVD present. No tracheal deviation present. No thyromegaly present.  Cardiovascular: Normal rate,  regular rhythm and normal heart sounds.  Respiratory: Effort normal and breath sounds normal. No respiratory distress.  GI: Soft. Bowel sounds are normal. She exhibits no distension and no mass. There is no tenderness. There is no rebound and no guarding.  Genitourinary: Vagina normal and uterus normal. No vaginal discharge found. Adnexae normal, non-palpable. Small amount of dark red blood in vaginal vault. No vaginal lesions. Atrophy present. Cervix slightly flushed to vaginal wall, no lesions, no CMT. Uterus normal size, shape, consistency, mobile.  Musculoskeletal: Normal range of motion. She exhibits no edema or tenderness.  Lymphadenopathy:   She has no cervical adenopathy.  Neurological: She is alert and oriented to person, place, and time.  Skin: Skin is warm and dry. No rash noted. No erythema.  Psychiatric: She has a normal mood and affect. Her behavior is normal.    Labs:   CBC Latest Ref Rng 05/22/2016 05/09/2016 05/09/2016  WBC 3.6 - 11.0 K/uL 6.9 6.2 7.1  Hemoglobin 12.0 - 16.0 g/dL 11.8(L) 12.2 12.1  Hematocrit 35.0 - 47.0 % 36.2 36.9 36.4  Platelets 150 - 440 K/uL 226 187 197    Lab Results  Component Value Date   TSH 1.365 06/29/2010     Imaging:  US Pelvis Complete  05/08/2016 CLINICAL DATA: Hematuria versus vaginal bleeding EXAM: TRANSABDOMINAL ULTRASOUND OF PELVIS TECHNIQUE: Transabdominal ultrasound examination of the pelvis was performed including evaluation of the uterus, ovaries, adnexal regions, and pelvic cul-de-sac. COMPARISON: None. FINDINGS: Uterus Measurements: 7.7 x 3.8 x 4.7 cm. No fibroids or other mass visualized. Endometrium Thickness: 6 mm. No focal abnormality visualized. Right ovary Not discretely visualized. Left ovary Not discretely visualized. Other findings: No abnormal free fluid. IMPRESSION: Endometrial complex measures 6 mm. If this patient does not have vaginal bleeding, the endometrial complex would be considered within normal  limits. However, if this patient has true post-menopausal bleeding, endometrial sampling is indicated to exclude carcinoma. If results are benign, sonohysterogram should be considered for focal lesion work-up. (Ref: Radiological Reasoning: Algorithmic Workup of Abnormal Vaginal Bleeding with Endovaginal Sonography and Sonohysterography. AJR [id number]) Electronically Signed By: Julian Hy M.D. On: 05/08/2016 18:00   US Pelvis Limited  05/08/2016 CLINICAL DATA: Hematuria versus vaginal bleeding, evaluate bladder EXAM: LIMITED ULTRASOUND OF PELVIS TECHNIQUE: Limited transabdominal ultrasound examination of the pelvis was performed. COMPARISON: None. FINDINGS: Bladder is within normal limits. Bilateral bladder jets are visualized. IMPRESSION: Bladder is within normal limits. Electronically Signed By: Julian Hy M.D. On: 05/08/2016 18:00    Assessment:   Postmenopausal bleeding    Plan:   - Diagnosis explained in detail, including differential.  Discussed etiologies of postmenopausal bleeding, concern about precancerous/hyperplasia or cancerous etiology (5 to 10% percent of cases). Also discussed role of unopposed estrogen exposure in leading to thickened or proliferative endometrium; and its possible correlation with endometrial hyperplasia/carcinoma.   However, she was reassured that endometrial atrophy and endometrial polyps are the most common causes of postmenopausal bleeding.  Uterine bleeding in postmenopausal women is usually light and self-limited.  Exclusion of cancer is the main objective; therefore, treatment is usually unnecessary once cancer has been excluded. - All questions answered. - Endometrial biopsy - see separate procedure note.   - Will notify patient of results by phone, if normal, otherwise will have patient return for f/u appointment to discuss any abnormalities.      Endometrial Biopsy Procedure Note  The patient was positioned on the exam  table in the dorsal lithotomy position. Bimanual exam confirms uterine position and size. A Graves speculum was placed into the vagina. A single toothed tenaculum was placed onto the anterior lip of the cervix. The pipette was placed into the endocervical canal and is advanced to the uterine fundus. Using a piston like technique, with vacuum created by withdrawing the stylus, the endometrial specimen is obtained and transferred to the biopsy container. Minimal bleeding is encountered. The procedure is well tolerated.   Uterine Position: anterior    Uterine Length: 8 cm   Uterine Specimen: Scant   Post procedure instructions are given. The patient is scheduled for follow up appointment.    Rubie Maid, MD [facility]

## 2016-05-29 LAB — PATHOLOGY

## 2016-05-30 ENCOUNTER — Telehealth: Payer: Self-pay | Admitting: *Deleted

## 2016-05-30 NOTE — Telephone Encounter (Signed)
Please advise 

## 2016-05-30 NOTE — Telephone Encounter (Signed)
Patient called and stated she had a biopsy done and was inquiring about the results. Patient is requesting a call back. Her number is [phone]. Thanks

## 2016-05-31 NOTE — Telephone Encounter (Signed)
Called pt informed her of negative results. Pt notes that she has not had any bleeding since last Thursday. Advised pt to call back if bleeding resumes. Pt gave verbal understanding.

## 2016-05-31 NOTE — Telephone Encounter (Signed)
Please inform patient that biopsy results are negative, no evidence of cancer.  She likely has endometrial atrophy (thinning of lining of uterus which is irritated).  Can prescribe a dose of Provera 10 mg x 10 days which should help if she's still bleeding.

## 2016-06-07 ENCOUNTER — Encounter: Payer: Self-pay | Admitting: Obstetrics and Gynecology

## 2016-11-27 ENCOUNTER — Other Ambulatory Visit: Payer: Self-pay | Admitting: Family Medicine

## 2016-12-06 ENCOUNTER — Other Ambulatory Visit: Payer: Self-pay | Admitting: Family Medicine

## 2016-12-06 DIAGNOSIS — N63 Unspecified lump in unspecified breast: Secondary | ICD-10-CM

## 2016-12-10 DIAGNOSIS — C50919 Malignant neoplasm of unspecified site of unspecified female breast: Secondary | ICD-10-CM

## 2016-12-10 HISTORY — PX: BREAST BIOPSY: SHX20

## 2016-12-10 HISTORY — DX: Malignant neoplasm of unspecified site of unspecified female breast: C50.919

## 2016-12-20 ENCOUNTER — Ambulatory Visit
Admission: RE | Admit: 2016-12-20 | Discharge: 2016-12-20 | Disposition: A | Discharge status: Home / Self Care | Payer: Medicare HMO | Source: Ambulatory Visit | Attending: Family Medicine | Admitting: Family Medicine

## 2016-12-20 DIAGNOSIS — N63 Unspecified lump in unspecified breast: Secondary | ICD-10-CM

## 2016-12-20 DIAGNOSIS — N6321 Unspecified lump in the left breast, upper outer quadrant: Secondary | ICD-10-CM | POA: Diagnosis not present

## 2016-12-20 DIAGNOSIS — N6323 Unspecified lump in the left breast, lower outer quadrant: Secondary | ICD-10-CM | POA: Insufficient documentation

## 2017-01-01 ENCOUNTER — Other Ambulatory Visit: Payer: Self-pay | Admitting: Family Medicine

## 2017-01-01 DIAGNOSIS — R928 Other abnormal and inconclusive findings on diagnostic imaging of breast: Secondary | ICD-10-CM

## 2017-01-01 DIAGNOSIS — N6320 Unspecified lump in the left breast, unspecified quadrant: Secondary | ICD-10-CM

## 2017-01-07 ENCOUNTER — Ambulatory Visit
Admission: RE | Admit: 2017-01-07 | Discharge: 2017-01-07 | Disposition: A | Discharge status: Home / Self Care | Payer: Medicare HMO | Source: Ambulatory Visit | Attending: Family Medicine | Admitting: Family Medicine

## 2017-01-07 DIAGNOSIS — N6320 Unspecified lump in the left breast, unspecified quadrant: Secondary | ICD-10-CM

## 2017-01-07 DIAGNOSIS — C50912 Malignant neoplasm of unspecified site of left female breast: Secondary | ICD-10-CM | POA: Insufficient documentation

## 2017-01-07 DIAGNOSIS — R928 Other abnormal and inconclusive findings on diagnostic imaging of breast: Secondary | ICD-10-CM

## 2017-01-11 ENCOUNTER — Inpatient Hospital Stay: Payer: Medicare HMO | Attending: Oncology | Admitting: Oncology

## 2017-01-11 ENCOUNTER — Encounter: Payer: Self-pay | Admitting: Oncology

## 2017-01-11 DIAGNOSIS — Z803 Family history of malignant neoplasm of breast: Secondary | ICD-10-CM | POA: Diagnosis not present

## 2017-01-11 DIAGNOSIS — Z79899 Other long term (current) drug therapy: Secondary | ICD-10-CM | POA: Diagnosis not present

## 2017-01-11 DIAGNOSIS — E785 Hyperlipidemia, unspecified: Secondary | ICD-10-CM | POA: Diagnosis not present

## 2017-01-11 DIAGNOSIS — Z170 Estrogen receptor positive status [ER+]: Secondary | ICD-10-CM

## 2017-01-11 DIAGNOSIS — C50412 Malignant neoplasm of upper-outer quadrant of left female breast: Secondary | ICD-10-CM | POA: Insufficient documentation

## 2017-01-11 DIAGNOSIS — I10 Essential (primary) hypertension: Secondary | ICD-10-CM | POA: Insufficient documentation

## 2017-01-11 DIAGNOSIS — R590 Localized enlarged lymph nodes: Secondary | ICD-10-CM | POA: Insufficient documentation

## 2017-01-11 DIAGNOSIS — F329 Major depressive disorder, single episode, unspecified: Secondary | ICD-10-CM | POA: Diagnosis not present

## 2017-01-11 NOTE — Progress Notes (Signed)
Patient denies pain or discomfort at this time, BP 154/104 and documented.

## 2017-01-11 NOTE — Progress Notes (Signed)
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 Subsequent mammogram and biopsy revealed the above stated breast cancer. Ultrasound noted 2 lesions, but unclear if these were distinct lesions or one larger lesion. Currently, patient feels well and is asymptomatic. She has no neurologic complaints. She denies any recent fevers. She has a good appetite and denies weight loss. She has no chest pain or shortness of breath. She denies any nausea, vomiting, constipation, or diarrhea. She has no urinary complaints. [name].  REVIEW OF SYSTEMS:   Review of Systems  Constitutional: Negative.  Negative for fever, malaise/fatigue and weight loss.  Respiratory: Negative.  Negative for cough and shortness of breath.   Cardiovascular: Negative.  Negative for chest pain and leg swelling.  Gastrointestinal: Negative.  Negative for abdominal pain.  Genitourinary: Negative.   Musculoskeletal: Negative.   Neurological: Negative.  Negative for weakness.  Psychiatric/Behavioral: Negative.  The patient is not nervous/anxious.     As per HPI. Otherwise, a complete review of systems is negative.  PAST MEDICAL HISTORY: Past Medical History:  Diagnosis Date  . Anemia   . Glaucoma   . HLD (hyperlipidemia)   . Hypertension   . MDD (major depressive disorder)     PAST SURGICAL HISTORY: Past Surgical History:  Procedure Laterality Date  . COLONIC EMBOLIZATION  2014    FAMILY HISTORY: Family History  Problem Relation Age of Onset  . Breast cancer Sister      ADVANCED DIRECTIVES (Y/N):  N  HEALTH MAINTENANCE: Social History  Substance Use Topics  . Smoking status: Never Smoker  . Smokeless tobacco: Never Used  . Alcohol use No     Colonoscopy:  PAP:  Bone density:  Lipid panel:  No Known Allergies  Current Outpatient Prescriptions  Medication Sig Dispense Refill  . atorvastatin (LIPITOR) 40 MG tablet Take 40 mg by mouth at bedtime.    . pantoprazole (PROTONIX) 40 MG tablet Take 40 mg by mouth daily.    Marland Kitchen PARoxetine (PAXIL) 10 MG tablet Take 10 mg by mouth daily.    . travoprost, benzalkonium, (TRAVATAN) 0.004 % ophthalmic solution Place 1 drop into both eyes at bedtime.    Marland Kitchen albuterol (ACCUNEB) 0.63 MG/3ML nebulizer solution Inhale into the lungs.    Marland Kitchen amLODipine (NORVASC) 10 MG tablet Take 1 tablet (10 mg total) by mouth daily. (Patient not taking: Reported on 01/11/2017) 30 tablet 0  . hydrALAZINE (APRESOLINE) 25 MG tablet Take 1 tablet (25 mg total) by mouth every 8 (eight) hours. (Patient not taking: Reported on 01/11/2017) 60 tablet 0  . medroxyPROGESTERone (PROVERA) 10 MG tablet Take 1 tablet (10 mg total) by mouth daily. (Patient not taking: Reported on 01/11/2017) 10 tablet 0   No current facility-administered medications for this visit.     OBJECTIVE: Vitals:   01/11/17 1349  BP: (!) 154/104  Pulse: 75  Temp: 97.1 F (36.2 C)     Body mass index is 26.27 kg/m.    ECOG FS:0 - Asymptomatic  General: Well-developed, well-nourished, no acute distress. Eyes: Pink conjunctiva, anicteric sclera. Breasts: Patient had multiple family members  in the room and requested exam be deferred. HEENT: Normocephalic, moist mucous membranes, clear oropharnyx. Lungs: Clear to auscultation bilaterally. Heart: Regular rate and rhythm. No rubs, murmurs, or gallops. Abdomen: Soft, nontender, nondistended. No organomegaly noted, normoactive bowel sounds. Musculoskeletal: No edema, cyanosis, or clubbing. Neuro: Alert, answering all questions  appropriately. Cranial nerves grossly intact. Skin: No rashes or petechiae noted. Psych: Normal affect. Lymphatics: No cervical, calvicular, axillary or inguinal LAD.   LAB RESULTS:  Lab Results  Component Value Date   NA 138 05/22/2016   K 4.1 05/22/2016   CL 105 05/22/2016   CO2 23 05/22/2016   GLUCOSE 95 05/22/2016   BUN 19 05/22/2016   CREATININE 1.41 (H) 05/22/2016   CALCIUM 9.4 05/22/2016   PROT 7.6 05/22/2016   ALBUMIN 3.7 05/22/2016   AST 21 05/22/2016   ALT 15 05/22/2016   ALKPHOS 41 05/22/2016   BILITOT 0.5 05/22/2016   GFRNONAA 32 (L) 05/22/2016   GFRAA 38 (L) 05/22/2016    Lab Results  Component Value Date   WBC 6.9 05/22/2016   NEUTROABS 4.6 05/09/2016   HGB 11.8 (L) 05/22/2016   HCT 36.2 05/22/2016   MCV 89.4 05/22/2016   PLT 226 05/22/2016     STUDIES: US Breast Ltd Uni Left Inc Axilla  Result Date: 12/20/2016 CLINICAL DATA:  Patient presents for palpable mass within the anterior left breast. EXAM: 2D DIGITAL DIAGNOSTIC BILATERAL MAMMOGRAM WITH CAD AND ADJUNCT TOMO ULTRASOUND LEFT BREAST COMPARISON:  Previous exam(s). ACR Breast Density Category b: There are scattered areas of fibroglandular density. FINDINGS: Underlying the palpable marker within the anterior left breast are 2 adjacent lobular masses measuring 1.8 and 1.6 cm. Additionally, within the lateral left breast both superiorly and inferiorly are additional lobular masses. Mammographic images were processed with CAD. On physical exam, I palpate a firm mass within the central anterior left breast. Targeted ultrasound is performed, showing a 2.6 x 1.8 x 1.8 cm lobular hypoechoic mass within the left breast 12 o'clock position 1 cm from the nipple. Within left breast 230 o'clock 3 cm from nipple there is a 2.1 x 1.0 x 1.4 cm lobular predominately solid mass with cystic components. There is suggestion of connection between these 2 masses. Within the left breast 1 o'clock position 6 cm from nipple there is  a lobular 0.9 x 0.4 x 0.7 cm hypoechoic mass. Prominent left axillary lymph nodes are demonstrated. Additionally, there a few scattered cysts within the central anterior left breast. IMPRESSION: Two adjacent suspicious masses concerning for primary breast carcinoma. There is suggestion on ultrasound that these masses may be connected and represent one larger dominant mass. Additionally there are adjacent suspicious masses within the upper outer and lower outer left breast with associated dilated ducts. Prominent left axillary lymph nodes. RECOMMENDATION: Ultrasound-guided core needle biopsy dominant left breast mass for definitive characterization. Depending upon desired clinical treatment, breast MRI may be warranted to determine the possibility of multifocal malignancy. I have discussed the findings and recommendations with the patient. Results were also provided in writing at the conclusion of the visit. If applicable, a reminder letter will be sent to the patient regarding the next appointment. BI-RADS CATEGORY  5: Highly suggestive of malignancy. Electronically Signed   By: Lovey Newcomer M.D.   On: 12/20/2016 16:43   Mm Diag Breast Tomo Bilateral  Result Date: 12/20/2016 CLINICAL DATA:  Patient presents for palpable mass within the anterior left breast. EXAM: 2D DIGITAL DIAGNOSTIC BILATERAL MAMMOGRAM WITH CAD AND ADJUNCT TOMO ULTRASOUND  LEFT BREAST COMPARISON:  Previous exam(s). ACR Breast Density Category b: There are scattered areas of fibroglandular density. FINDINGS: Underlying the palpable marker within the anterior left breast are 2 adjacent lobular masses measuring 1.8 and 1.6 cm. Additionally, within the lateral left breast both superiorly and inferiorly are additional lobular masses. Mammographic images were processed with CAD. On physical exam, I palpate a firm mass within the central anterior left breast. Targeted ultrasound is performed, showing a 2.6 x 1.8 x 1.8 cm lobular hypoechoic mass within  the left breast 12 o'clock position 1 cm from the nipple. Within left breast 230 o'clock 3 cm from nipple there is a 2.1 x 1.0 x 1.4 cm lobular predominately solid mass with cystic components. There is suggestion of connection between these 2 masses. Within the left breast 1 o'clock position 6 cm from nipple there is a lobular 0.9 x 0.4 x 0.7 cm hypoechoic mass. Prominent left axillary lymph nodes are demonstrated. Additionally, there a few scattered cysts within the central anterior left breast. IMPRESSION: Two adjacent suspicious masses concerning for primary breast carcinoma. There is suggestion on ultrasound that these masses may be connected and represent one larger dominant mass. Additionally there are adjacent suspicious masses within the upper outer and lower outer left breast with associated dilated ducts. Prominent left axillary lymph nodes. RECOMMENDATION: Ultrasound-guided core needle biopsy dominant left breast mass for definitive characterization. Depending upon desired clinical treatment, breast MRI may be warranted to determine the possibility of multifocal malignancy. I have discussed the findings and recommendations with the patient. Results were also provided in writing at the conclusion of the visit. If applicable, a reminder letter will be sent to the patient regarding the next appointment. BI-RADS CATEGORY  5: Highly suggestive of malignancy. Electronically Signed   By: Lovey Newcomer M.D.   On: 12/20/2016 16:43   Mm Clip Placement Left  Result Date: 01/07/2017 CLINICAL DATA:  Post biopsy mammogram of the left breast for clip placement. EXAM: DIAGNOSTIC LEFT MAMMOGRAM POST ULTRASOUND BIOPSY COMPARISON:  Previous exam(s). FINDINGS: Mammographic images were obtained following ultrasound guided biopsy of left breast mass at 12 o'clock. The HydroMARK butterfly shaped biopsy marking clip is appropriately positioned within the biopsied mass at 12 o'clock in the left breast. IMPRESSION: Appropriate  positioning of the HydroMARK butterfly shaped biopsy marking clip at 12 o'clock in left breast. Final Assessment: Post Procedure Mammograms for Marker Placement Electronically Signed   By: Ammie Ferrier M.D.   On: 01/07/2017 08:58   [place] Lt Breast Bx W Loc Dev 1st Lesion Img Bx Spec US Guide  Addendum Date: 01/10/2017   ADDENDUM REPORT: 01/10/2017 15:01 ADDENDUM: Pathology of the left breast biopsy revealed LEFT BREAST, 12:00, 10 CMFN; BIOPSY: INVASIVE MAMMARY CARCINOMA. Size of invasive carcinoma: 1.15 cm in this sample. Histologic grade of invasive carcinoma: Grade 2. ER/PR/HER2: Immunohistochemistry will be performed on block A1, with reflex to [place] for HER2 2+. The results will be reported in an addendum. Comment: The diagnosis was called to Terry Hendrix at the [facility] on 01/08/17 at 12:15 PM. This was found to be concordant by Dr. Theda Sers. Recommendation: Surgical and oncology referral. There are multiple other left breast masses. Consider bilateral breast MRI for evaluation of multicentric disease per diagnostic mammogram report of 12/20/16 or additional ultrasound guided biopsies if breast conservation treatment is desired. Results and recommendations were relayed to Dr. Shearon Balo nurse Estill Bamberg on 01/09/17 by Jetta Lout, [place]. She stated that Dr. Shade Flood had given the patient the  results and requested that the nurse navigators at [facility] make the referral. The patient was contacted by Jetta Lout, RRA for a post biopsy check and to ask about a surgeon of choice. The patient stated she has done well following the biopsy. She had no preference for surgeon other than she wanted to stay in [facility] or have care at [facility]. The nurse navigators were notified who will arrange appointments. The patient's son Terry Hendrix requested that he be notified of the appointment information as he will be taking his mother to the appointments. A referral  was made with Dr. Grayland Ormond, oncologist at the [facility] clinic for 01/11/17 at 1:30 PM by Al Pimple, RN, nurse navigator for [facility]. Dr. Grayland Ormond will make the surgical referral following that visit. The patient and her son as well as Dr. Shade Flood have been notified of the appointment. Addendum by Jetta Lout, RRA on 01/11/17. Electronically Signed   By: Ammie Ferrier M.D.   On: 01/10/2017 15:01   Result Date: 01/10/2017 CLINICAL DATA:  83 year old female presenting for ultrasound-guided biopsy of the left breast mass. EXAM: ULTRASOUND GUIDED LEFT BREAST CORE NEEDLE BIOPSY COMPARISON:  Previous exam(s). FINDINGS: I met with the patient and we discussed the procedure of ultrasound-guided biopsy, including benefits and alternatives. We discussed the high likelihood of a successful procedure. We discussed the risks of the procedure, including infection, bleeding, tissue injury, clip migration, and inadequate sampling. Informed written consent was given. The usual time-out protocol was performed immediately prior to the procedure. Using sterile technique and 1% Lidocaine as local anesthetic, under direct ultrasound visualization, a 14 gauge spring-loaded device was used to perform biopsy of left breast mass 12 o'clock using a lateral approach. At the conclusion of the procedure a HydroMARK butterfly shaped tissue marker clip was deployed into the biopsy cavity. Follow up 2 view mammogram was performed and dictated separately. IMPRESSION: Ultrasound guided biopsy of left breast mass 12 o'clock. No apparent complications. Electronically Signed: By: Ammie Ferrier M.D. 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 Given her advanced age, would not recommend chemotherapy or daily XRT. Patient may tolerate surgery and a referral has been made. Would consider a full mastectomy given the multifocal nature of her disease. Whether patient chooses to undergo surgery or not, would recommend an aromatase inhibitor for a minimum of 5 years. Patient will return to clinic in several weeks to initiate an aromatase inhibitor whether or not she agrees to surgery.  Approximately 45 minutes was spent in discussion of which greater than 50% was consultation.  Patient expressed understanding and was in agreement with this plan. She also understands that She can call clinic at any time with any questions, concerns, or complaints.   Cancer Staging Primary cancer of upper outer quadrant of left female breast [facility]) Staging form: Breast, AJCC 8th Edition - Clinical stage from 01/11/2017: Stage IIA (cT2, cN1, cM0, G2, ER: Positive, PR: Positive, HER2: Equivocal) - Signed by Lloyd Huger, MD on 01/11/2017   Lloyd Huger, MD   01/11/2017 3:06 PM

## 2017-01-11 NOTE — Progress Notes (Signed)
  Oncology Nurse Navigator Documentation  Navigator Location: [facility]-Med Onc (01/10/17 1130) Referral date to RadOnc/MedOnc: 01/11/17 (01/10/17 1130) )Navigator Encounter Type: Introductory phone call;Telephone (01/10/17 1130) Telephone: Terry Hendrix Call;Incoming Call;Appt Confirmation/Clarification;Education (01/10/17 1130) Abnormal Finding Date: 12/20/16 (01/10/17 1130) Confirmed Diagnosis Date: 01/07/17 (01/10/17 1130)               Patient Visit Type: Initial (01/10/17 1130)   Barriers/Navigation Needs: Family concerns;Education;Coordination of Care (01/10/17 1130) Education: Coping with Diagnosis/ Prognosis;Understanding Cancer/ Treatment Options;Newly Diagnosed Cancer Education (01/10/17 1130) Interventions: Coordination of Care;Education;Referrals (01/10/17 1130)     Education Method: Verbal;Written (01/10/17 1130)      Acuity: Level 2 (01/10/17 1130)   Acuity Level 2: Initial guidance, education and coordination as needed;Assistance expediting appointments;Educational needs (01/10/17 1130)     Time Spent with Patient: 120 (01/10/17 1130)  Received pathology of invasive mammary carcinoma left breast.  Per Dr. Shade Hendrix physician preference profile, patient notified of results by Radiologist, and navigator to make referral to MedOnc/Surgery.  Introduced patient, and son Terry Hendrix to navigation service.  Consult with Dr. Grayland Hendrix scheduled for 01/11/17 at 1:30 in [facility].  [facility] RN to give patient Breast CancerTreatment Handbook/folder with hospital services to give to patient at appointment.  Per Dr. Grayland Hendrix, Surgical Consult, he will schedule Surgical consult after he assesses patient.

## 2017-01-14 NOTE — Progress Notes (Signed)
  Oncology Nurse Navigator Documentation  Navigator Location: [facility] Onc (01/14/17 1600) Referral date to RadOnc/MedOnc: 01/14/17 (01/14/17 1600) )Navigator Encounter Type: Telephone (01/14/17 1600) Telephone: Lahoma Crocker Call;Appt Confirmation/Clarification (01/14/17 1600)                         Education: Newly Diagnosed Cancer Education;Understanding Cancer/ Treatment Options (01/14/17 1600)                        Time Spent with Patient: 15 (01/14/17 1600)   Spoke with patient , son.  Surgical consult scheduled for 01/17/17  At 3;00with Dr. Hampton Abbot in [place] office per Reece Packer RN.  Confirmed patient understanding. Plan to meet at appointment.

## 2017-01-16 DIAGNOSIS — M1990 Unspecified osteoarthritis, unspecified site: Secondary | ICD-10-CM | POA: Insufficient documentation

## 2017-01-16 DIAGNOSIS — T7840XA Allergy, unspecified, initial encounter: Secondary | ICD-10-CM | POA: Insufficient documentation

## 2017-01-16 DIAGNOSIS — T7840XS Allergy, unspecified, sequela: Secondary | ICD-10-CM

## 2017-01-16 DIAGNOSIS — I10 Essential (primary) hypertension: Secondary | ICD-10-CM | POA: Insufficient documentation

## 2017-01-16 DIAGNOSIS — H409 Unspecified glaucoma: Secondary | ICD-10-CM | POA: Insufficient documentation

## 2017-01-16 DIAGNOSIS — D649 Anemia, unspecified: Secondary | ICD-10-CM | POA: Insufficient documentation

## 2017-01-16 DIAGNOSIS — F329 Major depressive disorder, single episode, unspecified: Secondary | ICD-10-CM | POA: Insufficient documentation

## 2017-01-16 DIAGNOSIS — E785 Hyperlipidemia, unspecified: Secondary | ICD-10-CM | POA: Insufficient documentation

## 2017-01-16 LAB — SURGICAL PATHOLOGY

## 2017-01-17 ENCOUNTER — Encounter: Payer: Self-pay | Admitting: Surgery

## 2017-01-17 ENCOUNTER — Ambulatory Visit (INDEPENDENT_AMBULATORY_CARE_PROVIDER_SITE_OTHER): Payer: Medicare HMO | Admitting: Surgery

## 2017-01-17 VITALS — BP 145/97 | HR 73 | Temp 97.8°F | Wt 153.0 lb

## 2017-01-17 DIAGNOSIS — C50412 Malignant neoplasm of upper-outer quadrant of left female breast: Secondary | ICD-10-CM | POA: Diagnosis not present

## 2017-01-17 NOTE — Patient Instructions (Signed)
Please give [place] a call if you have any questions or concerns. If you decide on doing surgery, please give [place] a call.

## 2017-01-17 NOTE — Progress Notes (Signed)
01/17/2017  Reason for Visit:  Left breast invasive cancer  History of Present Illness: Terry Hendrix is a 83 y.o. female who presents with new diagnosis of left breast cancer.  The patient noted a lump on her left breast on self-exam.  Mammogram and U/S on 12/20/16 showed two adjacent masses of the left breast measuring 2.6 cm and 2.1 cm, with possible connection between the two.  An additional 0.9 cm mass was noted on the left breast, and additional few scattered cysts.  Prominent lymph nodes were also seen in the left axilla. Biopsy of the mass revealed adenocarcinoma, ER/PR positive and HER2 positive.    She saw Dr. Grayland Ormond on 01/11/17, and recommended against chemotherapy or XRT given the patient's age.  However, he does recommend an aromatase inhibitor, and the patient was referred to our office for surgical evaluation for possible mastectomy.  Patient denies any fevers or chills, chest pain, shortness of breath, nausea or vomiting, abdominal pain, constipation or diarrhea.  Denies any weight loss.     Past Medical History: Past Medical History:  Diagnosis Date  . Allergy   . Anemia   . Arthritis   . Atrial fibrillation ([place]) 05/04/2016  . Benign essential hypertension 03/16/2014  . GIB (gastrointestinal bleeding) 05/04/2016  . Glaucoma   . HLD (hyperlipidemia)   . Hypertension   . MDD (major depressive disorder)      Past Surgical History: Past Surgical History:  Procedure Laterality Date  . COLONIC EMBOLIZATION  2014  . EYE SURGERY Bilateral    Cataract Extraction    Home Medications: Prior to Admission medications   Medication Sig Start Date End Date Taking? Authorizing Provider  atorvastatin (LIPITOR) 40 MG tablet Take 40 mg by mouth at bedtime.   Yes Historical Provider, MD  pantoprazole (PROTONIX) 40 MG tablet Take 40 mg by mouth daily.   Yes Historical Provider, MD  PARoxetine (PAXIL) 10 MG tablet Take 10 mg by mouth daily.   Yes Historical Provider, MD  travoprost,  benzalkonium, (TRAVATAN) 0.004 % ophthalmic solution Place 1 drop into both eyes at bedtime.   Yes Historical Provider, MD    Allergies: No Known Allergies  Social History:  reports that she has never smoked. She has never used smokeless tobacco. She reports that she does not drink alcohol or use drugs.   Family History: Family History  Problem Relation Age of Onset  . Breast cancer Mother   . Breast cancer Sister   . Heart disease Sister   . Heart disease Brother     Review of Systems: Review of Systems  Constitutional: Negative for chills and fever.  HENT: Negative for hearing loss.   Eyes: Negative for blurred vision.  Respiratory: Negative for cough and shortness of breath.   Cardiovascular: Negative for chest pain and leg swelling.  Gastrointestinal: Negative for abdominal pain, constipation, diarrhea, heartburn, nausea and vomiting.  Genitourinary: Negative for dysuria and hematuria.  Musculoskeletal: Negative for myalgias.  Skin: Negative for rash.  Neurological: Negative for dizziness.  Psychiatric/Behavioral: Negative for depression.  All other systems reviewed and are negative.   Physical Exam BP (!) 145/97   Pulse 73   Temp 97.8 F (36.6 C) (Oral)   Wt 69.4 kg (153 lb)   BMI 26.26 kg/m  CONSTITUTIONAL: No acute distress HEENT:  Normocephalic, atraumatic, extraocular motion intact. NECK: Trachea is midline, and there is no jugular venous distension. LYMPH NODES:  No palpable right or left axillary lymph nodes.  RESPIRATORY:  Lungs  are clear, and breath sounds are equal bilaterally. Normal respiratory effort without pathologic use of accessory muscles. CARDIOVASCULAR: Heart is regular without murmurs, gallops, or rubs. BREAST:  Three palpable masses over the left breast corresponding to the masses seen on ultrasound and mammogram. Possible that the first two masses are connected, as palpating one moves the other.   GI: The abdomen is soft, nondistended,  nontender to palpation. There were no palpable masses.  MUSCULOSKELETAL:  Normal muscle strength and tone in all four extremities.  No peripheral edema or cyanosis. SKIN: Skin turgor is normal. There are no pathologic skin lesions.  NEUROLOGIC:  Motor and sensation is grossly normal.  Cranial nerves are grossly intact. PSYCH:  Alert and oriented to person, place and time. Affect is normal.  Laboratory Analysis: No results found for this or any previous visit (from the past 24 hour(s)).  Imaging: No results found.  Assessment and Plan: This is a 83 y.o. female who presents with left breast cancer, with multiple masses over the left breast.  Would agree with Dr. Grayland Ormond that the surgical management would involve a left mastectomy given the multifocal nature of the masses.  On ultrasound, there are also prominent lymph nodes, thought not truly palpable on exam. However, given the larger size, would also perform a sentinel node biopsy for diagnostic and staging purposes.  Despite of no chemotherapy or XRT, would aid in giving the patient a more detailed prognosis.  Currently the patient appears uncertain of what she wants to do.  Lengthy discussion with the patient included options of no surgical management vs mastectomy and node biopsy.  At this point the patient appears unable to fully process the options.  Discussed with her that if we do proceed with mastectomy and node biopsy, what her post-op course would be like, including having JP drains in place, post-op pain, and mobility.  Given her medical history, there is no clear contraindication to surgery if the patient wishes to proceed.  Also discussed that if no surgery is done, the masses would continue to grow, though the aromatase inhibitor therapy would help given that she is ER/PR positive.  The patient would like to think about the options at home with her family.  She will contact us or her oncology nurse navigator with her decision.       Melvyn Neth, [place]

## 2017-01-18 ENCOUNTER — Telehealth: Payer: Self-pay

## 2017-01-18 DIAGNOSIS — Z170 Estrogen receptor positive status [ER+]: Principal | ICD-10-CM

## 2017-01-18 DIAGNOSIS — C50412 Malignant neoplasm of upper-outer quadrant of left female breast: Secondary | ICD-10-CM

## 2017-01-18 NOTE — Telephone Encounter (Signed)
Jiles Prows (son) is calling in behalf of his mother. Terry Hendrix was in the office yesterday and needed some time to think over her surgery options. Terry Hendrix has decided to go through with the Left breast removal. Please call Terry Hendrix at [phone] and advice.

## 2017-01-18 NOTE — Telephone Encounter (Signed)
Spoke with Dr. Hampton Abbot at this time. He has ordered a CT Scan Chest, Abdomen, and Pelvis; CBC and CMP.  Call made to patient's son at this time. Explained above information. He agrees with plan.  Patient scheduled at [facility] on 01/22/17. Patient to arrive at 0900am. She will have labs done while she is there.  We will have her follow-up in office with Dr. Hampton Abbot on 01/25/17 to review results from both scans and labwork.

## 2017-01-21 ENCOUNTER — Telehealth: Payer: Self-pay

## 2017-01-21 ENCOUNTER — Encounter: Payer: Self-pay | Admitting: Body Imaging

## 2017-01-21 NOTE — Telephone Encounter (Signed)
Patient has CT scan scheduled 01/22/17. Melanie from [facility] [employer] [facility] center is calling to get Authorization. Her number is [phone].

## 2017-01-21 NOTE — Progress Notes (Signed)
  Oncology Nurse Navigator Documentation  Navigator Location: [facility] (01/17/17 1600)   )Navigator Encounter Type: Education (01/17/17 1600)                           Education: Newly Diagnosed Cancer Education;Preparing for Upcoming Surgery/ Treatment (01/17/17 1600)              Acuity: Level 2 (01/17/17 1600)   Acuity Level 2: Educational needs (01/17/17 1600)     Time Spent with Patient: 30 (01/17/17 1600)  Met patient, son, at [place] prior to appointment with Dr. Hampton Abbot.  Given Breast Cancer Treatment Handbook/folder with hospital services.

## 2017-01-22 ENCOUNTER — Other Ambulatory Visit
Admission: RE | Admit: 2017-01-22 | Discharge: 2017-01-22 | Disposition: A | Discharge status: Home / Self Care | Payer: Medicare HMO | Source: Ambulatory Visit | Attending: Surgery | Admitting: Surgery

## 2017-01-22 ENCOUNTER — Ambulatory Visit
Admission: RE | Admit: 2017-01-22 | Discharge: 2017-01-22 | Disposition: A | Discharge status: Home / Self Care | Payer: Medicare HMO | Source: Ambulatory Visit | Attending: Surgery | Admitting: Surgery

## 2017-01-22 ENCOUNTER — Ambulatory Visit: Payer: Medicare HMO

## 2017-01-22 DIAGNOSIS — I700 Atherosclerosis of aorta: Secondary | ICD-10-CM | POA: Diagnosis not present

## 2017-01-22 DIAGNOSIS — C50412 Malignant neoplasm of upper-outer quadrant of left female breast: Secondary | ICD-10-CM | POA: Diagnosis not present

## 2017-01-22 DIAGNOSIS — Z170 Estrogen receptor positive status [ER+]: Secondary | ICD-10-CM | POA: Diagnosis not present

## 2017-01-22 DIAGNOSIS — I714 Abdominal aortic aneurysm, without rupture: Secondary | ICD-10-CM | POA: Diagnosis not present

## 2017-01-22 DIAGNOSIS — I2510 Atherosclerotic heart disease of native coronary artery without angina pectoris: Secondary | ICD-10-CM | POA: Diagnosis not present

## 2017-01-22 DIAGNOSIS — J90 Pleural effusion, not elsewhere classified: Secondary | ICD-10-CM | POA: Diagnosis not present

## 2017-01-22 DIAGNOSIS — I712 Thoracic aortic aneurysm, without rupture: Secondary | ICD-10-CM | POA: Insufficient documentation

## 2017-01-22 DIAGNOSIS — I517 Cardiomegaly: Secondary | ICD-10-CM | POA: Diagnosis not present

## 2017-01-22 LAB — CBC WITH DIFFERENTIAL/PLATELET
BASOS ABS: 0.1 10*3/uL (ref 0–0.1)
BASOS PCT: 1 %
EOS ABS: 0.1 10*3/uL (ref 0–0.7)
EOS PCT: 2 %
HCT: 41 % (ref 35.0–47.0)
Hemoglobin: 13.3 g/dL (ref 12.0–16.0)
LYMPHS PCT: 16 %
Lymphs Abs: 1 10*3/uL (ref 1.0–3.6)
MCH: 28.6 pg (ref 26.0–34.0)
MCHC: 32.5 g/dL (ref 32.0–36.0)
MCV: 87.9 fL (ref 80.0–100.0)
MONO ABS: 0.5 10*3/uL (ref 0.2–0.9)
Monocytes Relative: 8 %
Neutro Abs: 4.5 10*3/uL (ref 1.4–6.5)
Neutrophils Relative %: 73 %
PLATELETS: 205 10*3/uL (ref 150–440)
RBC: 4.66 MIL/uL (ref 3.80–5.20)
RDW: 15.1 % — AB (ref 11.5–14.5)
WBC: 6.2 10*3/uL (ref 3.6–11.0)

## 2017-01-22 LAB — COMPREHENSIVE METABOLIC PANEL
ALBUMIN: 3.8 g/dL (ref 3.5–5.0)
ALT: 43 U/L (ref 14–54)
AST: 44 U/L — AB (ref 15–41)
Alkaline Phosphatase: 51 U/L (ref 38–126)
Anion gap: 9 (ref 5–15)
BUN: 25 mg/dL — AB (ref 6–20)
CHLORIDE: 102 mmol/L (ref 101–111)
CO2: 28 mmol/L (ref 22–32)
CREATININE: 1.45 mg/dL — AB (ref 0.44–1.00)
Calcium: 9.6 mg/dL (ref 8.9–10.3)
GFR calc Af Amer: 36 mL/min — ABNORMAL LOW (ref >60)
GFR, EST NON AFRICAN AMERICAN: 31 mL/min — AB (ref >60)
GLUCOSE: 100 mg/dL — AB (ref 65–99)
POTASSIUM: 4.1 mmol/L (ref 3.5–5.1)
SODIUM: 139 mmol/L (ref 135–145)
Total Bilirubin: 0.5 mg/dL (ref 0.3–1.2)
Total Protein: 7.8 g/dL (ref 6.5–8.1)

## 2017-01-22 MED ORDER — IOPAMIDOL (ISOVUE-300) INJECTION 61%
100.0000 mL | Freq: Once | INTRAVENOUS | Status: AC | PRN
  Administered 2017-01-22: 80 mL via INTRAVENOUS

## 2017-01-24 ENCOUNTER — Ambulatory Visit (INDEPENDENT_AMBULATORY_CARE_PROVIDER_SITE_OTHER): Payer: Medicare HMO | Admitting: Surgery

## 2017-01-24 ENCOUNTER — Encounter: Payer: Self-pay | Admitting: Surgery

## 2017-01-24 VITALS — BP 152/80 | HR 80 | Temp 99.1°F | Ht 64.0 in | Wt 157.2 lb

## 2017-01-24 DIAGNOSIS — C50412 Malignant neoplasm of upper-outer quadrant of left female breast: Secondary | ICD-10-CM | POA: Diagnosis not present

## 2017-01-24 NOTE — Progress Notes (Signed)
01/24/2017  HPI: Patient was initially seen in the office on 01/17/17 for newly diagnosed left breast cancer. The patient was unsure whether she wanted to proceed with surgery or not.  She opted to proceed with surgery and CT scan of chest, abdomen, and pelvis were obtained.  There is no evidence of metastasis.  However, CT scan did show a descending thoracic aortic aneurysm and infrarenal abdominal aortic aneurysm with mural thrombus.  The patient also reports that sometimes her blood pressure goes to 200s at home, depending on what she eats.  No new lumps noted by patient.  No skin changes, erythema, fevers, chills, chest pain, or shortness of breath.  Vital signs: BP (!) 152/80   Pulse 80   Temp 99.1 F (37.3 C) (Oral)   Ht 5\' 4"  (1.626 m)   Wt 71.3 kg (157 lb 3.2 oz)   BMI 26.98 kg/m    Assessment/Plan: 83 yo female with left breast cancer.  --Plan for left mastectomy with sentinel node biopsy. --Patient will need cardiology clearance prior to surgery given her elevated blood pressure at home.  Currently she's not on any medications and reports that in the past when she was, she would have issues with low blood pressure.  Would like to have her pressure more stabilized prior to surgery. --Patient will also need referral to Vascular surgery for evaluation of her thoracic and abdominal aortic aneurysms to check if any procedures need to be done for this that would delay the mastectomy.  --Pending these two referrals, would tentatively schedule the patient for mid March.  Face to face time was 25 minutes, of which more than 50% was spent in education, counseling, and discussing treatment plan with patient.   Melvyn Neth, [place]

## 2017-01-24 NOTE — Patient Instructions (Addendum)
Due to the findings on your CT Scan, we will have you follow-up with Dr. Delana Meyer prior to surgery. We are also going to need clearance from a cardiologist prior to surgery due to your Blood Pressure and A-Fib.  As soon as you have seen both physicians and we have obtained clearance from your Cardiologist, we will set you up for surgery. We will call you at that time with the surgery date.

## 2017-01-28 ENCOUNTER — Telehealth: Payer: Self-pay

## 2017-01-28 NOTE — Telephone Encounter (Signed)
I have faxed a referral to Dr. Ubaldo Glassing at [facility] Phone #: [phone] Fax #: [phone] & received a confirmation.   I will follow up within 3-5 days to make sure the appointments have been scheduled.

## 2017-01-28 NOTE — Progress Notes (Signed)
  Oncology Nurse Navigator Documentation  Navigator Location: [facility] (01/28/17 1500)   )Navigator Encounter Type: Telephone (01/28/17 1500) Telephone: Appt Confirmation/Clarification;Outgoing Call;Incoming Call (01/28/17 1500)                         Education: Newly Diagnosed Cancer Education;Preparing for Upcoming Surgery/ Treatment (01/28/17 1500)              Acuity: Level 2 (01/28/17 1500)   Acuity Level 2: Assistance expediting appointments;Ongoing guidance and education throughout treatment as needed (01/28/17 1500)     Time Spent with Patient: 30 (01/28/17 1500)  Received call from Patient's son needing clarification on Vascular, and Cardiology consult appointments, for clearance prior to mastectomy.  Discussed with Shela Nevin at [place], and phone numbers for Dr. Delana Meyer, and Dr. Ubaldo Glassing with explanation that these offices should calling with consult appointments.

## 2017-01-29 ENCOUNTER — Telehealth: Payer: Self-pay

## 2017-01-29 NOTE — Telephone Encounter (Signed)
Cardiac Clearance faxed to Dr.Fath at this time.

## 2017-01-29 NOTE — Progress Notes (Signed)
  Oncology Nurse Navigator Documentation  Navigator Location: [facility] (01/29/17 1500)   )Navigator Encounter Type: Telephone (01/29/17 1500) Telephone: Terry Hendrix Call;Incoming Call;Appt Confirmation/Clarification (01/29/17 1500)                       Barriers/Navigation Needs: Coordination of Care (01/29/17 1500)   Interventions: Coordination of Care (01/29/17 1500)            Acuity: Level 2 (01/29/17 1500)   Acuity Level 2: Assistance expediting appointments (01/29/17 1500)     Time Spent with Patient: 30 (01/29/17 1500)   Patient's son called with  Cardiology, and Vascular consult appointments.  She has an appointment with Dr. Ubaldo Glassing at [facility] on 02/11/17 at 10:00, and Dr.  Delana Meyer at [place] and Vascular on 02/14/17 at 2:00.  Notified Ecologist at [facility] with request for referral, and notes.

## 2017-01-29 NOTE — Telephone Encounter (Signed)
Appointments have been made with the following facilities:   Dr. Ubaldo Glassing at [facility] 02/11/17 at 10:00 am   Dr. Delana Meyer at [place] and Vascular 02/14/17 at 2:00 pm

## 2017-02-11 DIAGNOSIS — R0602 Shortness of breath: Secondary | ICD-10-CM | POA: Insufficient documentation

## 2017-02-14 ENCOUNTER — Encounter (INDEPENDENT_AMBULATORY_CARE_PROVIDER_SITE_OTHER): Payer: Self-pay | Admitting: Vascular Surgery

## 2017-02-14 ENCOUNTER — Telehealth: Payer: Self-pay

## 2017-02-14 ENCOUNTER — Ambulatory Visit (INDEPENDENT_AMBULATORY_CARE_PROVIDER_SITE_OTHER): Payer: Medicare HMO | Admitting: Vascular Surgery

## 2017-02-14 VITALS — BP 192/118 | HR 78 | Resp 16 | Ht 63.0 in | Wt 155.0 lb

## 2017-02-14 DIAGNOSIS — I4891 Unspecified atrial fibrillation: Secondary | ICD-10-CM | POA: Diagnosis not present

## 2017-02-14 DIAGNOSIS — I714 Abdominal aortic aneurysm, without rupture, unspecified: Secondary | ICD-10-CM | POA: Insufficient documentation

## 2017-02-14 DIAGNOSIS — I712 Thoracic aortic aneurysm, without rupture, unspecified: Secondary | ICD-10-CM

## 2017-02-14 DIAGNOSIS — I10 Essential (primary) hypertension: Secondary | ICD-10-CM | POA: Diagnosis not present

## 2017-02-14 NOTE — Progress Notes (Signed)
MRN : [id number]  Terry Hendrix is a 83 y.o. (04-Jun-1927) female who presents with chief complaint of  Chief Complaint  Patient presents with  . New Patient (Initial Visit)  .  History of Present Illness:The patient presents to the office for evaluation of an abdominal aortic aneurysm and a TAA. The aneurysms were found incidentally by CT scan. Patient denies abdominal pain or unusual back pain, no other abdominal complaints.  No history of an acute onset of painful blue discoloration of the toes.     No family history of AAA.   Patient denies amaurosis fugax or TIA symptoms. There is no history of claudication or rest pain symptoms of the lower extremities.  The patient denies angina or shortness of breath.  CT scan shows an AAA that measures <4 cm and the TAA <5 cm  Current Meds  Medication Sig  . atorvastatin (LIPITOR) 40 MG tablet Take 40 mg by mouth at bedtime.  . pantoprazole (PROTONIX) 40 MG tablet Take 40 mg by mouth daily.  Marland Kitchen PARoxetine (PAXIL) 10 MG tablet Take 10 mg by mouth daily.  . travoprost, benzalkonium, (TRAVATAN) 0.004 % ophthalmic solution Place 1 drop into both eyes at bedtime.    Past Medical History:  Diagnosis Date  . Allergy   . Anemia   . Arthritis   . Atrial fibrillation ([place]) 05/04/2016  . Benign essential hypertension 03/16/2014  . GIB (gastrointestinal bleeding) 05/04/2016  . Glaucoma   . HLD (hyperlipidemia)   . Hypertension   . MDD (major depressive disorder)     Past Surgical History:  Procedure Laterality Date  . COLONIC EMBOLIZATION  2014  . EYE SURGERY Bilateral    Cataract Extraction    Social History Social History  Substance Use Topics  . Smoking status: Never Smoker  . Smokeless tobacco: Never Used  . Alcohol use No    Family History Family History  Problem Relation Age of Onset  . Breast cancer Mother   . Breast cancer Sister   . Heart disease Sister   . Heart disease Brother   No family history of  bleeding/clotting disorders, porphyria or autoimmune disease   No Known Allergies   REVIEW OF SYSTEMS (Negative unless checked)  Constitutional: [] Weight loss  [] Fever  [] Chills Cardiac: [] Chest pain   [] Chest pressure   [] Palpitations   [] Shortness of breath when laying flat   [] Shortness of breath with exertion. Vascular:  [] Pain in legs with walking   [] Pain in legs at rest  [] History of DVT   [] Phlebitis   [] Swelling in legs   [] Varicose veins   [] Non-healing ulcers Pulmonary:   [] Uses home oxygen   [] Productive cough   [] Hemoptysis   [] Wheeze  [] COPD   [] Asthma Neurologic:  [] Dizziness   [] Seizures   [] History of stroke   [] History of TIA  [] Aphasia   [] Vissual changes   [] Weakness or numbness in arm   [] Weakness or numbness in leg Musculoskeletal:   [] Joint swelling   [] Joint pain   [] Low back pain Hematologic:  [] Easy bruising  [] Easy bleeding   [] Hypercoagulable state   [] Anemic Gastrointestinal:  [] Diarrhea   [] Vomiting  [] Gastroesophageal reflux/heartburn   [] Difficulty swallowing. Genitourinary:  [] Chronic kidney disease   [] Difficult urination  [] Frequent urination   [] Blood in urine Skin:  [] Rashes   [] Ulcers  Psychological:  [] History of anxiety   []  History of major depression.  Physical Examination  Vitals:   02/14/17 1332  BP: (!) 192/118  Pulse:  78  Resp: 16  Weight: 155 lb (70.3 kg)  Height: 5\' 3"  (1.6 m)   Body mass index is 27.46 kg/m. Gen: frail/WN, NAD Head: [place]/AT, No temporalis wasting.  Ear/Nose/Throat: Hearing grossly intact, nares w/o erythema or drainage, poor dentition Eyes: PER, EOMI, sclera nonicteric.  Neck: Supple, no masses.  No bruit or JVD.  Pulmonary:  Good air movement, clear to auscultation bilaterally, no use of accessory muscles.  Cardiac: RRR, normal S1, S2, no Murmurs.  Vascular:  Mild edema  Vessel Right Left  Radial Palpable Palpable  Ulnar Palpable Palpable  Brachial Palpable Palpable  Carotid Palpable Palpable  Femoral  Palpable Palpable  Popliteal Palpable Palpable  PT Palpable Palpable  DP Palpable Palpable   Gastrointestinal: soft, non-distended. No guarding/no peritoneal signs.  Musculoskeletal: M/S 5/5 throughout.  No deformity or atrophy.  Neurologic: CN 2-12 intact. Pain and light touch intact in extremities.  Symmetrical.  Speech is fluent. Motor exam as listed above. Psychiatric: Judgment intact, Mood & affect appropriate for pt's clinical situation. Dermatologic: No rashes or ulcers noted.  No changes consistent with cellulitis. Lymph : No Cervical lymphadenopathy, no lichenification or skin changes of chronic lymphedema.  CBC Lab Results  Component Value Date   WBC 6.2 01/22/2017   HGB 13.3 01/22/2017   HCT 41.0 01/22/2017   MCV 87.9 01/22/2017   PLT 205 01/22/2017    BMET    Component Value Date/Time   NA 139 01/22/2017 0911   NA 144 01/29/2013 0455   K 4.1 01/22/2017 0911   K 3.2 (L) 01/29/2013 0455   CL 102 01/22/2017 0911   CL 112 (H) 01/29/2013 0455   CO2 28 01/22/2017 0911   CO2 24 01/29/2013 0455   GLUCOSE 100 (H) 01/22/2017 0911   GLUCOSE 98 01/29/2013 0455   BUN 25 (H) 01/22/2017 0911   BUN 5 (L) 01/29/2013 0455   CREATININE 1.45 (H) 01/22/2017 0911   CREATININE 0.84 01/29/2013 0455   CALCIUM 9.6 01/22/2017 0911   CALCIUM 8.1 (L) 01/29/2013 0455   GFRNONAA 31 (L) 01/22/2017 0911   GFRNONAA >60 01/29/2013 0455   GFRAA 36 (L) 01/22/2017 0911   GFRAA >60 01/29/2013 0455   CrCl cannot be calculated (Patient's most recent lab result is older than the maximum 21 days allowed.).  COAG Lab Results  Component Value Date   INR 1.05 05/22/2016   INR 1.13 05/04/2016    Radiology Ct Chest W Contrast  Result Date: 01/22/2017 CLINICAL DATA:  Left breast upper outer quadrant adenocarcinoma staging. EXAM: CT CHEST, ABDOMEN, AND PELVIS WITH CONTRAST TECHNIQUE: Multidetector CT imaging of the chest, abdomen and pelvis was performed following the standard protocol during  bolus administration of intravenous contrast. CONTRAST:  80 cc Isovue 300 COMPARISON:  Multiple exams, including CT abdomen from 01/25/2013. FINDINGS: CT CHEST FINDINGS Cardiovascular: Descending thoracic aortic aneurysm 4.8 cm in diameter on image 19 of series 2 with significant mural thrombus. There is also mural thrombus posteriorly in the lower thoracic aorta. I do not see a definite separate dissection flap. Aortic valve and coronary artery atherosclerotic calcification is present along with atherosclerosis of the aortic arch and proximal branch vessels. Mild to moderate cardiomegaly. Small pericardial effusion extending up in the pericardial recesses. Mediastinum/Nodes: Small thyroid nodules are present, subcentimeter. No pathologic adenopathy in the chest including the internal mammary chain. Small left axillary lymph nodes measure up to 0.6 cm in diameter on image 29/2. Lungs/Pleura: Biapical pleuroparenchymal scarring. Mild dependent atelectasis in the right lower lobe.  Mild atelectasis adjacent to the thoracic aortic aneurysm in the left lower lobe. No findings of direct metastatic disease to the lungs. Trace right pleural effusion. Trace left pleural effusion especially adjacent to the descending thoracic aortic aneurysm for example image 16/2, potentially slightly loculated. Musculoskeletal: Several rounded lesions are present in the left breast including a 2.0 by 1.4 cm left breast lesion on image 39 of series 2. CT ABDOMEN PELVIS FINDINGS Hepatobiliary: Mildly contracted gallbladder. Otherwise unremarkable. Pancreas: Unremarkable Spleen: Unremarkable Adrenals/Urinary Tract: Unremarkable Stomach/Bowel: There a few air-fluid levels in upper normal sized loops of mid abdominal small bowel without definite abnormal small bowel wall thickening. Descending colon diverticulosis. Vascular/Lymphatic: Aortoiliac atherosclerotic vascular disease. Infrarenal abdominal aortic aneurysm measuring up to 3.8 cm  transverse just above the bifurcation, and 4.1 cm anterior-posterior just above the bifurcation. Mural thrombus anteriorly. Patent celiac trunk, SMA, and IMA. No pathologic retroperitoneal adenopathy identified. Reproductive: Unremarkable Other: No supplemental non-categorized findings. Musculoskeletal: 5 mm anterolisthesis at L5-S1 associated with chronic bilateral pars defects at L5. Degenerative disc disease with vacuum disc phenomenon and loss of disc height at L5-S1, with probable bilateral foraminal impingement. IMPRESSION: 1. Left breast mass without compelling findings of metastatic disease in the chest, abdomen, or pelvis. 2. Descending thoracic aortic aneurysm 4.8 cm in diameter with significant mural thrombus. Infrarenal abdominal aortic aneurysm 4.1 cm in diameter, also with mural thrombus. Given the findings in both the chest and abdomen, vascular surgical consultation should be considered and CT follow up in 6 months time is recommended. 3. Mild to moderate cardiomegaly with coronary and aortic atherosclerosis. 4. Trace bilateral pleural effusions 5. Several air- fluid levels in upper normal sized loops of mid abdominal small bowel, could possibly represent a mild focal ileus or low-grade enteritis. 6. Bilateral chronic pars defects at L5 with 5 mm anterolisthesis and likely foraminal impingement at the L5-S1 level. Electronically Signed   By: Van Clines M.D.   On: 01/22/2017 14:44   Ct Abdomen Pelvis W Contrast  Result Date: 01/22/2017 CLINICAL DATA:  Left breast upper outer quadrant adenocarcinoma staging. EXAM: CT CHEST, ABDOMEN, AND PELVIS WITH CONTRAST TECHNIQUE: Multidetector CT imaging of the chest, abdomen and pelvis was performed following the standard protocol during bolus administration of intravenous contrast. CONTRAST:  80 cc Isovue 300 COMPARISON:  Multiple exams, including CT abdomen from 01/25/2013. FINDINGS: CT CHEST FINDINGS Cardiovascular: Descending thoracic aortic  aneurysm 4.8 cm in diameter on image 19 of series 2 with significant mural thrombus. There is also mural thrombus posteriorly in the lower thoracic aorta. I do not see a definite separate dissection flap. Aortic valve and coronary artery atherosclerotic calcification is present along with atherosclerosis of the aortic arch and proximal branch vessels. Mild to moderate cardiomegaly. Small pericardial effusion extending up in the pericardial recesses. Mediastinum/Nodes: Small thyroid nodules are present, subcentimeter. No pathologic adenopathy in the chest including the internal mammary chain. Small left axillary lymph nodes measure up to 0.6 cm in diameter on image 29/2. Lungs/Pleura: Biapical pleuroparenchymal scarring. Mild dependent atelectasis in the right lower lobe. Mild atelectasis adjacent to the thoracic aortic aneurysm in the left lower lobe. No findings of direct metastatic disease to the lungs. Trace right pleural effusion. Trace left pleural effusion especially adjacent to the descending thoracic aortic aneurysm for example image 16/2, potentially slightly loculated. Musculoskeletal: Several rounded lesions are present in the left breast including a 2.0 by 1.4 cm left breast lesion on image 39 of series 2. CT ABDOMEN PELVIS FINDINGS Hepatobiliary: Mildly contracted  gallbladder. Otherwise unremarkable. Pancreas: Unremarkable Spleen: Unremarkable Adrenals/Urinary Tract: Unremarkable Stomach/Bowel: There a few air-fluid levels in upper normal sized loops of mid abdominal small bowel without definite abnormal small bowel wall thickening. Descending colon diverticulosis. Vascular/Lymphatic: Aortoiliac atherosclerotic vascular disease. Infrarenal abdominal aortic aneurysm measuring up to 3.8 cm transverse just above the bifurcation, and 4.1 cm anterior-posterior just above the bifurcation. Mural thrombus anteriorly. Patent celiac trunk, SMA, and IMA. No pathologic retroperitoneal adenopathy identified.  Reproductive: Unremarkable Other: No supplemental non-categorized findings. Musculoskeletal: 5 mm anterolisthesis at L5-S1 associated with chronic bilateral pars defects at L5. Degenerative disc disease with vacuum disc phenomenon and loss of disc height at L5-S1, with probable bilateral foraminal impingement. IMPRESSION: 1. Left breast mass without compelling findings of metastatic disease in the chest, abdomen, or pelvis. 2. Descending thoracic aortic aneurysm 4.8 cm in diameter with significant mural thrombus. Infrarenal abdominal aortic aneurysm 4.1 cm in diameter, also with mural thrombus. Given the findings in both the chest and abdomen, vascular surgical consultation should be considered and CT follow up in 6 months time is recommended. 3. Mild to moderate cardiomegaly with coronary and aortic atherosclerosis. 4. Trace bilateral pleural effusions 5. Several air- fluid levels in upper normal sized loops of mid abdominal small bowel, could possibly represent a mild focal ileus or low-grade enteritis. 6. Bilateral chronic pars defects at L5 with 5 mm anterolisthesis and likely foraminal impingement at the L5-S1 level. Electronically Signed   By: Van Clines M.D.   On: 01/22/2017 14:44    Assessment/Plan 1. Thoracic aortic aneurysm without rupture ([place]) No surgery or intervention at this time. The patient has an asymptomatic thoracic aortic aneurysm that is less than 5 cm in maximal diameter.  I have discussed the natural history of abdominal aortic aneurysm and the small risk of rupture for aneurysm less than 6 cm in size.  However, as these small aneurysms tend to enlarge over time, continued surveillance with  CT scan is mandatory.  I have also discussed optimizing medical management with hypertension and lipid control and the importance of abstinence from tobacco.  The patient is also encouraged to exercise a minimum of 30 minutes 4 times a week.  Should the patient develop new onset abdominal  or back pain or signs of peripheral embolization they are instructed to seek medical attention immediately and to alert the physician providing care that they have an aneurysm.  The patient voices their understanding. The patient will return in 12 months with an aortic duplex.  - CT ANGIO CHEST PE W OR WO CONTRAST; Future  2. AAA (abdominal aortic aneurysm) without rupture ([facility]) No surgery or intervention at this time. The patient has an asymptomatic abdominal aortic aneurysm that is less than 4 cm in maximal diameter.  I have discussed the natural history of abdominal aortic aneurysm and the small risk of rupture for aneurysm less than 5 cm in size.  However, as these small aneurysms tend to enlarge over time, continued surveillance with ultrasound or CT scan is mandatory.  I have also discussed optimizing medical management with hypertension and lipid control and the importance of abstinence from tobacco.  The patient is also encouraged to exercise a minimum of 30 minutes 4 times a week.  Should the patient develop new onset abdominal or back pain or signs of peripheral embolization they are instructed to seek medical attention immediately and to alert the physician providing care that they have an aneurysm.  The patient voices their understanding. The patient will return  in 12 months with an aortic duplex.  - CT ANGIO ABDOMEN W &/OR WO CONTRAST; Future  3. Atrial fibrillation, unspecified type ([place]) Continue antiarrhythmia medications as already ordered, these medications have been reviewed and there are no changes at this time.  Continue anticoagulation as ordered by Cardiology Service   4. Essential hypertension Continue antihypertensive medications as already ordered, these medications have been reviewed and there are no changes at this time.     Hortencia Pilar, MD  02/14/2017 5:18 PM

## 2017-02-14 NOTE — Telephone Encounter (Signed)
Spoke with Betsy at DR.Fath's office. Patient has scheduled Stress test and Echocardiogram on 02/19/17 and follow up with DR.Fath on 02/21/17.  Re-faxed Cardiac Clearance form at this time.

## 2017-02-26 ENCOUNTER — Telehealth: Payer: Self-pay

## 2017-02-26 NOTE — Telephone Encounter (Signed)
Medical Clearance faxed to Dr.Gregory Schnier at this time.

## 2017-03-12 ENCOUNTER — Telehealth: Payer: Self-pay

## 2017-03-12 ENCOUNTER — Other Ambulatory Visit: Payer: Self-pay | Admitting: Cardiology

## 2017-03-12 ENCOUNTER — Other Ambulatory Visit: Payer: Self-pay

## 2017-03-12 ENCOUNTER — Emergency Department: Payer: Medicare HMO

## 2017-03-12 ENCOUNTER — Emergency Department
Admission: EM | Admit: 2017-03-12 | Discharge: 2017-03-12 | Disposition: A | Discharge status: Home / Self Care | Payer: Medicare HMO | Attending: Emergency Medicine | Admitting: Emergency Medicine

## 2017-03-12 DIAGNOSIS — R51 Headache: Secondary | ICD-10-CM | POA: Insufficient documentation

## 2017-03-12 DIAGNOSIS — I10 Essential (primary) hypertension: Secondary | ICD-10-CM | POA: Diagnosis not present

## 2017-03-12 DIAGNOSIS — R519 Headache, unspecified: Secondary | ICD-10-CM

## 2017-03-12 MED ORDER — ACETAMINOPHEN 500 MG PO TABS
1000.0000 mg | ORAL_TABLET | Freq: Once | ORAL | Status: AC
  Administered 2017-03-12: 1000 mg via ORAL
  Filled 2017-03-12: qty 2

## 2017-03-12 MED ORDER — PROCHLORPERAZINE EDISYLATE 5 MG/ML IJ SOLN
10.0000 mg | Freq: Once | INTRAMUSCULAR | Status: DC
  Filled 2017-03-12: qty 2

## 2017-03-12 NOTE — ED Notes (Signed)
Pt arrived back from CT, states she needs to use BR. Pt ambulated without difficulty to BR in rm. Pt denies dizziness. Pt back in bed resting.

## 2017-03-12 NOTE — Discharge Instructions (Signed)
Please follow-up with your primary care physician in one week for recheck. Return to the emergency department for any concerns.  It was a pleasure to take care of you today, and thank you for coming to our emergency department.  If you have any questions or concerns before leaving please ask the nurse to grab me and I'm more than happy to go through your aftercare instructions again.  If you were prescribed any opioid pain medication today such as Norco, Vicodin, Percocet, morphine, hydrocodone, or oxycodone please make sure you do not drive when you are taking this medication as it can alter your ability to drive safely.  If you have any concerns once you are home that you are not improving or are in fact getting worse before you can make it to your follow-up appointment, please do not hesitate to call 911 and come back for further evaluation.  Darel Hong MD  Results for orders placed or performed during the hospital encounter of 01/22/17  CBC with Differential  Result Value Ref Range   WBC 6.2 3.6 - 11.0 K/uL   RBC 4.66 3.80 - 5.20 MIL/uL   Hemoglobin 13.3 12.0 - 16.0 g/dL   HCT 41.0 35.0 - 47.0 %   MCV 87.9 80.0 - 100.0 fL   MCH 28.6 26.0 - 34.0 pg   MCHC 32.5 32.0 - 36.0 g/dL   RDW 15.1 (H) 11.5 - 14.5 %   Platelets 205 150 - 440 K/uL   Neutrophils Relative % 73 %   Neutro Abs 4.5 1.4 - 6.5 K/uL   Lymphocytes Relative 16 %   Lymphs Abs 1.0 1.0 - 3.6 K/uL   Monocytes Relative 8 %   Monocytes Absolute 0.5 0.2 - 0.9 K/uL   Eosinophils Relative 2 %   Eosinophils Absolute 0.1 0 - 0.7 K/uL   Basophils Relative 1 %   Basophils Absolute 0.1 0 - 0.1 K/uL  Comprehensive metabolic panel  Result Value Ref Range   Sodium 139 135 - 145 mmol/L   Potassium 4.1 3.5 - 5.1 mmol/L   Chloride 102 101 - 111 mmol/L   CO2 28 22 - 32 mmol/L   Glucose, Bld 100 (H) 65 - 99 mg/dL   BUN 25 (H) 6 - 20 mg/dL   Creatinine, Ser 1.45 (H) 0.44 - 1.00 mg/dL   Calcium 9.6 8.9 - 10.3 mg/dL   Total  Protein 7.8 6.5 - 8.1 g/dL   Albumin 3.8 3.5 - 5.0 g/dL   AST 44 (H) 15 - 41 U/L   ALT 43 14 - 54 U/L   Alkaline Phosphatase 51 38 - 126 U/L   Total Bilirubin 0.5 0.3 - 1.2 mg/dL   GFR calc non Af Amer 31 (L) >60 mL/min   GFR calc Af Amer 36 (L) >60 mL/min   Anion gap 9 5 - 15   Ct Head Wo Contrast  Result Date: 03/12/2017 CLINICAL DATA:  83 y/o  F; headache and hypertension. EXAM: CT HEAD WITHOUT CONTRAST TECHNIQUE: Contiguous axial images were obtained from the base of the skull through the vertex without intravenous contrast. COMPARISON:  02/13/2014 CT of the head. FINDINGS: Brain: No evidence of acute infarction, hemorrhage, hydrocephalus, extra-axial collection or mass lesion/mass effect. Stable nonspecific foci of hypoattenuation in subcortical and periventricular white matter compatible with moderate chronic microvascular ischemic changes. Moderate brain parenchymal volume loss. Vascular: Mild calcific atherosclerosis of cavernous and paraclinoid internal carotid arteries. Skull: Normal. Negative for fracture or focal lesion. Sinuses/Orbits: No acute finding. Other:  None. IMPRESSION: 1. No acute intracranial abnormality. 2. Stable moderate chronic microvascular ischemic changes and moderate parenchymal volume loss of the brain. Electronically Signed   By: Kristine Garbe M.D.   On: 03/12/2017 06:35

## 2017-03-12 NOTE — ED Triage Notes (Signed)
Per EMS, pt reports headache since last night. Pt also reports elevated BP. EMS states pt's BP was 191/117 during transport. Pt states hx of HTN. Pt A&O at this time.

## 2017-03-12 NOTE — ED Notes (Signed)
Patient transported to CT 

## 2017-03-12 NOTE — Telephone Encounter (Signed)
Vascular Clearance obtained from [place] at this time and will be scanned under Media.

## 2017-03-12 NOTE — ED Provider Notes (Signed)
[facility] Emergency Department Provider Note  ____________________________________________   First MD Initiated Contact with Patient 03/12/17 0602     (approximate)  I have reviewed the triage vital signs and the nursing notes.   HISTORY  Chief Complaint Migraine    HPI Terry Hendrix is a 83 y.o. female who comes to the emergency department via EMS for gradual onset not maximal onset bilateral frontalaching throbbing headache similar to previous headaches. It began last night while she was in bed and persisted throughout the night. This morning she checked her blood pressure at home and noted it was over 200 which concerned her so she called 911.   Past Medical History:  Diagnosis Date  . Allergy   . Anemia   . Arthritis   . Atrial fibrillation ([place]) 05/04/2016  . Benign essential hypertension 03/16/2014  . GIB (gastrointestinal bleeding) 05/04/2016  . Glaucoma   . HLD (hyperlipidemia)   . Hypertension   . MDD (major depressive disorder)     Patient Active Problem List   Diagnosis Date Noted  . Aneurysm of thoracic aorta ([place]) 02/14/2017  . AAA (abdominal aortic aneurysm) without rupture ([place]) 02/14/2017  . Anemia   . MDD (major depressive disorder)   . Glaucoma   . HLD (hyperlipidemia)   . Hypertension   . Allergy   . Arthritis   . Primary cancer of upper outer quadrant of left female breast ([place]) 01/11/2017  . GIB (gastrointestinal bleeding) 05/04/2016  . Atrial fibrillation ([place]) 05/04/2016  . Right shoulder pain 04/18/2016  . Benign essential hypertension 03/16/2014    Past Surgical History:  Procedure Laterality Date  . COLONIC EMBOLIZATION  2014  . EYE SURGERY Bilateral    Cataract Extraction    Prior to Admission medications   Medication Sig Start Date End Date Taking? Authorizing Provider  atorvastatin (LIPITOR) 40 MG tablet Take 40 mg by mouth at bedtime.    Historical Provider, MD  pantoprazole (PROTONIX) 40 MG  tablet Take 40 mg by mouth daily.    Historical Provider, MD  PARoxetine (PAXIL) 10 MG tablet Take 10 mg by mouth daily.    Historical Provider, MD  travoprost, benzalkonium, (TRAVATAN) 0.004 % ophthalmic solution Place 1 drop into both eyes at bedtime.    Historical Provider, MD    Allergies Patient has no known allergies.  Family History  Problem Relation Age of Onset  . Breast cancer Mother   . Breast cancer Sister   . Heart disease Sister   . Heart disease Brother     Social History Social History  Substance Use Topics  . Smoking status: Never Smoker  . Smokeless tobacco: Never Used  . Alcohol use No    Review of Systems Constitutional: No fever/chills Eyes: No visual changes. ENT: No sore throat. Cardiovascular: Denies chest pain. Respiratory: Denies shortness of breath. Gastrointestinal: No abdominal pain.  No nausea, no vomiting.  No diarrhea.  No constipation. Genitourinary: Negative for dysuria. Musculoskeletal: Negative for back pain. Skin: Negative for rash. Neurological: Positive for headache  10-point ROS otherwise negative.  ____________________________________________   PHYSICAL EXAM:  VITAL SIGNS: ED Triage Vitals  Enc Vitals Group     BP      Pulse      Resp      Temp      Temp src      SpO2      Weight      Height      Head Circumference  Peak Flow      Pain Score      Pain Loc      Pain Edu?      Excl. in [place]?     Constitutional: Alert and oriented x 4 well appearing nontoxic no diaphoresis speaks in full, clear sentences Eyes: PERRL EOMI. Head: Atraumatic. Nose: No congestion/rhinnorhea. Mouth/Throat: No trismus Neck: No stridor.   Cardiovascular: Normal rate, regular rhythm. Grossly normal heart sounds.  Good peripheral circulation. Respiratory: Normal respiratory effort.  No retractions. Lungs CTAB and moving good air Gastrointestinal: Soft nondistended nontender no rebound no guarding no peritonitis no McBurney's  tenderness negative Rovsing's no costovertebral tenderness negative Murphy's Musculoskeletal: No lower extremity edema   Neurologic:  Normal speech and language. No gross focal neurologic deficits are appreciated. Skin:  Skin is warm, dry and intact. No rash noted. Psychiatric: Mood and affect are normal. Speech and behavior are normal.    ____________________________________________   DIFFERENTIAL  Just through hemorrhage, subarachnoid hemorrhage, migraine headache, tension headache ____________________________________________   LABS (all labs ordered are listed, but only abnormal results are displayed)  Labs Reviewed - No data to display   __________________________________________  EKG  ED ECG REPORT I, Darel Hong, the attending physician, personally viewed and interpreted this ECG.  Date: 03/12/2017 Rate: 81 Rhythm: normal sinus rhythm QRS Axis: normal Intervals: normal ST/T Wave abnormalities: normal Conduction Disturbances: none Narrative Interpretation: unremarkable  ____________________________________________  RADIOLOGY  Head CT with no acute pathology ____________________________________________   PROCEDURES  Procedure(s) performed: no  Procedures  Critical Care performed: no  ____________________________________________   INITIAL IMPRESSION / ASSESSMENT AND PLAN / ED COURSE  Pertinent labs & imaging results that were available during my care of the patient were reviewed by me and considered in my medical decision making (see chart for details).  After 1 g of Tylenol the patient's headache is nearly resolved. Head CT obtained given her advanced age and fortunately is negative for acute pathology. She remains neurologically intact. She feels comfortable returning home with primary care follow-up. She is discharged home in improved condition.      ____________________________________________   FINAL CLINICAL IMPRESSION(S) / ED  DIAGNOSES  Final diagnoses:  Acute nonintractable headache, unspecified headache type  Hypertension, unspecified type      NEW MEDICATIONS STARTED DURING THIS VISIT:  New Prescriptions   No medications on file     Note:  This document was prepared using Dragon voice recognition software and may include unintentional dictation errors.     Darel Hong, MD 03/12/17 [phone]

## 2017-03-13 ENCOUNTER — Ambulatory Visit
Admission: RE | Admit: 2017-03-13 | Discharge: 2017-03-13 | Disposition: A | Discharge status: Home / Self Care | Payer: Medicare HMO | Source: Ambulatory Visit | Attending: Cardiology | Admitting: Cardiology

## 2017-03-13 ENCOUNTER — Encounter
Admission: RE | Disposition: A | Discharge status: Home / Self Care | Payer: Self-pay | Source: Ambulatory Visit | Attending: Cardiology

## 2017-03-13 ENCOUNTER — Other Ambulatory Visit: Payer: Self-pay | Admitting: Cardiology

## 2017-03-13 ENCOUNTER — Encounter: Payer: Self-pay | Admitting: *Deleted

## 2017-03-13 DIAGNOSIS — D519 Vitamin B12 deficiency anemia, unspecified: Secondary | ICD-10-CM | POA: Diagnosis not present

## 2017-03-13 DIAGNOSIS — I2510 Atherosclerotic heart disease of native coronary artery without angina pectoris: Secondary | ICD-10-CM | POA: Insufficient documentation

## 2017-03-13 DIAGNOSIS — D472 Monoclonal gammopathy: Secondary | ICD-10-CM | POA: Insufficient documentation

## 2017-03-13 DIAGNOSIS — R0609 Other forms of dyspnea: Secondary | ICD-10-CM | POA: Diagnosis not present

## 2017-03-13 DIAGNOSIS — M1990 Unspecified osteoarthritis, unspecified site: Secondary | ICD-10-CM | POA: Diagnosis not present

## 2017-03-13 DIAGNOSIS — R531 Weakness: Secondary | ICD-10-CM | POA: Diagnosis not present

## 2017-03-13 DIAGNOSIS — E785 Hyperlipidemia, unspecified: Secondary | ICD-10-CM | POA: Insufficient documentation

## 2017-03-13 DIAGNOSIS — I10 Essential (primary) hypertension: Secondary | ICD-10-CM | POA: Diagnosis not present

## 2017-03-13 DIAGNOSIS — Z01810 Encounter for preprocedural cardiovascular examination: Secondary | ICD-10-CM | POA: Insufficient documentation

## 2017-03-13 DIAGNOSIS — Z8249 Family history of ischemic heart disease and other diseases of the circulatory system: Secondary | ICD-10-CM | POA: Diagnosis not present

## 2017-03-13 HISTORY — PX: LEFT HEART CATH AND CORONARY ANGIOGRAPHY: CATH118249

## 2017-03-13 SURGERY — LEFT HEART CATH AND CORONARY ANGIOGRAPHY
Anesthesia: Moderate Sedation

## 2017-03-13 SURGERY — LEFT HEART CATH AND CORONARY ANGIOGRAPHY
Anesthesia: Moderate Sedation | Laterality: Left

## 2017-03-13 MED ORDER — SODIUM CHLORIDE 0.9 % IV SOLN: 250.0000 mL | INTRAVENOUS | Status: DC | PRN

## 2017-03-13 MED ORDER — AMLODIPINE BESYLATE 5 MG PO TABS: 5.0000 mg | ORAL_TABLET | Freq: Every day | ORAL | Status: DC

## 2017-03-13 MED ORDER — SODIUM CHLORIDE 0.9 % WEIGHT BASED INFUSION: 1.0000 mL/kg/h | INTRAVENOUS | Status: DC

## 2017-03-13 MED ORDER — NITROGLYCERIN 0.4 MG SL SUBL
SUBLINGUAL_TABLET | SUBLINGUAL | Status: DC | PRN
  Administered 2017-03-13 (×2): .4 mg via SUBLINGUAL

## 2017-03-13 MED ORDER — FENTANYL CITRATE (PF) 100 MCG/2ML IJ SOLN
INTRAMUSCULAR | Status: DC | PRN
  Administered 2017-03-13: 25 ug via INTRAVENOUS

## 2017-03-13 MED ORDER — ASPIRIN 81 MG PO CHEW: 81.0000 mg | CHEWABLE_TABLET | ORAL | Status: DC

## 2017-03-13 MED ORDER — HEPARIN (PORCINE) IN NACL 2-0.9 UNIT/ML-% IJ SOLN
INTRAMUSCULAR | Status: AC
  Filled 2017-03-13: qty 1000

## 2017-03-13 MED ORDER — AMLODIPINE BESYLATE 5 MG PO TABS
5.0000 mg | ORAL_TABLET | Freq: Every day | ORAL | Status: DC
  Administered 2017-03-13: 5 mg via ORAL

## 2017-03-13 MED ORDER — SODIUM CHLORIDE 0.9% FLUSH: 3.0000 mL | INTRAVENOUS | Status: DC | PRN

## 2017-03-13 MED ORDER — NITROGLYCERIN 2 % TD OINT
TOPICAL_OINTMENT | TRANSDERMAL | Status: AC
  Filled 2017-03-13: qty 1

## 2017-03-13 MED ORDER — LIDOCAINE HCL (PF) 1 % IJ SOLN
INTRAMUSCULAR | Status: AC
  Filled 2017-03-13: qty 30

## 2017-03-13 MED ORDER — MIDAZOLAM HCL 2 MG/2ML IJ SOLN
INTRAMUSCULAR | Status: AC
  Filled 2017-03-13: qty 2

## 2017-03-13 MED ORDER — LIDOCAINE HCL (PF) 1 % IJ SOLN
INTRAMUSCULAR | Status: DC | PRN
  Administered 2017-03-13: 20 mL

## 2017-03-13 MED ORDER — SODIUM CHLORIDE 0.9% FLUSH: 3.0000 mL | Freq: Two times a day (BID) | INTRAVENOUS | Status: DC

## 2017-03-13 MED ORDER — NITROGLYCERIN 0.4 MG SL SUBL
SUBLINGUAL_TABLET | SUBLINGUAL | Status: AC
  Filled 2017-03-13: qty 1

## 2017-03-13 MED ORDER — NITROGLYCERIN 2 % TD OINT
TOPICAL_OINTMENT | TRANSDERMAL | Status: DC | PRN
  Administered 2017-03-13: 1 [in_us] via TOPICAL

## 2017-03-13 MED ORDER — FENTANYL CITRATE (PF) 100 MCG/2ML IJ SOLN
INTRAMUSCULAR | Status: AC
  Filled 2017-03-13: qty 2

## 2017-03-13 MED ORDER — MIDAZOLAM HCL 2 MG/2ML IJ SOLN
INTRAMUSCULAR | Status: DC | PRN
  Administered 2017-03-13: 1 mg via INTRAVENOUS

## 2017-03-13 MED ORDER — SODIUM CHLORIDE 0.9 % WEIGHT BASED INFUSION
3.0000 mL/kg/h | INTRAVENOUS | Status: DC
  Administered 2017-03-13: 3 mL/kg/h via INTRAVENOUS

## 2017-03-13 MED ORDER — AMLODIPINE BESYLATE 5 MG PO TABS
ORAL_TABLET | ORAL | Status: AC
  Administered 2017-03-13: 5 mg via ORAL
  Filled 2017-03-13: qty 1

## 2017-03-13 MED ORDER — MIDAZOLAM HCL 2 MG/ML PO SYRP
ORAL_SOLUTION | ORAL | Status: AC
  Administered 2017-03-13: 2 mg via ORAL
  Filled 2017-03-13: qty 4

## 2017-03-13 MED ORDER — MIDAZOLAM HCL 2 MG/ML PO SYRP
2.0000 mg | ORAL_SOLUTION | Freq: Once | ORAL | Status: AC
  Administered 2017-03-13: 2 mg via ORAL

## 2017-03-13 MED ORDER — IOPAMIDOL (ISOVUE-300) INJECTION 61%
INTRAVENOUS | Status: DC | PRN
  Administered 2017-03-13: 65 mL via INTRA_ARTERIAL

## 2017-03-13 SURGICAL SUPPLY — 10 items
CATH 5FR JR4 DIAGNOSTIC (CATHETERS) ×3 IMPLANT
CATH INFINITI 5FR ANG PIGTAIL (CATHETERS) ×3 IMPLANT
CATH INFINITI 5FR JL4 (CATHETERS) ×3 IMPLANT
CATH INFINITI 5FR JL5 (CATHETERS) ×3 IMPLANT
DEVICE CLOSURE MYNXGRIP 5F (Vascular Products) ×3 IMPLANT
KIT MANI 3VAL PERCEP (MISCELLANEOUS) ×3 IMPLANT
NEEDLE PERC 18GX7CM (NEEDLE) ×3 IMPLANT
PACK CARDIAC CATH (CUSTOM PROCEDURE TRAY) ×3 IMPLANT
SHEATH AVANTI 5FR X 11CM (SHEATH) ×3 IMPLANT
WIRE EMERALD 3MM-J .035X150CM (WIRE) ×3 IMPLANT

## 2017-03-13 NOTE — Discharge Instructions (Signed)
CHANGE AMLODIPINE (NORVASC) TO 5MG  DAILY STARTING 03/14/2017

## 2017-03-13 NOTE — Progress Notes (Addendum)
Patient expressing some anxiety about procedure. States that her BP elevates with anxiety. Telephone Fath to notify of anxiety and BP, order for PO versed. Administered, will monitor.

## 2017-03-13 NOTE — Progress Notes (Signed)
Dr Ubaldo Glassing notified of patient's elevated BP. Patient states she is nervous and her BP elevated with nervousness. Orders received. Tiburcio Pea, RN

## 2017-03-13 NOTE — H&P (Signed)
Chief Complaint: Chief Complaint  Patient presents with  . Follow-up  had myoview and echo for preop clearancefor a left breast mastecomy I m doing okay having no problems  Date of Service: 03/07/2017 Date of Birth: 23-Sep-1927 PCP: Petra Kuba, MD  History of Present Illness: Terry Hendrix is a 83 y.o.female patient who attends for evaluation and preoperative risk assessment prior to elective breast surgery. Patient has history of hypertension but denies tobacco use, diabetes. She has hyperlipidemia treated with atorvastatin. She is limited in her activity due to lower extremity weakness. She does have some dyspnea on exertion. Patient underwent an echocardiogram revealing preserved LV function. There was mild MR mild TR. She did have evidence of mild to moderate anterior ischemia on her functional study consistent with a moderate to high risk study. We discussed risks and benefits of cardiac cath as well as risks of semi-elective surgery with an abnormal functional study especially in the anterior distribution. Past Medical and Surgical History  Past Medical History Past Medical History:  Diagnosis Date  . Anemia, unspecified  . B12 deficiency anemia  . Carpal tunnel syndrome  . Cataract  . Chlamydia contact 1980  . Dizziness  . Heart palpitations  . History of GI diverticular bleed  . History of shingles  . Hyperlipidemia, unspecified  . Hypertension  . IgA monoclonal gammopathy  followed by Dr. Inez Pilgrim  . Osteoarthritis  . Seasonal allergies  . Trichomonas exposure 1980   Past Surgical History She has a past surgical history that includes Colonoscopy and Cataract extraction (Bilateral).   Medications and Allergies  Current Medications  Current Outpatient Prescriptions  Medication Sig Dispense Refill  . atorvastatin (LIPITOR) 40 MG tablet Take 40 mg by mouth once daily.  . pantoprazole (PROTONIX) 40 MG DR tablet Take 40 mg by mouth once daily.  Marland Kitchen PARoxetine (PAXIL) 10 MG  tablet Take 10 mg by mouth once daily.  . travoprost, benzalkonium, (TRAVATAN) 0.004 % ophthalmic solution 1 drop nightly.   No current facility-administered medications for this visit.   Allergies: Patient has no known allergies.  Social and Family History  Social History reports that she has never smoked. She has never used smokeless tobacco. She reports that she does not drink alcohol.  Family History Family History  Problem Relation Age of Onset  . Myocardial Infarction (Heart attack) Sister  . Myocardial Infarction (Heart attack) Brother  . Breast cancer Mother   Review of Systems  Review of Systems  Constitutional: Negative for chills, diaphoresis, fever, malaise/fatigue and weight loss.  HENT: Negative for congestion, ear discharge, hearing loss and tinnitus.  Eyes: Negative for blurred vision.  Respiratory: Positive for shortness of breath. Negative for cough, hemoptysis, sputum production and wheezing.  Cardiovascular: Positive for chest pain. Negative for palpitations, orthopnea, claudication, leg swelling and PND.  Gastrointestinal: Negative for abdominal pain, blood in stool, constipation, diarrhea, heartburn, melena, nausea and vomiting.  Genitourinary: Negative for dysuria, frequency, hematuria and urgency.  Musculoskeletal: Negative for back pain, falls, joint pain and myalgias.  Skin: Negative for itching and rash.  Neurological: Negative for dizziness, tingling, focal weakness, loss of consciousness, weakness and headaches.  Endo/Heme/Allergies: Negative for polydipsia. Does not bruise/bleed easily.  Psychiatric/Behavioral: Negative for depression, memory loss and substance abuse. The patient is not nervous/anxious.   Physical Examination   Vitals:BP (!) 162/100  Pulse 72  Resp 12  Ht 157.5 cm (5\' 2" )  Wt 70.4 kg (155 lb 3.2 oz)  BMI 28.39 kg/m  Ht:157.5 cm (5'  2") Wt:70.4 kg (155 lb 3.2 oz) TGP:QDIY surface area is 1.75 meters squared. Body mass index is  28.39 kg/m.  Wt Readings from Last 3 Encounters:  03/07/17 70.4 kg (155 lb 3.2 oz)  02/11/17 70.8 kg (156 lb)  04/18/16 71.2 kg (157 lb)   BP Readings from Last 3 Encounters:  03/07/17 (!) 162/100  02/11/17 (!) 138/90  04/18/16 (!) 160/94   General appearance appears in no acute distress  Head Mouth and Eye exam Normocephalic, without obvious abnormality, atraumatic Dentition is good Eyes appear anicteric   Neck exam Thyroid: normal  Nodes: no obvious adenopathy  LUNGS Breath Sounds: Normal Percussion: Normal  CARDIOVASCULAR JVP CV wave: no HJR: no Elevation at 90 degrees: None Carotid Pulse: normal pulsation bilaterally Bruit: None Apex: apical impulse normal  Auscultation Rhythm: normal sinus rhythm S1: normal S2: normal Clicks: no Rub: no Murmurs: 1/6 to 2/6 systolic murmur Gallop: None ABDOMEN Liver enlargement: no Pulsatile aorta: no Ascites: no Bruits: no  EXTREMITIES Clubbing: no Edema: 1+ bilateral pedal edema Pulses: peripheral pulses symmetrical Femoral Bruits: no Amputation: no SKIN Rash: no Cyanosis: no Embolic phemonenon: no Bruising: no NEURO Alert and Oriented to person, place and time: yes Non focal: yes  PSYCH: Pt appears to have normal affect  Diagnostic Studies Reviewed:  EKG EKG demonstrated normal sinus rhythm, nonspecific ST and T waves changes.  Assessment and Plan   83 y.o. female with  ICD-10-CM ICD-9-CM  1. Pre-op exam-patient being scheduled for breast surgery. Baseline electrocardiogram has nonspecific changes. She does have some dyspnea on exertion and is limited in exertion due to lower extremity weakness. Functional study showed probable mild anterior ischemia. Risk metastases of left cardiac catheter explained to the patient and he agreed to proceed. We will proceed with cardiac cath to evaluate coronary anatomy the patient prior to semielective surgery and a high risk functional study. Z01.818 V72.84      2. SOB (shortness of breath)-as per above-echocardiogram shows normal LV function. R06.02 786.05    3. Dyspnea on exertion-as per above R06.09 786.09   4. Essential hypertension, benign-continue with current regimen as well as DASH diet I10 401.1   5. Hyperlipidemia-continue with statin atorvastatin 40 mg daily. Low-fat diet is recommended. LDL goal less than 100 is recommended.  Return in about 4 weeks (around 04/04/2017).  These notes generated with voice recognition software. I apologize for typographical errors.  Terry Levans, MD    Pt seen and examined. No changes from above.

## 2017-03-14 ENCOUNTER — Encounter: Payer: Self-pay | Admitting: Cardiology

## 2017-03-14 ENCOUNTER — Telehealth: Payer: Self-pay

## 2017-03-14 NOTE — Telephone Encounter (Signed)
Cardiac Clearance obtained from Dr.Fath  and will be scanned under Media.

## 2017-03-19 ENCOUNTER — Telehealth: Payer: Self-pay | Admitting: Surgery

## 2017-03-19 NOTE — Progress Notes (Signed)
  Oncology Nurse Navigator Documentation  Navigator Location: [facility] (03/19/17 1000)   )Navigator Encounter Type: Telephone (03/19/17 1000) Telephone: Incoming Call;Outgoing Call;Appt Confirmation/Clarification (03/19/17 1000)                     Treatment Phase: Follow-up (03/19/17 1000) Barriers/Navigation Needs: Coordination of Care (03/19/17 1000)   Interventions: Coordination of Care (03/19/17 1000)   Coordination of Care: Appts (03/19/17 1000)                  Time Spent with Patient: 30 (03/19/17 1000)  Patient's son called stating they are waiting for patient's breast surgery to be scheduled.  Per son she received cardiac clearance last week.  Notified [facility], and they are to call patient, and her son today with Appointment.

## 2017-03-19 NOTE — Telephone Encounter (Signed)
Would like to know when her surgery can be scheduled.

## 2017-03-20 NOTE — Telephone Encounter (Signed)
Patients coming in on 04/03/17

## 2017-03-20 NOTE — Telephone Encounter (Signed)
Please call patient and place on schedule next available to discuss scheduling surgery. All clearances have been obtained.

## 2017-03-21 NOTE — Progress Notes (Signed)
  Oncology Nurse Navigator Documentation  Navigator Location: [facility] (03/20/17 1630)   )Navigator Encounter Type: Telephone (03/20/17 1630) Telephone: Incoming Call;Outgoing Call;Appt Confirmation/Clarification (03/20/17 1630)                     Treatment Phase: Follow-up (03/20/17 1630) Barriers/Navigation Needs: Coordination of Care (03/20/17 1630)   Interventions: Coordination of Care (03/20/17 1630)   Coordination of Care: Appts (03/20/17 1630)                  Time Spent with Patient: 30 (03/20/17 1630)  Patient's son called.  Had not received appoitntment for patient's surgery.  States  Patient is getting confused about her surgery date.  Spoke to [facility] scheduled with Dr. Hampton Abbot on 04/03/17 a 3:30. Patient ,and son notified of appointment.

## 2017-04-03 ENCOUNTER — Ambulatory Visit: Payer: Medicare HMO | Admitting: Surgery

## 2017-04-03 ENCOUNTER — Telehealth: Payer: Self-pay

## 2017-04-03 NOTE — Telephone Encounter (Signed)
Patient walked in at this time in preparation for her appointment scheduled today and would like to speak with Terry Hendrix prior to checking in for her appointment as she does not wish to follow through with surgery at this time.   Terry Hendrix had a conversation with the patient and it was decided by both surgeon and patient / patient's son that she will be seen by Terry Hendrix once again to discuss "taking the pill" instead of going through with surgery. She is referring to the hormone therapy that she previously discussed with Terry Hendrix.   Spoke with Brooke from the [facility] [employer] at this time. She has made an appointment for patient to come in and discuss this with Terry Hendrix in [place] on Friday, 04/05/17 at 1015am. Patient was given all appointment information today.  Her appointment with [place] was cancelled today per Terry Hendrix.

## 2017-04-03 NOTE — Progress Notes (Signed)
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  SHe was evaluated by surgery and ultimately declined surgical intervention. Currently, patient feels well and is asymptomatic. She has no neurologic complaints. She denies any recent fevers. She has a good appetite and denies weight loss. She has no chest pain or shortness of breath. She denies any nausea, vomiting, constipation, or diarrhea. She has no urinary complaints. Patient offers no specific complaints today.  REVIEW OF SYSTEMS:   Review of Systems  Constitutional: Negative.  Negative for fever, malaise/fatigue and weight loss.  Respiratory: Negative.  Negative for cough and shortness of breath.   Cardiovascular: Negative.  Negative for chest pain and leg swelling.  Gastrointestinal: Negative.  Negative for abdominal pain.  Genitourinary: Negative.   Musculoskeletal: Negative.   Neurological: Negative.  Negative for weakness.  Psychiatric/Behavioral: Negative.  The patient is not nervous/anxious.     As per HPI. Otherwise, a complete review of systems is negative.  PAST MEDICAL HISTORY: Past Medical History:  Diagnosis Date  . Allergy   . Anemia   . Arthritis   . Atrial fibrillation ([place]) 05/04/2016  . Benign essential hypertension 03/16/2014  . GIB (gastrointestinal bleeding) 05/04/2016  . Glaucoma   . HLD (hyperlipidemia)   . Hypertension   . MDD (major depressive disorder)     PAST SURGICAL HISTORY: Past Surgical History:  Procedure Laterality Date  . COLONIC EMBOLIZATION  2014  . EYE SURGERY  Bilateral    Cataract Extraction  . LEFT HEART CATH AND CORONARY ANGIOGRAPHY Left 03/13/2017   Procedure: Left Heart Cath and Coronary Angiography;  Surgeon: Teodoro Spray, MD;  Location: [facility];  Service: Cardiovascular;  Laterality: Left;    FAMILY HISTORY: Family History  Problem Relation Age of Onset  . Breast cancer Mother   . Breast cancer Sister   . Heart disease Sister   . Heart disease Brother     ADVANCED DIRECTIVES (Y/N):  N  HEALTH MAINTENANCE: Social History  Substance Use Topics  . Smoking status: Never Smoker  . Smokeless tobacco: Never Used  . Alcohol use No     Colonoscopy:  PAP:  Bone density:  Lipid panel:  No Known Allergies  Current Outpatient Prescriptions  Medication Sig Dispense Refill  . amLODipine (NORVASC) 2.5 MG tablet Take 2.5 mg by mouth daily.    Marland Kitchen atorvastatin (LIPITOR) 40 MG tablet Take 40 mg by mouth at bedtime.    . pantoprazole (PROTONIX) 40 MG tablet Take 40 mg by mouth daily.    Marland Kitchen PARoxetine (PAXIL) 10 MG tablet Take 10 mg by mouth daily.    . travoprost, benzalkonium, (TRAVATAN) 0.004 % ophthalmic solution Place 1 drop into both eyes at bedtime.    Marland Kitchen letrozole (FEMARA) 2.5 MG tablet Take 1 tablet (2.5 mg total) by mouth daily. 30 tablet 11   No current facility-administered medications for this visit.     OBJECTIVE: Vitals:   04/05/17 1025  BP: (!) 87/58  Pulse: 80  Resp: 18  Temp: 97.1 F (36.2 C)     Body mass index is 27.64 kg/m.    ECOG FS:0 -  Asymptomatic  General: Well-developed, well-nourished, no acute distress. Eyes: Pink conjunctiva, anicteric sclera. Breasts: Patient again had multiple family members in the room and requested exam be deferred. Lungs: Clear to auscultation bilaterally. Heart: Regular rate and rhythm. No rubs, murmurs, or gallops. Abdomen: Soft, nontender, nondistended. No organomegaly noted, normoactive bowel sounds. Musculoskeletal: No edema, cyanosis, or clubbing. Neuro:  Alert, answering all questions appropriately. Cranial nerves grossly intact. Skin: No rashes or petechiae noted. Psych: Normal affect.    LAB RESULTS:  Lab Results  Component Value Date   NA 139 01/22/2017   K 4.1 01/22/2017   CL 102 01/22/2017   CO2 28 01/22/2017   GLUCOSE 100 (H) 01/22/2017   BUN 25 (H) 01/22/2017   CREATININE 1.45 (H) 01/22/2017   CALCIUM 9.6 01/22/2017   PROT 7.8 01/22/2017   ALBUMIN 3.8 01/22/2017   AST 44 (H) 01/22/2017   ALT 43 01/22/2017   ALKPHOS 51 01/22/2017   BILITOT 0.5 01/22/2017   GFRNONAA 31 (L) 01/22/2017   GFRAA 36 (L) 01/22/2017    Lab Results  Component Value Date   WBC 6.2 01/22/2017   NEUTROABS 4.5 01/22/2017   HGB 13.3 01/22/2017   HCT 41.0 01/22/2017   MCV 87.9 01/22/2017   PLT 205 01/22/2017     STUDIES: Ct Head Wo Contrast  Result Date: 03/12/2017 CLINICAL DATA:  83 y/o  F; headache and hypertension. EXAM: CT HEAD WITHOUT CONTRAST TECHNIQUE: Contiguous axial images were obtained from the base of the skull through the vertex without intravenous contrast. COMPARISON:  02/13/2014 CT of the head. FINDINGS: Brain: No evidence of acute infarction, hemorrhage, hydrocephalus, extra-axial collection or mass lesion/mass effect. Stable nonspecific foci of hypoattenuation in subcortical and periventricular white matter compatible with moderate chronic microvascular ischemic changes. Moderate brain parenchymal volume loss. Vascular: Mild calcific atherosclerosis of cavernous and paraclinoid internal carotid arteries. Skull: Normal. Negative for fracture or focal lesion. Sinuses/Orbits: No acute finding. Other: None. IMPRESSION: 1. No acute intracranial abnormality. 2. Stable moderate chronic microvascular ischemic changes and moderate parenchymal volume loss of the brain. Electronically Signed   By: Kristine Garbe M.D.   On: 03/12/2017 06:35    ASSESSMENT: Clinical stage IIa ER/PR positive, HER-2 negative invasive carcinoma of the  upper outer quadrant of the left breast  PLAN:    1. 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 Given her advanced age, did not recommend chemotherapy or daily XRT. Patient declined surgical intervention. She has agreed to take letrozole which she will take for a minimum of 5 years until April 2023. Return to clinic in 3 months for further evaluation. 2. Post menopausal: Will get a bone mineral density in the next 1-2 weeks.  Approximately 30 minutes was spent in discussion of which greater than 50% was consultation.  Patient expressed understanding and was in agreement with this plan. She also understands that She can call clinic at any time with any questions, concerns, or complaints.   Cancer Staging Primary cancer of upper outer quadrant of left female breast [facility]) Staging form: Breast, AJCC 8th Edition - Clinical stage from 01/11/2017: Stage IIA (cT2, cN1, cM0, G2, ER: Positive, PR: Positive, HER2: Equivocal) - Signed by Lloyd Huger, MD on 01/11/2017   Lloyd Huger, MD   04/07/2017 4:23 PM

## 2017-04-05 ENCOUNTER — Inpatient Hospital Stay: Payer: Medicare HMO | Attending: Oncology | Admitting: Oncology

## 2017-04-05 VITALS — BP 87/58 | HR 80 | Temp 97.1°F | Resp 18 | Wt 151.1 lb

## 2017-04-05 DIAGNOSIS — Z79811 Long term (current) use of aromatase inhibitors: Secondary | ICD-10-CM | POA: Insufficient documentation

## 2017-04-05 DIAGNOSIS — Z803 Family history of malignant neoplasm of breast: Secondary | ICD-10-CM | POA: Diagnosis not present

## 2017-04-05 DIAGNOSIS — Z780 Asymptomatic menopausal state: Secondary | ICD-10-CM | POA: Diagnosis not present

## 2017-04-05 DIAGNOSIS — I4891 Unspecified atrial fibrillation: Secondary | ICD-10-CM | POA: Insufficient documentation

## 2017-04-05 DIAGNOSIS — Z170 Estrogen receptor positive status [ER+]: Secondary | ICD-10-CM | POA: Insufficient documentation

## 2017-04-05 DIAGNOSIS — Z79899 Other long term (current) drug therapy: Secondary | ICD-10-CM | POA: Insufficient documentation

## 2017-04-05 DIAGNOSIS — M129 Arthropathy, unspecified: Secondary | ICD-10-CM | POA: Insufficient documentation

## 2017-04-05 DIAGNOSIS — I10 Essential (primary) hypertension: Secondary | ICD-10-CM | POA: Diagnosis not present

## 2017-04-05 DIAGNOSIS — Z8719 Personal history of other diseases of the digestive system: Secondary | ICD-10-CM | POA: Insufficient documentation

## 2017-04-05 DIAGNOSIS — D649 Anemia, unspecified: Secondary | ICD-10-CM | POA: Insufficient documentation

## 2017-04-05 DIAGNOSIS — C50412 Malignant neoplasm of upper-outer quadrant of left female breast: Secondary | ICD-10-CM | POA: Insufficient documentation

## 2017-04-05 DIAGNOSIS — E785 Hyperlipidemia, unspecified: Secondary | ICD-10-CM | POA: Diagnosis not present

## 2017-04-05 DIAGNOSIS — F329 Major depressive disorder, single episode, unspecified: Secondary | ICD-10-CM | POA: Diagnosis not present

## 2017-04-05 MED ORDER — LETROZOLE 2.5 MG PO TABS: 2.5000 mg | ORAL_TABLET | Freq: Every day | ORAL | 11 refills | Status: AC

## 2017-04-05 NOTE — Progress Notes (Signed)
  Oncology Nurse Navigator Documentation  Navigator Location: [facility] (04/05/17 1300)   )Navigator Encounter Type: [place] Follow-up (04/05/17 1300) Telephone: Clinic/[facility] Follow-up (04/05/17 1300)                     Treatment Phase: Follow-up (04/05/17 1300) Barriers/Navigation Needs: Education;Family concerns (04/05/17 1300) Education: Preparing for Upcoming Surgery/ Treatment;Understanding Cancer/ Treatment Options (04/05/17 1300)                        Time Spent with Patient: 60 (04/05/17 1300)   Supported patient at follow-up visit with Dr. Grayland Ormond.  Patien has declined surgery at this time, and will start antihormonal treatment.  Offered assistance through [employer] if cannot afford medication. Scheduled for Bone Density in [place] 04/20/17.  Son Jiles Prows present for visit , and both patient and son pleased with treatment plan.

## 2017-04-09 ENCOUNTER — Ambulatory Visit
Admission: RE | Admit: 2017-04-09 | Discharge: 2017-04-09 | Disposition: A | Discharge status: Home / Self Care | Payer: Medicare HMO | Source: Ambulatory Visit | Attending: Oncology | Admitting: Oncology

## 2017-04-09 DIAGNOSIS — Z780 Asymptomatic menopausal state: Secondary | ICD-10-CM | POA: Diagnosis present

## 2017-04-09 DIAGNOSIS — C50412 Malignant neoplasm of upper-outer quadrant of left female breast: Secondary | ICD-10-CM

## 2017-09-09 ENCOUNTER — Observation Stay
Admission: EM | Admit: 2017-09-09 | Discharge: 2017-09-10 | Disposition: A | Discharge status: Home / Self Care | Payer: Medicare HMO | Attending: Internal Medicine | Admitting: Internal Medicine

## 2017-09-09 ENCOUNTER — Emergency Department: Payer: Medicare HMO

## 2017-09-09 ENCOUNTER — Observation Stay: Payer: Medicare HMO

## 2017-09-09 DIAGNOSIS — R1900 Intra-abdominal and pelvic swelling, mass and lump, unspecified site: Secondary | ICD-10-CM

## 2017-09-09 DIAGNOSIS — E785 Hyperlipidemia, unspecified: Secondary | ICD-10-CM | POA: Diagnosis not present

## 2017-09-09 DIAGNOSIS — I714 Abdominal aortic aneurysm, without rupture: Secondary | ICD-10-CM | POA: Insufficient documentation

## 2017-09-09 DIAGNOSIS — Z8249 Family history of ischemic heart disease and other diseases of the circulatory system: Secondary | ICD-10-CM | POA: Diagnosis not present

## 2017-09-09 DIAGNOSIS — C50412 Malignant neoplasm of upper-outer quadrant of left female breast: Secondary | ICD-10-CM | POA: Diagnosis not present

## 2017-09-09 DIAGNOSIS — Z803 Family history of malignant neoplasm of breast: Secondary | ICD-10-CM | POA: Insufficient documentation

## 2017-09-09 DIAGNOSIS — I10 Essential (primary) hypertension: Principal | ICD-10-CM | POA: Insufficient documentation

## 2017-09-09 DIAGNOSIS — I4891 Unspecified atrial fibrillation: Secondary | ICD-10-CM | POA: Insufficient documentation

## 2017-09-09 DIAGNOSIS — I7389 Other specified peripheral vascular diseases: Secondary | ICD-10-CM | POA: Insufficient documentation

## 2017-09-09 DIAGNOSIS — Z23 Encounter for immunization: Secondary | ICD-10-CM | POA: Diagnosis not present

## 2017-09-09 DIAGNOSIS — I160 Hypertensive urgency: Secondary | ICD-10-CM

## 2017-09-09 DIAGNOSIS — F329 Major depressive disorder, single episode, unspecified: Secondary | ICD-10-CM | POA: Insufficient documentation

## 2017-09-09 DIAGNOSIS — E236 Other disorders of pituitary gland: Secondary | ICD-10-CM

## 2017-09-09 DIAGNOSIS — Z79811 Long term (current) use of aromatase inhibitors: Secondary | ICD-10-CM | POA: Insufficient documentation

## 2017-09-09 DIAGNOSIS — Z9889 Other specified postprocedural states: Secondary | ICD-10-CM | POA: Insufficient documentation

## 2017-09-09 DIAGNOSIS — G9389 Other specified disorders of brain: Secondary | ICD-10-CM | POA: Insufficient documentation

## 2017-09-09 DIAGNOSIS — H409 Unspecified glaucoma: Secondary | ICD-10-CM | POA: Insufficient documentation

## 2017-09-09 DIAGNOSIS — R42 Dizziness and giddiness: Secondary | ICD-10-CM

## 2017-09-09 DIAGNOSIS — N390 Urinary tract infection, site not specified: Secondary | ICD-10-CM | POA: Diagnosis not present

## 2017-09-09 DIAGNOSIS — Z79899 Other long term (current) drug therapy: Secondary | ICD-10-CM | POA: Insufficient documentation

## 2017-09-09 LAB — URINALYSIS, COMPLETE (UACMP) WITH MICROSCOPIC
Bilirubin Urine: NEGATIVE
GLUCOSE, UA: NEGATIVE mg/dL
HGB URINE DIPSTICK: NEGATIVE
Ketones, ur: 5 mg/dL — AB
Nitrite: NEGATIVE
PH: 5 (ref 5.0–8.0)
PROTEIN: NEGATIVE mg/dL
SPECIFIC GRAVITY, URINE: 1.012 (ref 1.005–1.030)

## 2017-09-09 LAB — BASIC METABOLIC PANEL
Anion gap: 9 (ref 5–15)
BUN: 23 mg/dL — AB (ref 6–20)
CHLORIDE: 102 mmol/L (ref 101–111)
CO2: 26 mmol/L (ref 22–32)
Calcium: 9.8 mg/dL (ref 8.9–10.3)
Creatinine, Ser: 1.13 mg/dL — ABNORMAL HIGH (ref 0.44–1.00)
GFR calc Af Amer: 49 mL/min — ABNORMAL LOW (ref >60)
GFR calc non Af Amer: 42 mL/min — ABNORMAL LOW (ref >60)
Glucose, Bld: 120 mg/dL — ABNORMAL HIGH (ref 65–99)
POTASSIUM: 3.9 mmol/L (ref 3.5–5.1)
SODIUM: 137 mmol/L (ref 135–145)

## 2017-09-09 LAB — CBC WITH DIFFERENTIAL/PLATELET
Basophils Absolute: 0 10*3/uL (ref 0–0.1)
Basophils Relative: 0 %
Eosinophils Absolute: 0 10*3/uL (ref 0–0.7)
Eosinophils Relative: 0 %
HCT: 39.6 % (ref 35.0–47.0)
HEMOGLOBIN: 13.2 g/dL (ref 12.0–16.0)
LYMPHS ABS: 0.5 10*3/uL — AB (ref 1.0–3.6)
LYMPHS PCT: 6 %
MCH: 29.8 pg (ref 26.0–34.0)
MCHC: 33.3 g/dL (ref 32.0–36.0)
MCV: 89.4 fL (ref 80.0–100.0)
Monocytes Absolute: 0.4 10*3/uL (ref 0.2–0.9)
Monocytes Relative: 5 %
NEUTROS ABS: 7.2 10*3/uL — AB (ref 1.4–6.5)
NEUTROS PCT: 89 %
Platelets: 209 10*3/uL (ref 150–440)
RBC: 4.43 MIL/uL (ref 3.80–5.20)
RDW: 14.4 % (ref 11.5–14.5)
WBC: 8.2 10*3/uL (ref 3.6–11.0)

## 2017-09-09 LAB — TSH: TSH: 0.674 u[IU]/mL (ref 0.350–4.500)

## 2017-09-09 LAB — TROPONIN I

## 2017-09-09 LAB — MAGNESIUM: Magnesium: 1.8 mg/dL (ref 1.7–2.4)

## 2017-09-09 MED ORDER — DOCUSATE SODIUM 100 MG PO CAPS
100.0000 mg | ORAL_CAPSULE | Freq: Two times a day (BID) | ORAL | Status: DC
  Administered 2017-09-10: 08:00:00 100 mg via ORAL
  Filled 2017-09-09: qty 1

## 2017-09-09 MED ORDER — AMLODIPINE BESYLATE 5 MG PO TABS
2.5000 mg | ORAL_TABLET | Freq: Every day | ORAL | Status: DC
  Administered 2017-09-10: 08:00:00 2.5 mg via ORAL
  Filled 2017-09-09: qty 1

## 2017-09-09 MED ORDER — AMLODIPINE BESYLATE 5 MG PO TABS
5.0000 mg | ORAL_TABLET | Freq: Once | ORAL | Status: AC
  Administered 2017-09-09: 5 mg via ORAL
  Filled 2017-09-09: qty 1

## 2017-09-09 MED ORDER — PANTOPRAZOLE SODIUM 40 MG PO TBEC
40.0000 mg | DELAYED_RELEASE_TABLET | Freq: Every day | ORAL | Status: DC
  Administered 2017-09-10: 40 mg via ORAL
  Filled 2017-09-09: qty 1

## 2017-09-09 MED ORDER — TRAVOPROST 0.004 % OP SOLN: 1.0000 [drp] | Freq: Every day | OPHTHALMIC | Status: DC

## 2017-09-09 MED ORDER — SODIUM CHLORIDE 0.9 % IV SOLN
INTRAVENOUS | Status: DC
  Administered 2017-09-09 – 2017-09-10 (×2): via INTRAVENOUS

## 2017-09-09 MED ORDER — ONDANSETRON HCL 4 MG/2ML IJ SOLN: 4.0000 mg | Freq: Four times a day (QID) | INTRAMUSCULAR | Status: DC | PRN

## 2017-09-09 MED ORDER — ATORVASTATIN CALCIUM 20 MG PO TABS: 40.0000 mg | ORAL_TABLET | Freq: Every day | ORAL | Status: DC

## 2017-09-09 MED ORDER — SODIUM CHLORIDE 0.9 % IV BOLUS (SEPSIS)
500.0000 mL | Freq: Once | INTRAVENOUS | Status: AC
  Administered 2017-09-09: 500 mL via INTRAVENOUS

## 2017-09-09 MED ORDER — ISOSORBIDE MONONITRATE ER 30 MG PO TB24: 15.0000 mg | ORAL_TABLET | Freq: Every day | ORAL | Status: DC

## 2017-09-09 MED ORDER — ACETAMINOPHEN 325 MG PO TABS: 650.0000 mg | ORAL_TABLET | Freq: Four times a day (QID) | ORAL | Status: DC | PRN

## 2017-09-09 MED ORDER — INFLUENZA VAC SPLIT HIGH-DOSE 0.5 ML IM SUSY
0.5000 mL | PREFILLED_SYRINGE | INTRAMUSCULAR | Status: AC
  Administered 2017-09-10: 11:00:00 0.5 mL via INTRAMUSCULAR
  Filled 2017-09-09 (×2): qty 0.5

## 2017-09-09 MED ORDER — AMLODIPINE BESYLATE 5 MG PO TABS: 2.5000 mg | ORAL_TABLET | Freq: Once | ORAL | Status: DC

## 2017-09-09 MED ORDER — ENOXAPARIN SODIUM 40 MG/0.4ML ~~LOC~~ SOLN: 40.0000 mg | SUBCUTANEOUS | Status: DC

## 2017-09-09 MED ORDER — AMLODIPINE BESYLATE 2.5 MG PO TABS: 5.0000 mg | ORAL_TABLET | Freq: Every day | ORAL | 0 refills | Status: DC

## 2017-09-09 MED ORDER — MECLIZINE HCL 25 MG PO TABS
25.0000 mg | ORAL_TABLET | Freq: Once | ORAL | Status: AC
  Administered 2017-09-09: 25 mg via ORAL
  Filled 2017-09-09: qty 1

## 2017-09-09 MED ORDER — LETROZOLE 2.5 MG PO TABS
2.5000 mg | ORAL_TABLET | Freq: Every day | ORAL | Status: DC
  Administered 2017-09-10: 2.5 mg via ORAL
  Filled 2017-09-09: qty 1

## 2017-09-09 MED ORDER — ACETAMINOPHEN 650 MG RE SUPP: 650.0000 mg | Freq: Four times a day (QID) | RECTAL | Status: DC | PRN

## 2017-09-09 MED ORDER — HYDRALAZINE HCL 20 MG/ML IJ SOLN: 10.0000 mg | Freq: Once | INTRAMUSCULAR | Status: DC

## 2017-09-09 MED ORDER — PAROXETINE HCL 10 MG PO TABS
10.0000 mg | ORAL_TABLET | Freq: Every day | ORAL | Status: DC
  Administered 2017-09-10: 08:00:00 10 mg via ORAL
  Filled 2017-09-09: qty 1

## 2017-09-09 MED ORDER — ISOSORBIDE MONONITRATE ER 30 MG PO TB24
15.0000 mg | ORAL_TABLET | Freq: Every day | ORAL | Status: DC
  Administered 2017-09-09: 15 mg via ORAL
  Filled 2017-09-09: qty 1

## 2017-09-09 MED ORDER — ONDANSETRON HCL 4 MG PO TABS: 4.0000 mg | ORAL_TABLET | Freq: Four times a day (QID) | ORAL | Status: DC | PRN

## 2017-09-09 MED ORDER — LATANOPROST 0.005 % OP SOLN
1.0000 [drp] | Freq: Every day | OPHTHALMIC | Status: DC
  Administered 2017-09-09: 23:00:00 1 [drp] via OPHTHALMIC
  Filled 2017-09-09: qty 2.5

## 2017-09-09 NOTE — ED Provider Notes (Signed)
[facility] Emergency Department Provider Note  ____________________________________________  Time seen: Approximately 1:37 PM  I have reviewed the triage vital signs and the nursing notes.   HISTORY  Chief Complaint Hypertension (took morning meds, no improvement)   HPI Terry Hendrix is a 83 y.o. female with a history of atrial fibrillation, hypertension, hyperlipidemia, GI bleed, anemia, breast cancer on PO letrozole who presents for evaluation of vertigo and elevated blood pressure. Patient reports that she woke up at 7 AM this morning and when she tried to get up from bed and she started having room spinning sensation. She felt nauseated and was unable to walk without losing her balance. She denies near-syncope or syncope, chest pain, palpitations, shortness of breath. She called her son and when he arrived patient was very vertiginous but had no facial droop, slurred speech, difficulty finding words, or unilateral weakness or numbness. No personal or family history of stroke, no history of smoking. Patient denies headache. She is not on blood thinners. She denies any fall or head trauma. She denies nausea, vomiting, diarrhea, dysuria, abdominal pain, chest pain, shortness of breath. She denies diplopia, dysarthria, dysphasia.  Past Medical History:  Diagnosis Date  . Allergy   . Anemia   . Arthritis   . Atrial fibrillation ([place]) 05/04/2016  . Benign essential hypertension 03/16/2014  . GIB (gastrointestinal bleeding) 05/04/2016  . Glaucoma   . HLD (hyperlipidemia)   . Hypertension   . MDD (major depressive disorder)     Patient Active Problem List   Diagnosis Date Noted  . Aneurysm of thoracic aorta ([place]) 02/14/2017  . AAA (abdominal aortic aneurysm) without rupture ([place]) 02/14/2017  . SOB (shortness of breath) 02/11/2017  . Anemia   . MDD (major depressive disorder)   . Glaucoma   . HLD (hyperlipidemia)   . Hypertension   . Allergy   .  Arthritis   . Primary cancer of upper outer quadrant of left female breast ([place]) 01/11/2017  . GIB (gastrointestinal bleeding) 05/04/2016  . Atrial fibrillation ([place]) 05/04/2016  . Right shoulder pain 04/18/2016  . Benign essential hypertension 03/16/2014    Past Surgical History:  Procedure Laterality Date  . COLONIC EMBOLIZATION  2014  . EYE SURGERY Bilateral    Cataract Extraction  . LEFT HEART CATH AND CORONARY ANGIOGRAPHY Left 03/13/2017   Procedure: Left Heart Cath and Coronary Angiography;  Surgeon: Teodoro Spray, MD;  Location: [facility];  Service: Cardiovascular;  Laterality: Left;    Prior to Admission medications   Medication Sig Start Date End Date Taking? Authorizing Provider  atorvastatin (LIPITOR) 40 MG tablet Take 40 mg by mouth at bedtime.   Yes [provider]  letrozole (FEMARA) 2.5 MG tablet Take 1 tablet (2.5 mg total) by mouth daily. 04/05/17  Yes Lloyd Huger, MD  pantoprazole (PROTONIX) 40 MG tablet Take 40 mg by mouth daily.   Yes [provider]  PARoxetine (PAXIL) 10 MG tablet Take 10 mg by mouth daily.   Yes [provider]  travoprost, benzalkonium, (TRAVATAN) 0.004 % ophthalmic solution Place 1 drop into both eyes at bedtime.   Yes [provider]  amLODipine (NORVASC) 2.5 MG tablet Take 2 tablets (5 mg total) by mouth daily. 09/09/17   Rudene Re, MD    Allergies Patient has no known allergies.  Family History  Problem Relation Age of Onset  . Breast cancer Mother   . Breast cancer Sister   . Heart disease  Sister   . Heart disease Brother     Social History Social History  Substance Use Topics  . Smoking status: Never Smoker  . Smokeless tobacco: Never Used  . Alcohol use No    Review of Systems  Constitutional: Negative for fever. Eyes: Negative for visual changes. ENT: Negative for sore throat. Neck: No neck pain  Cardiovascular: Negative for chest pain. Respiratory:  Negative for shortness of breath. Gastrointestinal: Negative for abdominal pain, vomiting or diarrhea. Genitourinary: Negative for dysuria. Musculoskeletal: Negative for back pain. Skin: Negative for rash. Neurological: Negative for headaches, weakness or numbness. + vertigo Psych: No SI or HI  ____________________________________________   PHYSICAL EXAM:  VITAL SIGNS: ED Triage Vitals  Enc Vitals Group     BP --      Pulse Rate 09/09/17 1237 86     Resp --      Temp 09/09/17 1237 (!) 97.4 F (36.3 C)     Temp Source 09/09/17 1237 Oral     SpO2 --      Weight 09/09/17 1239 155 lb (70.3 kg)     Height 09/09/17 1239 5\' 4"  (1.626 m)     Head Circumference --      Peak Flow --      Pain Score --      Pain Loc --      Pain Edu? --      Excl. in [place]? --     Constitutional: Alert and oriented. Well appearing and in no apparent distress. HEENT:      Head: Normocephalic and atraumatic.         Eyes: Conjunctivae are normal. Sclera is non-icteric.       Mouth/Throat: Mucous membranes are moist.       Neck: Supple with no signs of meningismus. Cardiovascular: Regular rate and rhythm. No murmurs, gallops, or rubs. 2+ symmetrical distal pulses are present in all extremities. No JVD. Respiratory: Normal respiratory effort. Lungs are clear to auscultation bilaterally. No wheezes, crackles, or rhonchi.  Gastrointestinal: Soft, non tender, and non distended with positive bowel sounds. No rebound or guarding. Musculoskeletal: Nontender with normal range of motion in all extremities. No edema, cyanosis, or erythema of extremities. Neurologic: Normal speech and language. A & O x3, PERRL, EOMI, no nystagmus, CN II-XII intact, motor testing reveals good tone and bulk throughout. There is no evidence of pronator drift or dysmetria. Muscle strength is 5/5 throughout. Sensory examination is intact. Gait deferred Skin: Skin is warm, dry and intact. No rash noted. Psychiatric: Mood and affect are  normal. Speech and behavior are normal.  ____________________________________________   LABS (all labs ordered are listed, but only abnormal results are displayed)  Labs Reviewed  CBC WITH DIFFERENTIAL/PLATELET - Abnormal; Notable for the following:       Result Value   Neutro Abs 7.2 (*)    Lymphs Abs 0.5 (*)    All other components within normal limits  BASIC METABOLIC PANEL - Abnormal; Notable for the following:    Glucose, Bld 120 (*)    BUN 23 (*)    Creatinine, Ser 1.13 (*)    GFR calc non Af Amer 42 (*)    GFR calc Af Amer 49 (*)    All other components within normal limits  TROPONIN I  MAGNESIUM  URINALYSIS, COMPLETE (UACMP) WITH MICROSCOPIC   ____________________________________________  EKG  ED ECG REPORT I, Rudene Re, the attending physician, personally viewed and interpreted this ECG.  Normal sinus rhythm, rate of  83, right bundle branch block, prolonged QTC, normal axis, no ST elevations or depressions. bundle-branch and prolonged QT are new when compared to prior from April 2018 ____________________________________________  RADIOLOGY  Head CT: 1. No acute finding or change from prior. 2. Moderate atrophy and chronic small vessel ischemia.  MRI brain: . No acute abnormality. 2. Moderate findings of chronic ischemic microangiopathy. 3. Mildly enlarged pituitary gland for age. Correlation with laboratory values may be helpful to assess for possible underlying adenoma. No mass effect on adjacent structures. ____________________________________________   PROCEDURES  Procedure(s) performed: None Procedures Critical Care performed:  None ____________________________________________   INITIAL IMPRESSION / ASSESSMENT AND PLAN / ED COURSE   83 y.o. female with a history of atrial fibrillation, hypertension, hyperlipidemia, GI bleed, anemia, breast cancer on PO letrozole who presents for evaluation of vertigo and elevated blood pressure since this  am. Patient is neurologically intact however has several comorbidities increasing her risk factor for stroke such affective cancer, A. fib, hypertension, and hyperlipidemia. We'll do head CT and if that's negative sent for an MRI to rule out ischemic stroke. Patient has noted chest pain, shortness of breath, or palpitations at this time. Will monitor on telemetry. Will check labs and electrolytes.Will hold off lowering BP in case patient has a acute stroke.    _________________________ 4:36 PM on 09/09/2017 -----------------------------------------  MRI with no evidence of an acute stroke. Patient's BP remains elevated. We'll increase her dose of amlodipine and monitor for blood pressure improvement. Patient no longer feeling vertiginous after receiving meclizine. Plan to discharge home on a new dose of amlodipine at 5 mg daily and close follow-up with primary care doctor. Discussed return precautions with patient. Discussed with patient and son the results of enlarged pituitary and recommended outpatient evaluation.  Pertinent labs & imaging results that were available during my care of the patient were reviewed by me and considered in my medical decision making (see chart for details).    ____________________________________________   FINAL CLINICAL IMPRESSION(S) / ED DIAGNOSES  Final diagnoses:  Hypertensive urgency  Vertigo  Enlarged pituitary gland ([place])      NEW MEDICATIONS STARTED DURING THIS VISIT:  Current Discharge Medication List       Note:  This document was prepared using Dragon voice recognition software and may include unintentional dictation errors.    Rudene Re, MD 09/09/17 [phone]

## 2017-09-09 NOTE — ED Notes (Signed)
Ultrasound to the bedside to perform.

## 2017-09-09 NOTE — ED Notes (Signed)
Patient reports feeling very dizzy when she stands.  Patient seems weak, standing, wanting to sit back down.

## 2017-09-09 NOTE — ED Triage Notes (Signed)
Patient lives at home, brought in by EMS for dizziness and hypertension. Patient took all daily medications this am at about 0800.

## 2017-09-09 NOTE — ED Notes (Signed)
Patient transported to room 102 

## 2017-09-09 NOTE — ED Notes (Signed)
Pt assisted to BR; required 1-2 person assist, pt demonstrated slow and steady gait. Pt reports "some dizziness" upon return to stretcher. Dr Burlene Arnt made aware.

## 2017-09-09 NOTE — ED Notes (Signed)
In to update pt. Fluids infusing. Family at bedside. Family wants pt admitted for safety due to unstable pressure.

## 2017-09-09 NOTE — H&P (Signed)
Terry Hendrix is an 83 y.o. female.   Chief Complaint: Dizziness HPI: The patient with past medical history of hypertension, breast cancer, atrial fibrillation, anemia and depression presents to the emergency department complaining of dizziness. The patient states that she awoke this morning feeling as if her head were swimming. She slid off of her bed down to her hands and knees and required help to stand and return to a seated position. She denied chest pain, shortness of breath, nausea or vomiting. Her blood pressure was found to be approximately 240/130.  A family friend who is a paramedic rechecked her pressure and found it to be nearly the same. The patient reports taking all of her antihypertensive medication and feeling well until this morning, although she admits that she has not had a great appetite lately. Upon arrival to the emergency department blood pressure was 208/130. She was given amlodipine 5 mg which then dropped her systolic pressure 828. The ED approached the hospitalist service for admission. Fluid bolus was recommended as well as ambulation to assess functional status. The patients blood pressure increased again to hypertensive range and she still required support to stand upright. Thus the hospitalist service agreed to admit the patient for further management.  Past Medical History:  Diagnosis Date  . Allergy   . Anemia   . Arthritis   . Atrial fibrillation ([place]) 05/04/2016  . Benign essential hypertension 03/16/2014  . GIB (gastrointestinal bleeding) 05/04/2016  . Glaucoma   . HLD (hyperlipidemia)   . Hypertension   . MDD (major depressive disorder)     Past Surgical History:  Procedure Laterality Date  . COLONIC EMBOLIZATION  2014  . EYE SURGERY Bilateral    Cataract Extraction  . LEFT HEART CATH AND CORONARY ANGIOGRAPHY Left 03/13/2017   Procedure: Left Heart Cath and Coronary Angiography;  Surgeon: Teodoro Spray, MD;  Location: [facility];  Service:  Cardiovascular;  Laterality: Left;    Family History  Problem Relation Age of Onset  . Breast cancer Mother   . Breast cancer Sister   . Heart disease Sister   . Heart disease Brother    Social History:  reports that she has never smoked. She has never used smokeless tobacco. She reports that she does not drink alcohol or use drugs.  Allergies: No Known Allergies  Medications Prior to Admission  Medication Sig Dispense Refill  . atorvastatin (LIPITOR) 40 MG tablet Take 40 mg by mouth at bedtime.    Marland Kitchen letrozole (FEMARA) 2.5 MG tablet Take 1 tablet (2.5 mg total) by mouth daily. 30 tablet 11  . pantoprazole (PROTONIX) 40 MG tablet Take 40 mg by mouth daily.    Marland Kitchen PARoxetine (PAXIL) 10 MG tablet Take 10 mg by mouth daily.    . travoprost, benzalkonium, (TRAVATAN) 0.004 % ophthalmic solution Place 1 drop into both eyes at bedtime.      Results for orders placed or performed during the hospital encounter of 09/09/17 (from the past 48 hour(s))  CBC with Differential/Platelet     Status: Abnormal   Collection Time: 09/09/17  1:03 PM  Result Value Ref Range   WBC 8.2 3.6 - 11.0 K/uL   RBC 4.43 3.80 - 5.20 MIL/uL   Hemoglobin 13.2 12.0 - 16.0 g/dL   HCT 39.6 35.0 - 47.0 %   MCV 89.4 80.0 - 100.0 fL   MCH 29.8 26.0 - 34.0 pg   MCHC 33.3 32.0 - 36.0 g/dL   RDW 14.4 11.5 -  14.5 %   Platelets 209 150 - 440 K/uL   Neutrophils Relative % 89 %   Neutro Abs 7.2 (H) 1.4 - 6.5 K/uL   Lymphocytes Relative 6 %   Lymphs Abs 0.5 (L) 1.0 - 3.6 K/uL   Monocytes Relative 5 %   Monocytes Absolute 0.4 0.2 - 0.9 K/uL   Eosinophils Relative 0 %   Eosinophils Absolute 0.0 0 - 0.7 K/uL   Basophils Relative 0 %   Basophils Absolute 0.0 0 - 0.1 K/uL  Basic metabolic panel     Status: Abnormal   Collection Time: 09/09/17  1:03 PM  Result Value Ref Range   Sodium 137 135 - 145 mmol/L   Potassium 3.9 3.5 - 5.1 mmol/L   Chloride 102 101 - 111 mmol/L   CO2 26 22 - 32 mmol/L   Glucose, Bld 120 (H) 65 -  99 mg/dL   BUN 23 (H) 6 - 20 mg/dL   Creatinine, Ser 1.13 (H) 0.44 - 1.00 mg/dL   Calcium 9.8 8.9 - 10.3 mg/dL   GFR calc non Af Amer 42 (L) >60 mL/min   GFR calc Af Amer 49 (L) >60 mL/min    Comment: (NOTE) The eGFR has been calculated using the CKD EPI equation. This calculation has not been validated in all clinical situations. eGFR's persistently <60 mL/min signify possible Chronic Kidney Disease.    Anion gap 9 5 - 15  Troponin I     Status: None   Collection Time: 09/09/17  1:03 PM  Result Value Ref Range   Troponin I <0.03 <0.03 ng/mL  Magnesium     Status: None   Collection Time: 09/09/17  1:03 PM  Result Value Ref Range   Magnesium 1.8 1.7 - 2.4 mg/dL  Urinalysis, Complete w Microscopic     Status: Abnormal   Collection Time: 09/09/17  7:59 PM  Result Value Ref Range   Color, Urine YELLOW (A) YELLOW   APPearance HAZY (A) CLEAR   Specific Gravity, Urine 1.012 1.005 - 1.030   pH 5.0 5.0 - 8.0   Glucose, UA NEGATIVE NEGATIVE mg/dL   Hgb urine dipstick NEGATIVE NEGATIVE   Bilirubin Urine NEGATIVE NEGATIVE   Ketones, ur 5 (A) NEGATIVE mg/dL   Protein, ur NEGATIVE NEGATIVE mg/dL   Nitrite NEGATIVE NEGATIVE   Leukocytes, UA MODERATE (A) NEGATIVE   RBC / HPF 0-5 0 - 5 RBC/hpf   WBC, UA TOO NUMEROUS TO COUNT 0 - 5 WBC/hpf   Bacteria, UA MANY (A) NONE SEEN   Squamous Epithelial / LPF 0-5 (A) NONE SEEN   Mucus PRESENT    Hyaline Casts, UA PRESENT    Non Squamous Epithelial 0-5 (A) NONE SEEN   Ct Head Wo Contrast  Result Date: 09/09/2017 CLINICAL DATA:  Persistent vertigo, central. EXAM: CT HEAD WITHOUT CONTRAST TECHNIQUE: Contiguous axial images were obtained from the base of the skull through the vertex without intravenous contrast. COMPARISON:  03/12/2017 FINDINGS: Brain: No evidence of acute infarction, hemorrhage, hydrocephalus, extra-axial collection or mass lesion/mass effect. Moderate atrophy with ventriculomegaly. Moderate chronic microvascular ischemic type gliosis  in the cerebral white matter. Vascular: Arterial calcification.  No hyperdense vessel. Skull: No acute or aggressive finding. Sinuses/Orbits: Negative IMPRESSION: 1. No acute finding or change from prior. 2. Moderate atrophy and chronic small vessel ischemia. Electronically Signed   By: Monte Fantasia M.D.   On: 09/09/2017 13:43   Mr Brain Wo Contrast  Result Date: 09/09/2017 CLINICAL DATA:  Central vertigo.  Hypertension.  EXAM: MRI HEAD WITHOUT CONTRAST TECHNIQUE: Multiplanar, multiecho pulse sequences of the brain and surrounding structures were obtained without intravenous contrast. COMPARISON:  Head CT 09/09/2017 FINDINGS: Brain: Pituitary gland is enlarged for age, measuring 12 mm in craniocaudal dimension. There is no focal diffusion restriction to indicate acute infarct. There is beginning confluent hyperintense T2-weighted signal within the brainstem and the periventricular and deep white matter, most often seen in the setting of chronic microvascular ischemia. Single focus chronic microhemorrhage in the posterior left temporal lobe. Brain volume is normal for age without lobar predominant atrophy. The dura is normal and there is no extra-axial collection. Vascular: Major intracranial arterial and venous sinus flow voids are preserved. Skull and upper cervical spine: The visualized skull base, calvarium, upper cervical spine and extracranial soft tissues are normal. Sinuses/Orbits: No fluid levels or advanced mucosal thickening. No mastoid or middle ear effusion. Normal orbits. IMPRESSION: 1. No acute abnormality. 2. Moderate findings of chronic ischemic microangiopathy. 3. Mildly enlarged pituitary gland for age. Correlation with laboratory values may be helpful to assess for possible underlying adenoma. No mass effect on adjacent structures. Electronically Signed   By: Ulyses Jarred M.D.   On: 09/09/2017 16:01   [place] Retroperitoneal Comp  Result Date: 09/09/2017 CLINICAL DATA:  Pulsatile abdominal  mass. EXAM: ULTRASOUND RETROPERITONEAL COMPLETE TECHNIQUE: Ultrasound examination of the abdominal aorta was performed to evaluate for abdominal aortic aneurysm. The common iliac arteries, IVC, and kidneys were also evaluated. COMPARISON:  None. FINDINGS: Abdominal Aorta Atherosclerotic plaque noted. Fusiform abdominal aortic aneurysm is seen measuring 4.0 cm in maximum diameter. Maximum AP Diameter:  4.0 cm Maximum TRV Diameter: 4.0 cm Right Common Iliac Artery No aneurysm identified. Left Common Iliac Artery No aneurysm identified. IVC No abnormality visualized. Right Kidney Length: 9.8 cm Echogenicity within normal limits. No mass or hydronephrosis visualized. Left Kidney Length: 9.3 cm Echogenicity within normal limits. No mass or hydronephrosis visualized. IMPRESSION: 4.0 cm fusiform abdominal aortic aneurysm. Recommend followup by [place] in 1 year. This recommendation follows ACR consensus guidelines: White Paper of the ACR Incidental Findings Committee II on Vascular Findings. J Am Coll Radiol 2013; 10:789-794. Electronically Signed   By: Earle Gell M.D.   On: 09/09/2017 22:03    Review of Systems  Constitutional: Negative for chills and fever.  HENT: Negative for sore throat and tinnitus.   Eyes: Negative for blurred vision and redness.  Respiratory: Negative for cough and shortness of breath.   Cardiovascular: Negative for chest pain, palpitations, orthopnea and PND.  Gastrointestinal: Negative for abdominal pain, diarrhea, nausea and vomiting.  Genitourinary: Negative for dysuria, frequency and urgency.  Musculoskeletal: Negative for joint pain and myalgias.  Skin: Negative for rash.       No lesions  Neurological: Positive for dizziness. Negative for speech change, focal weakness and weakness.  Endo/Heme/Allergies: Does not bruise/bleed easily.       No temperature intolerance  Psychiatric/Behavioral: Negative for depression and suicidal ideas.    Blood pressure (!) 158/92, pulse 69,  temperature 97.7 F (36.5 C), temperature source Oral, resp. rate 17, height 5' 4"  (1.626 m), weight 70.3 kg (155 lb), SpO2 97 %. Physical Exam  Vitals reviewed. Constitutional: She is oriented to person, place, and time. She appears well-developed and well-nourished. No distress.  HENT:  Head: Normocephalic and atraumatic.  Mouth/Throat: Oropharynx is clear and moist.  Eyes: Pupils are equal, round, and reactive to light. Conjunctivae and EOM are normal. No scleral icterus.  Neck: Normal range of motion. Neck supple.  No JVD present. No tracheal deviation present. No thyromegaly present.  Cardiovascular: Normal rate, regular rhythm and normal heart sounds.  Exam reveals no gallop and no friction rub.   No murmur heard. Respiratory: Effort normal and breath sounds normal.  GI: Soft. Bowel sounds are normal. She exhibits pulsatile midline mass. She exhibits no distension. There is no hepatosplenomegaly. There is no tenderness.  Genitourinary:  Genitourinary Comments: Deferred  Musculoskeletal: Normal range of motion. She exhibits no edema.  Lymphadenopathy:    She has no cervical adenopathy.  Neurological: She is alert and oriented to person, place, and time. No cranial nerve deficit. She exhibits normal muscle tone.  Skin: Skin is warm and dry. No rash noted. No erythema.  Psychiatric: She has a normal mood and affect. Her behavior is normal. Judgment and thought content normal.     Assessment/Plan This is an 83 year old female admitted for uncontrolled hypertension. 1. Hypertension: The patient's son reports that she has had episodes like this in the past. Home medication list includes amlodipine 2.5 mg which is the latest dose titration of her antihypertensive medication due to episodes of accelerated hypertension with alternating hypotension due greater doses of amlodipine. Initially I had held the patient's antihypertensives but I will restart amlodipine at her home dose as well as  Imdur. Continue to monitor blood pressure and heart rate. No neurologic deficits. 2. Dizziness: The patient reports that she feels as if she is spinning although notably she also has some vertiginous symptoms. Monitor for improvement as blood pressure becomes more controlled. Consult neurology if no progress after blood pressure is better. 3. AAA: Physical exam revealed pulsatile abdominal mass. This is a new finding. I ordered an ultrasound of the patient's abdomen (instead of CTA due to her borderline kidney function) to assess aortic with which was found to be 4 cm. Follow-up in one year. Manage blood pressure. 4. Breast cancer: The patient has declined surgical intervention, radiation or chemotherapy. Continue Femara. 5. Hyperlipidemia: Continue statin therapy 6. Depression: Continue paroxetine 7. DVT prophylaxis: Lovenox 8. GI prophylaxis: Pantoprazole per home regimen The patient is a full code. Time spent on admission orders and patient care possibly 45 minutes  Harrie Foreman, MD 09/09/2017, 10:10 PM

## 2017-09-09 NOTE — ED Notes (Signed)
Dr. Alfred Levins in room to update patient and family.  Will continue to monitor.

## 2017-09-10 MED ORDER — CEPHALEXIN 500 MG PO CAPS: 500.0000 mg | ORAL_CAPSULE | Freq: Two times a day (BID) | ORAL | 0 refills | Status: DC

## 2017-09-10 MED ORDER — ISOSORBIDE MONONITRATE ER 30 MG PO TB24: 15.0000 mg | ORAL_TABLET | Freq: Every day | ORAL | Status: DC

## 2017-09-10 MED ORDER — AMLODIPINE BESYLATE 5 MG PO TABS: 2.5000 mg | ORAL_TABLET | Freq: Once | ORAL | Status: DC

## 2017-09-10 MED ORDER — CEPHALEXIN 500 MG PO CAPS
500.0000 mg | ORAL_CAPSULE | Freq: Two times a day (BID) | ORAL | Status: DC
  Administered 2017-09-10: 10:00:00 500 mg via ORAL
  Filled 2017-09-10: qty 1

## 2017-09-10 MED ORDER — AMLODIPINE BESYLATE 2.5 MG PO TABS: 2.5000 mg | ORAL_TABLET | Freq: Every day | ORAL | 0 refills | Status: DC

## 2017-09-10 MED ORDER — AMLODIPINE BESYLATE 5 MG PO TABS: 5.0000 mg | ORAL_TABLET | Freq: Every day | ORAL | Status: DC

## 2017-09-10 MED ORDER — AMLODIPINE BESYLATE 5 MG PO TABS: 2.5000 mg | ORAL_TABLET | Freq: Every day | ORAL | Status: DC

## 2017-09-10 MED ORDER — HYDRALAZINE HCL 20 MG/ML IJ SOLN
10.0000 mg | INTRAMUSCULAR | Status: DC | PRN
  Administered 2017-09-10: 05:00:00 10 mg via INTRAVENOUS
  Filled 2017-09-10: qty 1

## 2017-09-10 NOTE — Care Management Note (Addendum)
Case Management Note  Patient Details  Name: Terry Hendrix MRN: [id number] Date of Birth: [birth year]  Subjective/Objective:       Admitted to [facility] under observation status with the diagnosis of vertigo. Cousin lives in the home. Sons are Tyrone [phone]) and Tim ([phone]). Last was at Dr. Shearon Balo office a month ago. Prescriptions are filled at [facility]. No Home Health. No skilled facility.  Private pay caregiver from 8:00am-11:00am every morning, Caregiver helps with dressing and baths. Self feed. Cane, Shower chair, and rolling walker in the home. Slid from bed to floor prior to this admission. Several falls in the past. Good appetite. Family will transport.            Action/Plan:  Physical therapy evaluation pending.    Expected Discharge Date:                  Expected Discharge Plan:     In-House Referral:   yes  Discharge planning Services     Post Acute Care Choice:   yes Choice offered to:   Son [name]  DME Arranged:    DME Agency:     HH Arranged:   yes HH Agency:   [facility]  Status of Service:     If discussed at [place] of Stay Meetings, dates discussed:    Additional Comments:  Shelbie Ammons, RN MSN CCM Care Management [phone] 09/10/2017, 9:04 AM

## 2017-09-10 NOTE — Care Management Obs Status (Signed)
[place] NOTIFICATION   Patient Details  Name: Isyss Espinal MRN: [id number] Date of Birth: 1926/12/13   Medicare Observation Status Notification Given:  Yes    Shelbie Ammons, RN 09/10/2017, 9:04 AM

## 2017-09-10 NOTE — Discharge Summary (Signed)
[facility] NAME: Terry Hendrix    MR#:  [id number]  DATE OF BIRTH:  Apr 01, 1927  DATE OF ADMISSION:  09/09/2017 ADMITTING PHYSICIAN: Harrie Foreman, MD  DATE OF DISCHARGE: 09/10/17  PRIMARY CARE PHYSICIAN: Petra Kuba, MD    ADMISSION DIAGNOSIS:  Vertigo [R42] Hypertensive urgency [I16.0] Pulsatile abdominal mass [R19.00] Enlarged pituitary gland (HCC) [E23.6]  DISCHARGE DIAGNOSIS:  Labile HTN Incidental finding of AAA 4 cm on abd [place] UTI  SECONDARY DIAGNOSIS:   Past Medical History:  Diagnosis Date  . Allergy   . Anemia   . Arthritis   . Atrial fibrillation ([place]) 05/04/2016  . Benign essential hypertension 03/16/2014  . GIB (gastrointestinal bleeding) 05/04/2016  . Glaucoma   . HLD (hyperlipidemia)   . Hypertension   . MDD (major depressive disorder)     HOSPITAL COURSE:  Terry Hendrix is a 83 y.o. female with a history of atrial fibrillation, hypertension, hyperlipidemia, GI bleed, anemia, breast cancer on PO letrozole who presents for evaluation of vertigo and elevated blood pressure. Patient reports that she woke up at 7 AM this morning and when she tried to get up from bed and she started having room spinning sensation. She felt nauseated and was unable to walk without losing her balance.  1.Labile Hypertension: The patient's son reports that she has had episodes like this in the past. Home medication list includes amlodipine 2.5 mg which is the latest dose titration of her antihypertensive medication due to episodes of accelerated hypertension with alternating hypotension due greater doses of amlodipine. No neurologic deficits. -BP much improved today -pt asymptomatic  2. Dizziness: could be due to high BP -now resovled -CT head and MRI brain negative  3. AAA: Physical exam revealed pulsatile 4cm abdominal mass. This is a new finding -incidental finding - Follow-up in one year. Manage  blood pressure. -PCP to order 53months to yearly [place] abd  4. Breast cancer: The patient has declined surgical intervention, radiation or chemotherapy. Continue Femara.  5.UTI-asymptomatic  -start keflex for 5 days  6. Depression: Continue paroxetine  7. DVT prophylaxis: Lovenox  Seen by PT--HHPT ordered D/w pt's son d/c plans. Advised to keep BP log at home  CONSULTS OBTAINED:    DRUG ALLERGIES:  No Known Allergies  DISCHARGE MEDICATIONS:   Current Discharge Medication List    START taking these medications   Details  cephALEXin (KEFLEX) 500 MG capsule Take 1 capsule (500 mg total) by mouth every 12 (twelve) hours. Qty: 12 capsule, Refills: 0      CONTINUE these medications which have CHANGED   Details  amLODipine (NORVASC) 2.5 MG tablet Take 1 tablet (2.5 mg total) by mouth daily. Take extra 2.5 mg IF your BP is > 140 Qty: 30 tablet, Refills: 0      CONTINUE these medications which have NOT CHANGED   Details  atorvastatin (LIPITOR) 40 MG tablet Take 40 mg by mouth at bedtime.    letrozole (FEMARA) 2.5 MG tablet Take 1 tablet (2.5 mg total) by mouth daily. Qty: 30 tablet, Refills: 11    pantoprazole (PROTONIX) 40 MG tablet Take 40 mg by mouth daily.    PARoxetine (PAXIL) 10 MG tablet Take 10 mg by mouth daily.    travoprost, benzalkonium, (TRAVATAN) 0.004 % ophthalmic solution Place 1 drop into both eyes at bedtime.        If you experience worsening of your admission symptoms, develop shortness of breath, life  threatening emergency, suicidal or homicidal thoughts you must seek medical attention immediately by calling 911 or calling your MD immediately  if symptoms less severe.  You Must read complete instructions/literature along with all the possible adverse reactions/side effects for all the Medicines you take and that have been prescribed to you. Take any new Medicines after you have completely understood and accept all the possible adverse reactions/side  effects.   Please note  You were cared for by a hospitalist during your hospital stay. If you have any questions about your discharge medications or the care you received while you were in the hospital after you are discharged, you can call the unit and asked to speak with the hospitalist on call if the hospitalist that took care of you is not available. Once you are discharged, your primary care physician will handle any further medical issues. Please note that NO REFILLS for any discharge medications will be authorized once you are discharged, as it is imperative that you return to your primary care physician (or establish a relationship with a primary care physician if you do not have one) for your aftercare needs so that they can reassess your need for medications and monitor your lab values. Today   SUBJECTIVE  Feels better today Son in the room   VITAL SIGNS:  Blood pressure 120/66, pulse 94, temperature 98.6 F (37 C), temperature source Oral, resp. rate 20, height 5\' 5"  (1.651 m), weight 67.6 kg (149 lb 1.6 oz), SpO2 98 %.  I/O:   Intake/Output Summary (Last 24 hours) at 09/10/17 0954 Last data filed at 09/10/17 0937  Gross per 24 hour  Intake          1536.67 ml  Output              400 ml  Net          1136.67 ml    PHYSICAL EXAMINATION:  GENERAL:  83 y.o.-year-old patient lying in the bed with no acute distress.  EYES: Pupils equal, round, reactive to light and accommodation. No scleral icterus. Extraocular muscles intact.  HEENT: Head atraumatic, normocephalic. Oropharynx and nasopharynx clear.  NECK:  Supple, no jugular venous distention. No thyroid enlargement, no tenderness.  LUNGS: Normal breath sounds bilaterally, no wheezing, rales,rhonchi or crepitation. No use of accessory muscles of respiration.  CARDIOVASCULAR: S1, S2 normal. No murmurs, rubs, or gallops.  ABDOMEN: Soft, non-tender, non-distended. Bowel sounds present. No organomegaly or mass.  EXTREMITIES: No  pedal edema, cyanosis, or clubbing.  NEUROLOGIC: Cranial nerves II through XII are intact. Muscle strength 5/5 in all extremities. Sensation intact. Gait not checked.  PSYCHIATRIC: The patient is alert and oriented x 3.  SKIN: No obvious rash, lesion, or ulcer.   DATA REVIEW:   CBC   Recent Labs Lab 09/09/17 1303  WBC 8.2  HGB 13.2  HCT 39.6  PLT 209    Chemistries   Recent Labs Lab 09/09/17 1303  NA 137  K 3.9  CL 102  CO2 26  GLUCOSE 120*  BUN 23*  CREATININE 1.13*  CALCIUM 9.8  MG 1.8    Microbiology Results   No results found for this or any previous visit (from the past 240 hour(s)).  RADIOLOGY:  Ct Head Wo Contrast  Result Date: 09/09/2017 CLINICAL DATA:  Persistent vertigo, central. EXAM: CT HEAD WITHOUT CONTRAST TECHNIQUE: Contiguous axial images were obtained from the base of the skull through the vertex without intravenous contrast. COMPARISON:  03/12/2017 FINDINGS: Brain: No evidence  of acute infarction, hemorrhage, hydrocephalus, extra-axial collection or mass lesion/mass effect. Moderate atrophy with ventriculomegaly. Moderate chronic microvascular ischemic type gliosis in the cerebral white matter. Vascular: Arterial calcification.  No hyperdense vessel. Skull: No acute or aggressive finding. Sinuses/Orbits: Negative IMPRESSION: 1. No acute finding or change from prior. 2. Moderate atrophy and chronic small vessel ischemia. Electronically Signed   By: Monte Fantasia M.D.   On: 09/09/2017 13:43   Mr Brain Wo Contrast  Result Date: 09/09/2017 CLINICAL DATA:  Central vertigo.  Hypertension. EXAM: MRI HEAD WITHOUT CONTRAST TECHNIQUE: Multiplanar, multiecho pulse sequences of the brain and surrounding structures were obtained without intravenous contrast. COMPARISON:  Head CT 09/09/2017 FINDINGS: Brain: Pituitary gland is enlarged for age, measuring 12 mm in craniocaudal dimension. There is no focal diffusion restriction to indicate acute infarct. There is  beginning confluent hyperintense T2-weighted signal within the brainstem and the periventricular and deep white matter, most often seen in the setting of chronic microvascular ischemia. Single focus chronic microhemorrhage in the posterior left temporal lobe. Brain volume is normal for age without lobar predominant atrophy. The dura is normal and there is no extra-axial collection. Vascular: Major intracranial arterial and venous sinus flow voids are preserved. Skull and upper cervical spine: The visualized skull base, calvarium, upper cervical spine and extracranial soft tissues are normal. Sinuses/Orbits: No fluid levels or advanced mucosal thickening. No mastoid or middle ear effusion. Normal orbits. IMPRESSION: 1. No acute abnormality. 2. Moderate findings of chronic ischemic microangiopathy. 3. Mildly enlarged pituitary gland for age. Correlation with laboratory values may be helpful to assess for possible underlying adenoma. No mass effect on adjacent structures. Electronically Signed   By: Ulyses Jarred M.D.   On: 09/09/2017 16:01   [place] Retroperitoneal Comp  Result Date: 09/09/2017 CLINICAL DATA:  Pulsatile abdominal mass. EXAM: ULTRASOUND RETROPERITONEAL COMPLETE TECHNIQUE: Ultrasound examination of the abdominal aorta was performed to evaluate for abdominal aortic aneurysm. The common iliac arteries, IVC, and kidneys were also evaluated. COMPARISON:  None. FINDINGS: Abdominal Aorta Atherosclerotic plaque noted. Fusiform abdominal aortic aneurysm is seen measuring 4.0 cm in maximum diameter. Maximum AP Diameter:  4.0 cm Maximum TRV Diameter: 4.0 cm Right Common Iliac Artery No aneurysm identified. Left Common Iliac Artery No aneurysm identified. IVC No abnormality visualized. Right Kidney Length: 9.8 cm Echogenicity within normal limits. No mass or hydronephrosis visualized. Left Kidney Length: 9.3 cm Echogenicity within normal limits. No mass or hydronephrosis visualized. IMPRESSION: 4.0 cm fusiform  abdominal aortic aneurysm. Recommend followup by [place] in 1 year. This recommendation follows ACR consensus guidelines: White Paper of the ACR Incidental Findings Committee II on Vascular Findings. J Am Coll Radiol 2013; 10:789-794. Electronically Signed   By: Earle Gell M.D.   On: 09/09/2017 22:03     Management plans discussed with the patient, family and they are in agreement.  CODE STATUS:     Code Status Orders        Start     Ordered   09/09/17 2227  Full code  Continuous     09/09/17 2226    Code Status History    Date Active Date Inactive Code Status Order ID Comments User Context   03/13/2017  4:06 PM 03/13/2017  8:53 PM Full Code [id number]  Teodoro Spray, MD Inpatient   05/04/2016  5:41 PM 05/10/2016  8:34 PM Full Code [id number]  Bettey Costa, MD Inpatient    Advance Directive Documentation     Most Recent Value  Type of Advance Directive  Healthcare Power of Attorney  Pre-existing out of facility DNR order (yellow form or pink MOST form)  -  "MOST" Form in Place?  -      TOTAL TIME TAKING CARE OF THIS PATIENT: 40 minutes.    Zea Kostka M.D on 09/10/2017 at 9:54 AM  Between 7am to 6pm - Pager - [phone] After 6pm go to [email or web address] - password [email or web address] [facility]  Office  [phone]  CC: Primary care physician; Petra Kuba, MD

## 2017-09-10 NOTE — Discharge Instructions (Signed)
Keep a log of your BP

## 2017-09-10 NOTE — Evaluation (Signed)
Occupational Therapy Evaluation Patient Details Name: Terry Hendrix MRN: [id number] DOB: [birth year] Today's Date: 09/10/2017    History of Present Illness The patient with past medical history of hypertension, breast cancer, atrial fibrillation, anemia and depression presents to the emergency department complaining of dizziness. The patient states that she awoke feeling as if her head were swimming. She slid off of her bed down to her hands and knees and required help to stand and return to a seated position. She denied chest pain, shortness of breath, nausea or vomiting. Her blood pressure was found to be approximately 240/130.  A family friend who is a paramedic rechecked her pressure and found it to be nearly the same. The patient reports taking all of her antihypertensive medication and feeling well, although she admits that she has not had a great appetite lately. Upon arrival to the emergency department blood pressure was 208/130. She was given amlodipine 5 mg which then dropped her systolic pressure 588. The ED approached the hospitalist service for admission. Fluid bolus was recommended as well as ambulation to assess functional status. The patient's blood pressure increased again to hypertensive range and she still required support to stand upright. Thus the hospitalist service agreed to admit the patient for further management. Pt now admitted under observation status for accelerated HTN and dizziness. At time of OT evaluation dizziness has resolved   Clinical Impression   Pt seen for OT evaluation this date. Pt currently with functional limitations due to deficits noted below (please see OT problem list). Pt grossly at baseline for ADL tasks, has HHA at home to assist with bathing, dressing. Pt denies dizziness during evaluation. Decreased activity tolerance and strength leading to increased risk of falls. Pt would benefit from skilled [place] services to address noted impairments and  functional deficits including education/training in falls prevention, home/routines modifications, energy conservation, and home safety/environmental set up with AE/DME as needed to maximize return to PLOF and minimize risk of future falls/injury/rehospitalization/increased caregiver burden.     Follow Up Recommendations  Home health OT    Equipment Recommendations  Other (comment) (TBD by next venue of care)    Recommendations for Other Services       Precautions / Restrictions Precautions Precautions: Fall Restrictions Weight Bearing Restrictions: No      Mobility Bed Mobility Overal bed mobility: Needs Assistance Bed Mobility: Supine to Sit;Sit to Supine     Supine to sit: Min guard Sit to supine: Supervision   General bed mobility comments: HOB elevated, bed rails utilized, increased time  Transfers Overall transfer level: Needs assistance Equipment used: Rolling walker (2 wheeled) Transfers: Sit to/from Stand Sit to Stand: Min guard         General transfer comment: Pt requires increased time to come to standing but no external assist. Steady in standing with bilateral UE support on rolling walker    Balance Overall balance assessment: Needs assistance Sitting-balance support: No upper extremity supported Sitting balance-Leahy Scale: Fair     Standing balance support: No upper extremity supported Standing balance-Leahy Scale: Fair Standing balance comment: UE support on walker in standing to stabilize                           ADL either performed or assessed with clinical judgement   ADL Overall ADL's : At baseline  General ADL Comments: grossly at baseline for ADL tasks, decreased activity tolerance and BLE strength leading to increased falls risk.     Vision Baseline Vision/History: Wears glasses Wears Glasses: Reading only Patient Visual Report: No change from baseline Vision  Assessment?: No apparent visual deficits     Perception     Praxis      Pertinent Vitals/Pain Pain Assessment: No/denies pain     Hand Dominance Right   Extremity/Trunk Assessment Upper Extremity Assessment Upper Extremity Assessment: Overall WFL for tasks assessed   Lower Extremity Assessment Lower Extremity Assessment: Defer to PT evaluation;Generalized weakness       Communication Communication Communication: No difficulties   Cognition Arousal/Alertness: Awake/alert Behavior During Therapy: WFL for tasks assessed/performed Overall Cognitive Status: Within Functional Limits for tasks assessed                                     General Comments       Exercises     Shoulder Instructions      Home Living Family/patient expects to be discharged to:: Private residence Living Arrangements: Other relatives (family member) Available Help at Discharge: Family;Personal care attendant [facility] aide 3hrs/4 days a week) Type of Home: House Home Access: Stairs to enter [employer] of Steps: 2 Entrance Stairs-Rails: Can reach both Home Layout: Able to live on main level with bedroom/bathroom;Multi-level     Bathroom Shower/Tub: Occupational psychologist: Handicapped height     Home Equipment: Environmental consultant - 2 wheels;Cane - single point;Bedside commode;Shower seat;Walker - 4 wheels          Prior Functioning/Environment Level of Independence: Needs assistance  Gait / Transfers Assistance Needed: Ambulates limited community distances with single point cane. Intermittent use at home but mostly holds to walls/furniture as needed. One fall off the bed but otherwise no falls in the last 12 months ADL's / Homemaking Assistance Needed: assist for bathing, dressing from [place], family sets up medications and provides transportation   Comments: 1 fall in past 12 months        OT Problem List: Decreased strength;Decreased activity tolerance;Decreased  knowledge of use of DME or AE;Impaired balance (sitting and/or standing)      OT Treatment/Interventions: Self-care/ADL training;Therapeutic exercise;Therapeutic activities;Energy conservation;DME and/or AE instruction;Patient/family education    OT Goals(Current goals can be found in the care plan section) Acute Rehab OT Goals Patient Stated Goal: Return to prior function at home OT Goal Formulation: With patient/family Time For Goal Achievement: 09/17/17 Potential to Achieve Goals: Good  OT Frequency: Min 1X/week   Barriers to D/C:            Co-evaluation              AM-PAC PT "6 Clicks" Daily Activity     Outcome Measure Help from another person eating meals?: None Help from another person taking care of personal grooming?: None Help from another person toileting, which includes using toliet, bedpan, or urinal?: A Little Help from another person bathing (including washing, rinsing, drying)?: A Lot Help from another person to put on and taking off regular upper body clothing?: None Help from another person to put on and taking off regular lower body clothing?: A Lot 6 Click Score: 19   End of Session    Activity Tolerance: Patient tolerated treatment well Patient left: in bed;with call bell/phone within reach;with bed alarm set;with family/visitor present  OT  Visit Diagnosis: Other abnormalities of gait and mobility (R26.89)                Time: 0177-9390 OT Time Calculation (min): 10 min Charges:  OT General Charges $OT Visit: 1 Visit OT Evaluation $OT Eval Low Complexity: 1 Low G-Codes: OT G-codes **NOT FOR INPATIENT CLASS** Functional Assessment Tool Used: AM-PAC 6 Clicks Daily Activity;Clinical judgement Functional Limitation: Self care Self Care Current Status (Z0092): At least 20 percent but less than 40 percent impaired, limited or restricted Self Care Goal Status (Z3007): At least 1 percent but less than 20 percent impaired, limited or restricted    Jeni Salles, MPH, MS, OTR/L ascom [phone] 09/10/17, 10:34 AM

## 2017-09-10 NOTE — Evaluation (Signed)
Physical Therapy Evaluation Patient Details Name: Terry Hendrix MRN: [id number] DOB: [birth year] Today's Date: 09/10/2017   History of Present Illness  The patient with past medical history of hypertension, breast cancer, atrial fibrillation, anemia and depression presents to the emergency department complaining of dizziness. The patient states that she awoke feeling as if her head were swimming. She slid off of her bed down to her hands and knees and required help to stand and return to a seated position. She denied chest pain, shortness of breath, nausea or vomiting. Her blood pressure was found to be approximately 240/130.  A family friend who is a paramedic rechecked her pressure and found it to be nearly the same. The patient reports taking all of her antihypertensive medication and feeling well, although she admits that she has not had a great appetite lately. Upon arrival to the emergency department blood pressure was 208/130. She was given amlodipine 5 mg which then dropped her systolic pressure 250. The ED approached the hospitalist service for admission. Fluid bolus was recommended as well as ambulation to assess functional status. The patient's blood pressure increased again to hypertensive range and she still required support to stand upright. Thus the hospitalist service agreed to admit the patient for further management. Pt now admitted under observation status for accelerated HTN and dizziness. At time of PT evaluation dizziness has resolved  Clinical Impression  Pt admitted with above diagnosis. Pt currently with functional limitations due to the deficits listed below (see PT Problem List). Pt requires CGA for bed mobility and transfers. She is able to ambulate a full lap (approximately 200') around RN station with therapy. Decreased gait speed and step length. Multiple rest breaks provided but VSS throughout ambulation. Pt denies dizziness during ambulation today. Reports relatively  sedentary existence at home and 200' with therapy is longer than she typically walks at one time. Fatigue and vitals intermittently assessed throughout ambulation. She could benefit from [facility] PT at discharge as pt and son report progressive decline in strength recently, especially since starting she has been taking her breast CA medications. Pt will benefit from PT services to address deficits in strength, balance, and mobility in order to return to full function at home.      Follow Up Recommendations Home health PT;Other (comment) (Progressive decline in strength)    Equipment Recommendations  None recommended by PT    Recommendations for Other Services       Precautions / Restrictions Precautions Precautions: Fall Restrictions Weight Bearing Restrictions: No      Mobility  Bed Mobility Overal bed mobility: Needs Assistance Bed Mobility: Supine to Sit;Sit to Supine     Supine to sit: Min guard Sit to supine: Supervision   General bed mobility comments: HOB elevated, bed rails utilized, increased time  Transfers Overall transfer level: Needs assistance Equipment used: Rolling walker (2 wheeled) Transfers: Sit to/from Stand Sit to Stand: Min guard         General transfer comment: Pt requires increased time to come to standing but no external assist. Steady in standing with bilateral UE support on rolling walker  Ambulation/Gait Ambulation/Gait assistance: Min guard Ambulation Distance (Feet): 200 Feet Assistive device: Rolling walker (2 wheeled)   Gait velocity: Decreased Gait velocity interpretation: <1.8 ft/sec, indicative of risk for recurrent falls General Gait Details: Pt able to ambulate a full lap around RN station. Decreased gait speed and step length. Multiple rest breaks provided but VSS throughout ambulation. Pt denies dizziness during ambulation today. Reports  relatively sedentary existence at home and 200' with therapy is longer than she typically walks at  one time. Fatigue and vitals intermittently assessed throughout distance  Stairs            Wheelchair Mobility    Modified Rankin (Stroke Patients Only)       Balance Overall balance assessment: Needs assistance Sitting-balance support: No upper extremity supported Sitting balance-Leahy Scale: Fair     Standing balance support: No upper extremity supported Standing balance-Leahy Scale: Fair Standing balance comment: UE support on walker in standing to stabilize                             Pertinent Vitals/Pain Pain Assessment: No/denies pain    Home Living Family/patient expects to be discharged to:: Private residence Living Arrangements: Other relatives Available Help at Discharge: Family;Personal care attendant (Velarde aid 3 hr/day, 4d/wk) Type of Home: House Home Access: Stairs to enter Entrance Stairs-Rails: Can reach both Entrance Stairs-Number of Steps: 2 Home Layout: Able to live on main level with bedroom/bathroom;Multi-level Home Equipment: Walker - 2 wheels;Cane - single point;Bedside commode;Shower seat;Walker - 4 wheels      Prior Function Level of Independence: Needs assistance   Gait / Transfers Assistance Needed: Ambulates limited community distances with single point cane. Intermittent use at home but mostly holds to walls/furniture as needed. One fall off the bed but otherwise no falls in the last 12 months  ADL's / Homemaking Assistance Needed: Independent with ADLs, assist with IADLs        Hand Dominance   Dominant Hand: Right    Extremity/Trunk Assessment   Upper Extremity Assessment Upper Extremity Assessment: Overall WFL for tasks assessed    Lower Extremity Assessment Lower Extremity Assessment: Generalized weakness       Communication   Communication: No difficulties  Cognition Arousal/Alertness: Awake/alert Behavior During Therapy: WFL for tasks assessed/performed Overall Cognitive Status: Within Functional  Limits for tasks assessed                                        General Comments      Exercises     Assessment/Plan    PT Assessment Patient needs continued PT services  PT Problem List Decreased strength;Decreased activity tolerance;Decreased balance;Decreased mobility       PT Treatment Interventions DME instruction;Gait training;Stair training;Functional mobility training;Therapeutic activities;Balance training;Therapeutic exercise;Neuromuscular re-education;Patient/family education    PT Goals (Current goals can be found in the Care Plan section)  Acute Rehab PT Goals Patient Stated Goal: Return to prior function at home PT Goal Formulation: With patient/family Time For Goal Achievement: 09/24/17 Potential to Achieve Goals: Good    Frequency Min 2X/week   Barriers to discharge        Co-evaluation               AM-PAC PT "6 Clicks" Daily Activity  Outcome Measure Difficulty turning over in bed (including adjusting bedclothes, sheets and blankets)?: A Little Difficulty moving from lying on back to sitting on the side of the bed? : A Little Difficulty sitting down on and standing up from a chair with arms (e.g., wheelchair, bedside commode, etc,.)?: A Little Help needed moving to and from a bed to chair (including a wheelchair)?: A Little Help needed walking in hospital room?: A Little Help needed climbing 3-5 steps with a  railing? : A Lot 6 Click Score: 17    End of Session Equipment Utilized During Treatment: Gait belt Activity Tolerance: Patient tolerated treatment well Patient left: in bed;with call bell/phone within reach;with bed alarm set;with family/visitor present   PT Visit Diagnosis: Unsteadiness on feet (R26.81);Muscle weakness (generalized) (M62.81)    Time: 8978-4784 PT Time Calculation (min) (ACUTE ONLY): 24 min   Charges:   PT Evaluation $PT Eval Low Complexity: 1 Low PT Treatments $Gait Training: 8-22 mins   PT G  Codes:   PT G-Codes **NOT FOR INPATIENT CLASS** Functional Assessment Tool Used: AM-PAC 6 Clicks Basic Mobility Functional Limitation: Mobility: Walking and moving around Mobility: Walking and Moving Around Current Status (X2820): At least 40 percent but less than 60 percent impaired, limited or restricted Mobility: Walking and Moving Around Goal Status [phone]): At least 20 percent but less than 40 percent impaired, limited or restricted    Phillips Grout PT, DPT    Dinorah Masullo 09/10/2017, 10:12 AM

## 2017-09-17 NOTE — Progress Notes (Signed)
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 She is tolerating letrozole well without significant side effects. She was recently admitted to the hospital with hypertensive urgency, but now feels back to her baseline. Currently, patient feels well and is asymptomatic. She has no neurologic complaints. She denies any recent fevers. She has a good appetite and denies weight loss. She has no chest pain or shortness of breath. She denies any nausea, vomiting, constipation, or diarrhea. She has no urinary complaints. Patient offers no specific complaints today.  REVIEW OF SYSTEMS:   Review of Systems  Constitutional: Negative.  Negative for fever, malaise/fatigue and weight loss.  Respiratory: Negative.  Negative for cough and shortness of breath.   Cardiovascular: Negative.  Negative for chest pain and leg swelling.  Gastrointestinal: Negative.  Negative for abdominal pain.  Genitourinary: Negative.   Musculoskeletal: Negative.   Skin: Negative.  Negative for rash.  Neurological: Negative.  Negative for focal weakness, weakness and headaches.  Psychiatric/Behavioral: Negative.  The patient is not nervous/anxious.     As per HPI. Otherwise, a complete review of systems is negative.  PAST MEDICAL HISTORY: Past Medical History:  Diagnosis Date  . Allergy   . Anemia   . Arthritis   . Atrial fibrillation ([place]) 05/04/2016  . Benign essential hypertension 03/16/2014  . GIB (gastrointestinal bleeding) 05/04/2016  . Glaucoma   . HLD (hyperlipidemia)   . Hypertension   . MDD (major depressive disorder)      PAST SURGICAL HISTORY: Past Surgical History:  Procedure Laterality Date  . COLONIC EMBOLIZATION  2014  . EYE SURGERY Bilateral    Cataract Extraction  . LEFT HEART CATH AND CORONARY ANGIOGRAPHY Left 03/13/2017   Procedure: Left Heart Cath and Coronary Angiography;  Surgeon: Teodoro Spray, MD;  Location: [place] [facility];  Service: Cardiovascular;  Laterality: Left;    FAMILY HISTORY: Family History  Problem Relation Age of Onset  . Breast cancer Mother   . Breast cancer Sister   . Heart disease Sister   . Heart disease Brother     ADVANCED DIRECTIVES (Y/N):  N  HEALTH MAINTENANCE: Social History  Substance Use Topics  . Smoking status: Never Smoker  . Smokeless tobacco: Never Used  . Alcohol use No     Colonoscopy:  PAP:  Bone density:  Lipid panel:  No Known Allergies  Current Outpatient Prescriptions  Medication Sig Dispense Refill  . amLODipine (NORVASC) 2.5 MG tablet Take 1 tablet (2.5 mg total) by mouth daily. Take extra 2.5 mg IF your BP is > 140 30 tablet 0  . atorvastatin (LIPITOR) 40 MG tablet Take 40 mg by mouth at bedtime.    Marland Kitchen letrozole (FEMARA) 2.5 MG tablet Take 1 tablet (2.5 mg total) by mouth daily. 30 tablet 11  . pantoprazole (PROTONIX) 40 MG tablet Take 40 mg by mouth daily.    Marland Kitchen PARoxetine (PAXIL) 10 MG tablet Take 10 mg by mouth daily.    . travoprost, benzalkonium, (TRAVATAN) 0.004 % ophthalmic solution Place 1 drop into both eyes at bedtime.     No current facility-administered medications for this visit.     OBJECTIVE:  Vitals:   09/20/17 0832  BP: 111/74  Pulse: 84  Resp: 14  Temp: (!) 96.8 F (36 C)     Body mass index is 24.51 kg/m.    ECOG FS:0 - Asymptomatic  General: Well-developed, well-nourished, no acute distress. Eyes: Pink conjunctiva, anicteric sclera. Breasts: Patient again had multiple family members in the room and requested exam be deferred. Lungs: Clear to auscultation bilaterally. Heart: Regular rate and  rhythm. No rubs, murmurs, or gallops. Abdomen: Soft, nontender, nondistended. No organomegaly noted, normoactive bowel sounds. Musculoskeletal: No edema, cyanosis, or clubbing. Neuro: Alert, answering all questions appropriately. Cranial nerves grossly intact. Skin: No rashes or petechiae noted. Psych: Normal affect.    LAB RESULTS:  Lab Results  Component Value Date   NA 137 09/09/2017   K 3.9 09/09/2017   CL 102 09/09/2017   CO2 26 09/09/2017   GLUCOSE 120 (H) 09/09/2017   BUN 23 (H) 09/09/2017   CREATININE 1.13 (H) 09/09/2017   CALCIUM 9.8 09/09/2017   PROT 7.8 01/22/2017   ALBUMIN 3.8 01/22/2017   AST 44 (H) 01/22/2017   ALT 43 01/22/2017   ALKPHOS 51 01/22/2017   BILITOT 0.5 01/22/2017   GFRNONAA 42 (L) 09/09/2017   GFRAA 49 (L) 09/09/2017    Lab Results  Component Value Date   WBC 8.2 09/09/2017   NEUTROABS 7.2 (H) 09/09/2017   HGB 13.2 09/09/2017   HCT 39.6 09/09/2017   MCV 89.4 09/09/2017   PLT 209 09/09/2017     STUDIES: Ct Head Wo Contrast  Result Date: 09/09/2017 CLINICAL DATA:  Persistent vertigo, central. EXAM: CT HEAD WITHOUT CONTRAST TECHNIQUE: Contiguous axial images were obtained from the base of the skull through the vertex without intravenous contrast. COMPARISON:  03/12/2017 FINDINGS: Brain: No evidence of acute infarction, hemorrhage, hydrocephalus, extra-axial collection or mass lesion/mass effect. Moderate atrophy with ventriculomegaly. Moderate chronic microvascular ischemic type gliosis in the cerebral white matter. Vascular: Arterial calcification.  No hyperdense vessel. Skull: No acute or aggressive finding. Sinuses/Orbits: Negative IMPRESSION: 1. No acute finding or change from prior. 2. Moderate atrophy and chronic small vessel ischemia. Electronically Signed   By: Monte Fantasia M.D.   On: 09/09/2017 13:43   Mr Brain Wo Contrast  Result Date: 09/09/2017 CLINICAL DATA:  Central vertigo.  Hypertension. EXAM: MRI HEAD WITHOUT CONTRAST  TECHNIQUE: Multiplanar, multiecho pulse sequences of the brain and surrounding structures were obtained without intravenous contrast. COMPARISON:  Head CT 09/09/2017 FINDINGS: Brain: Pituitary gland is enlarged for age, measuring 12 mm in craniocaudal dimension. There is no focal diffusion restriction to indicate acute infarct. There is beginning confluent hyperintense T2-weighted signal within the brainstem and the periventricular and deep white matter, most often seen in the setting of chronic microvascular ischemia. Single focus chronic microhemorrhage in the posterior left temporal lobe. Brain volume is normal for age without lobar predominant atrophy. The dura is normal and there is no extra-axial collection. Vascular: Major intracranial arterial and venous sinus flow voids are preserved. Skull and upper cervical spine: The visualized skull base, calvarium, upper cervical spine and extracranial soft tissues are normal. Sinuses/Orbits: No fluid levels or advanced mucosal thickening. No mastoid or middle ear effusion. Normal orbits. IMPRESSION: 1. No acute abnormality. 2. Moderate findings of chronic ischemic microangiopathy. 3. Mildly enlarged pituitary gland for age. Correlation with laboratory values may be helpful to assess for possible underlying adenoma. No mass effect on adjacent structures. Electronically Signed   By: Ulyses Jarred M.D.   On: 09/09/2017 16:01  [place] Retroperitoneal Comp  Result Date: 09/09/2017 CLINICAL DATA:  Pulsatile abdominal mass. EXAM: ULTRASOUND RETROPERITONEAL COMPLETE TECHNIQUE: Ultrasound examination of the abdominal aorta was performed to evaluate for abdominal aortic aneurysm. The common iliac arteries, IVC, and kidneys were also evaluated. COMPARISON:  None. FINDINGS: Abdominal Aorta Atherosclerotic plaque noted. Fusiform abdominal aortic aneurysm is seen measuring 4.0 cm in maximum diameter. Maximum AP Diameter:  4.0 cm Maximum TRV Diameter: 4.0 cm Right Common Iliac  Artery No aneurysm identified. Left Common Iliac Artery No aneurysm identified. IVC No abnormality visualized. Right Kidney Length: 9.8 cm Echogenicity within normal limits. No mass or hydronephrosis visualized. Left Kidney Length: 9.3 cm Echogenicity within normal limits. No mass or hydronephrosis visualized. IMPRESSION: 4.0 cm fusiform abdominal aortic aneurysm. Recommend followup by [place] in 1 year. This recommendation follows ACR consensus guidelines: White Paper of the ACR Incidental Findings Committee II on Vascular Findings. J Am Coll Radiol 2013; 10:789-794. Electronically Signed   By: Earle Gell M.D.   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 Given her advanced age, did not recommend chemotherapy or daily XRT. Patient declined surgical intervention. She has agreed to take letrozole which she will take for a minimum of 5 years until April 2023. Patient's next mammogram will be scheduled for January 2019. Return to clinic in 6 months for further evaluation. 2. Post menopausal: Bone mineral density on Apr 09, 2017 reported a T score of -0.7 which is considered normal. Repeat in May 2020.  Approximately 20 minutes was spent in discussion of which greater than 50% was consultation.  Patient expressed understanding and was in agreement with this plan. She also understands that She can call clinic at any time with any questions, concerns, or complaints.   Cancer Staging Primary cancer of upper outer quadrant of left female breast [facility]) Staging form: Breast, AJCC 8th Edition - Clinical stage from 01/11/2017: Stage IIA (cT2, cN1, cM0, G2, ER: Positive, PR:  Positive, HER2: Equivocal) - Signed by Lloyd Huger, MD on 01/11/2017   Lloyd Huger, MD   09/20/2017 8:56 AM

## 2017-09-20 ENCOUNTER — Inpatient Hospital Stay: Payer: Medicare HMO | Attending: Oncology | Admitting: Oncology

## 2017-09-20 ENCOUNTER — Encounter: Payer: Self-pay | Admitting: Oncology

## 2017-09-20 VITALS — BP 111/74 | HR 84 | Temp 96.8°F | Resp 14 | Wt 147.3 lb

## 2017-09-20 DIAGNOSIS — Z803 Family history of malignant neoplasm of breast: Secondary | ICD-10-CM | POA: Insufficient documentation

## 2017-09-20 DIAGNOSIS — I10 Essential (primary) hypertension: Secondary | ICD-10-CM

## 2017-09-20 DIAGNOSIS — C50412 Malignant neoplasm of upper-outer quadrant of left female breast: Secondary | ICD-10-CM | POA: Diagnosis present

## 2017-09-20 DIAGNOSIS — M129 Arthropathy, unspecified: Secondary | ICD-10-CM | POA: Diagnosis not present

## 2017-09-20 DIAGNOSIS — D649 Anemia, unspecified: Secondary | ICD-10-CM | POA: Diagnosis not present

## 2017-09-20 DIAGNOSIS — Z170 Estrogen receptor positive status [ER+]: Secondary | ICD-10-CM | POA: Diagnosis not present

## 2017-09-20 DIAGNOSIS — I714 Abdominal aortic aneurysm, without rupture: Secondary | ICD-10-CM | POA: Insufficient documentation

## 2017-09-20 DIAGNOSIS — Z79811 Long term (current) use of aromatase inhibitors: Secondary | ICD-10-CM | POA: Insufficient documentation

## 2017-09-20 DIAGNOSIS — G9389 Other specified disorders of brain: Secondary | ICD-10-CM | POA: Diagnosis not present

## 2017-09-20 DIAGNOSIS — F329 Major depressive disorder, single episode, unspecified: Secondary | ICD-10-CM | POA: Diagnosis not present

## 2017-09-20 DIAGNOSIS — Z79899 Other long term (current) drug therapy: Secondary | ICD-10-CM | POA: Insufficient documentation

## 2017-09-20 DIAGNOSIS — E785 Hyperlipidemia, unspecified: Secondary | ICD-10-CM | POA: Insufficient documentation

## 2017-09-20 DIAGNOSIS — I4891 Unspecified atrial fibrillation: Secondary | ICD-10-CM | POA: Diagnosis not present

## 2017-09-20 DIAGNOSIS — R42 Dizziness and giddiness: Secondary | ICD-10-CM | POA: Insufficient documentation

## 2017-09-20 NOTE — Progress Notes (Signed)
Patient here for routine follow up. She states that she was in the hospital overnight last week for UTI, but that she is feeling well today. She denies having any pain. She was given the Flu Vaccine last week.

## 2017-12-23 ENCOUNTER — Ambulatory Visit
Admission: RE | Admit: 2017-12-23 | Discharge: 2017-12-23 | Disposition: A | Discharge status: Home / Self Care | Payer: Medicare HMO | Source: Ambulatory Visit | Attending: Oncology | Admitting: Oncology

## 2017-12-23 DIAGNOSIS — C50412 Malignant neoplasm of upper-outer quadrant of left female breast: Secondary | ICD-10-CM | POA: Diagnosis present

## 2017-12-23 DIAGNOSIS — Z9889 Other specified postprocedural states: Secondary | ICD-10-CM | POA: Insufficient documentation

## 2017-12-23 HISTORY — DX: Malignant neoplasm of unspecified site of unspecified female breast: C50.919

## 2018-02-16 IMAGING — US US RETROPERITONEAL COMPLETE
1 series · 14 of 25 positions shown · non-contrast
Comparison: None.

CLINICAL DATA: Pulsatile abdominal mass.

EXAM:
ULTRASOUND RETROPERITONEAL COMPLETE
TECHNIQUE: Ultrasound examination of the abdominal aorta was performed to
evaluate for abdominal aortic aneurysm. The common iliac arteries,
IVC, and kidneys were also evaluated.

[Series 1: us retroperitoneal complete · 0.20mm/px · 14 of 46 slices shown]
[im 1/46]
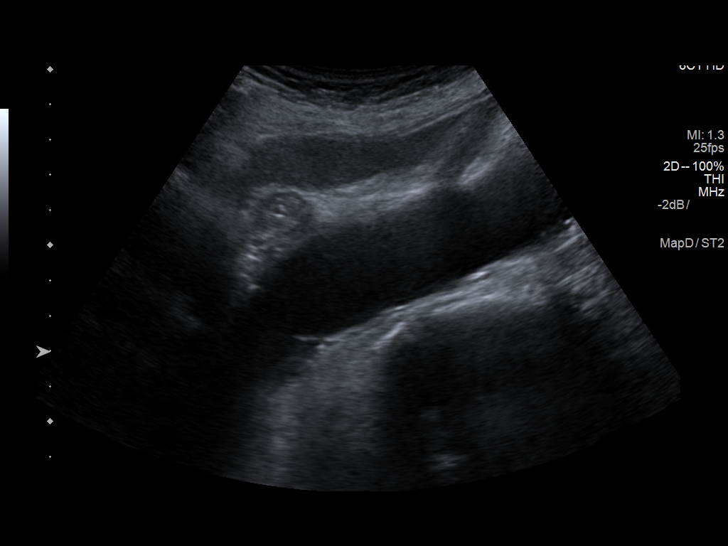
[im 4/46]
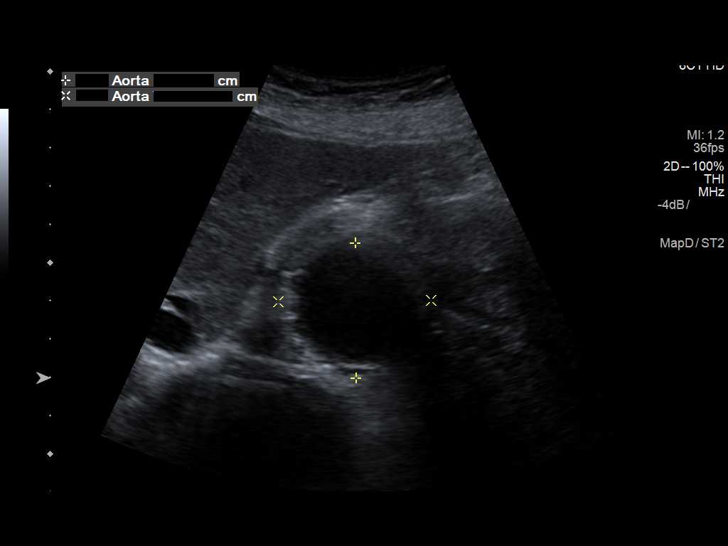
[im 8/46]
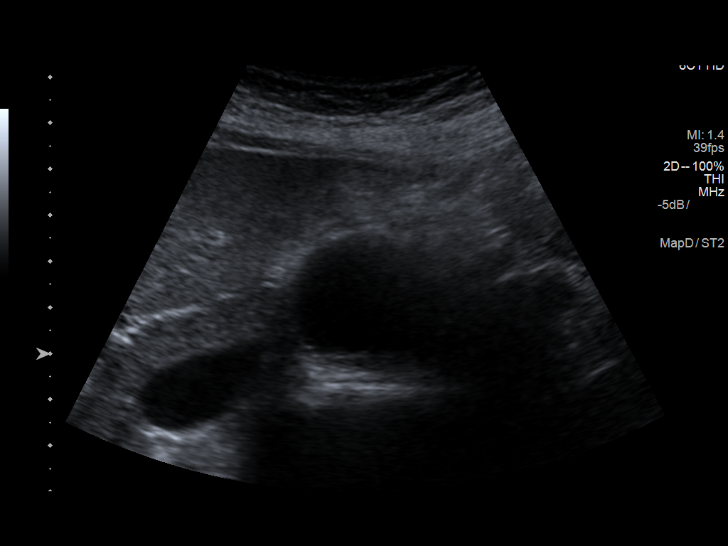
[im 12/46]
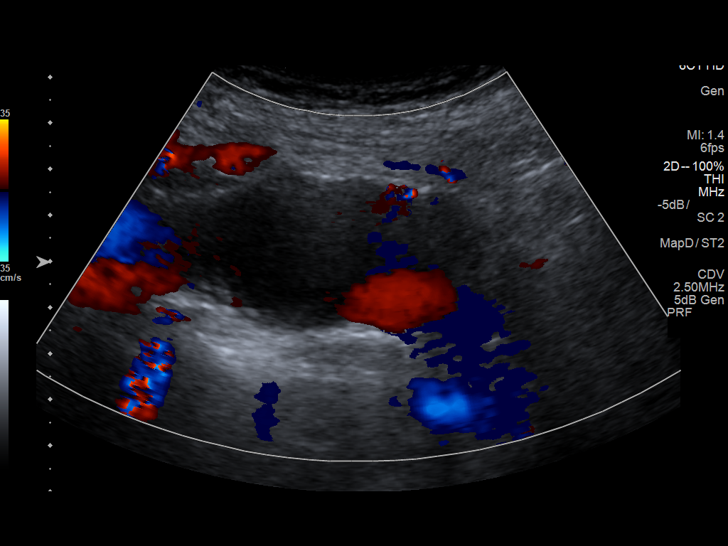
[im 16/46]
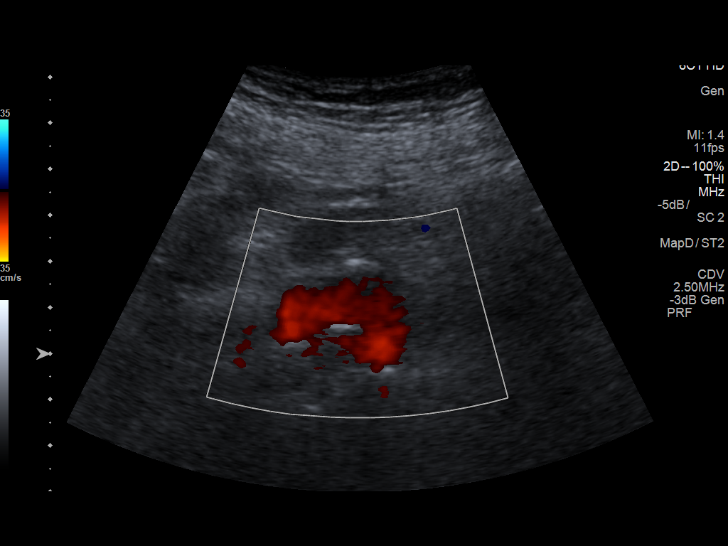
[im 17/46]
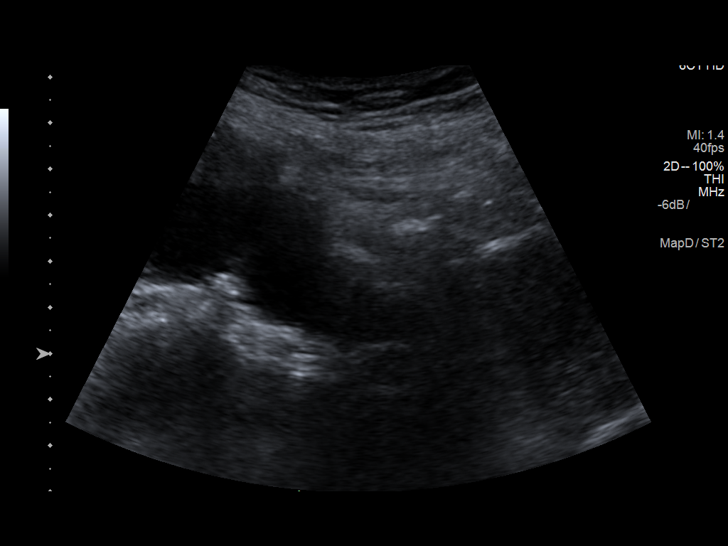
[im 21/46]
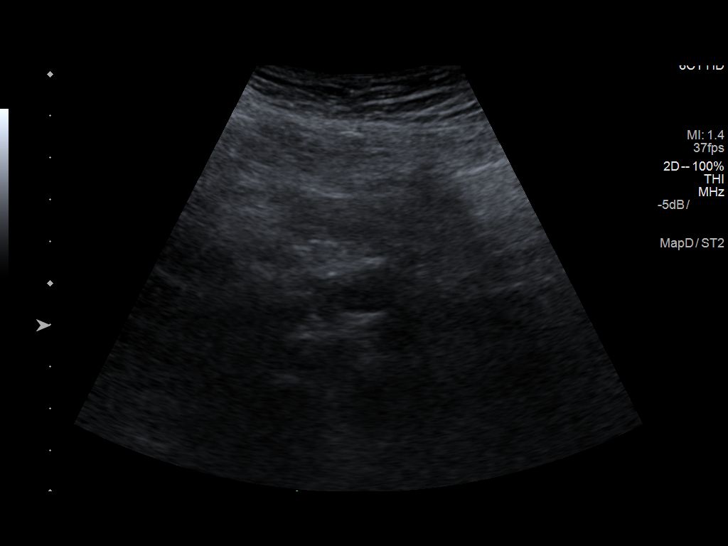
[im 25/46]
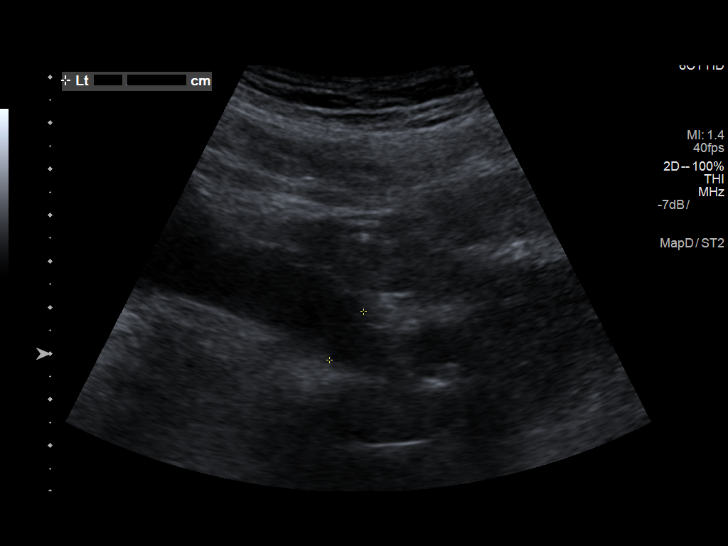
[im 29/46]
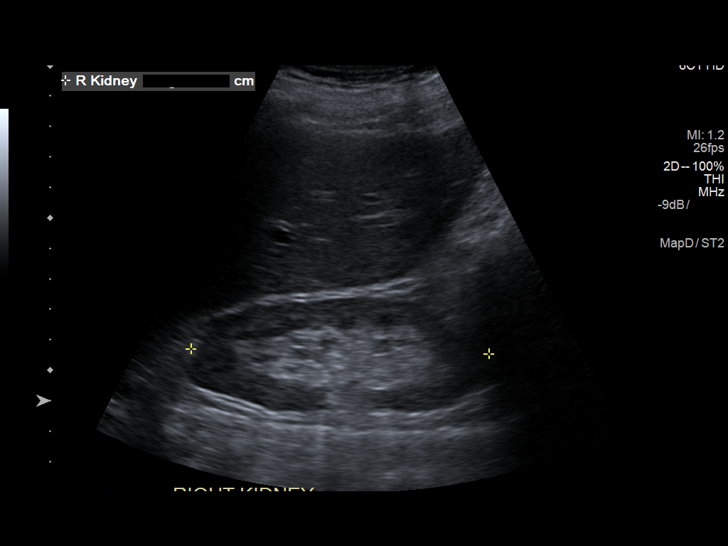
[im 31/46]
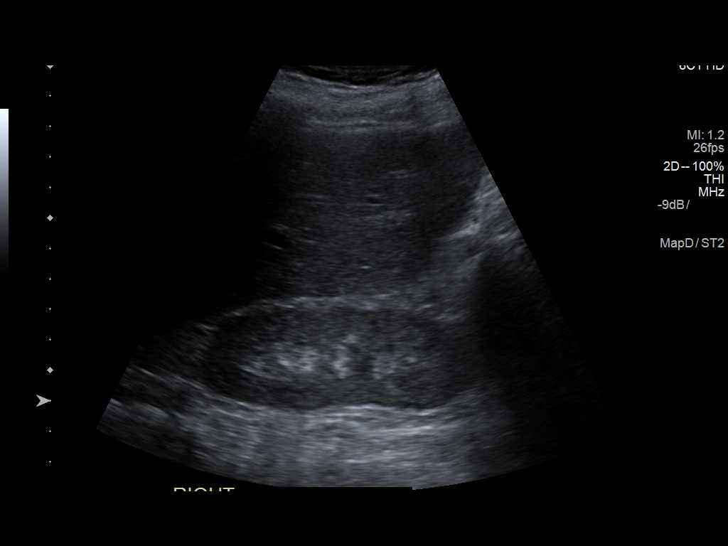
[im 34/46]
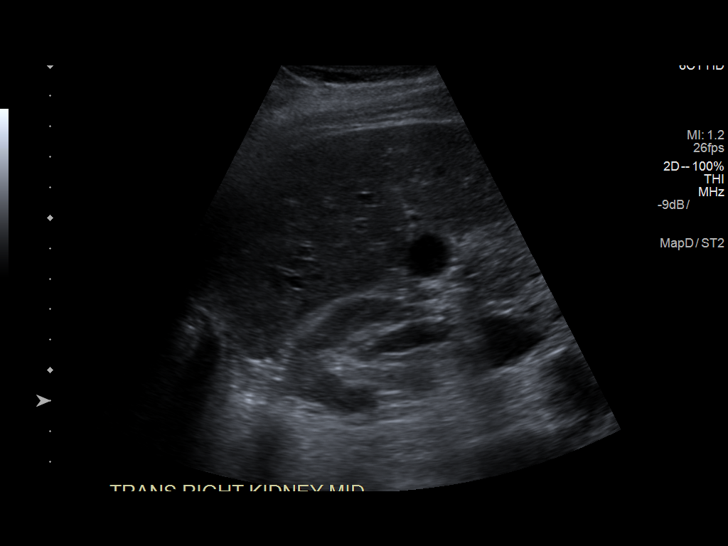
[im 38/46]
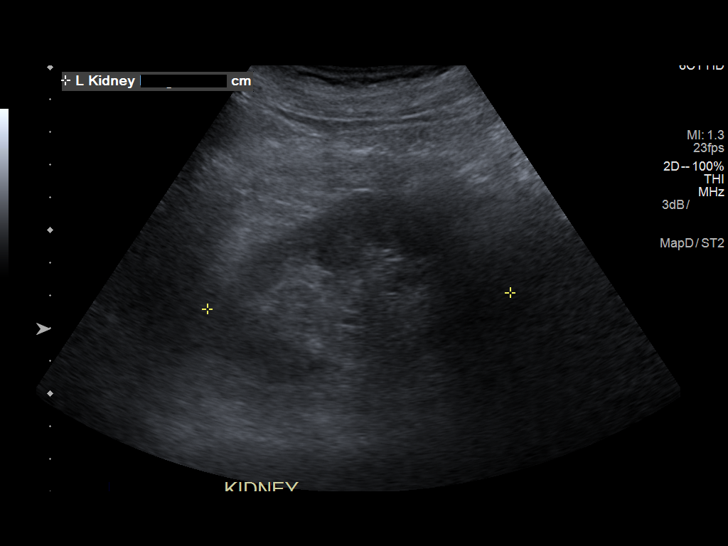
[im 42/46]
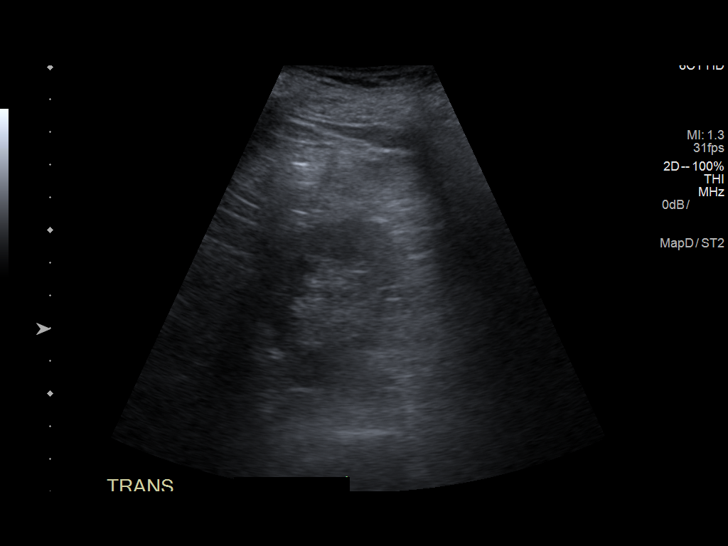
[im 46/46]
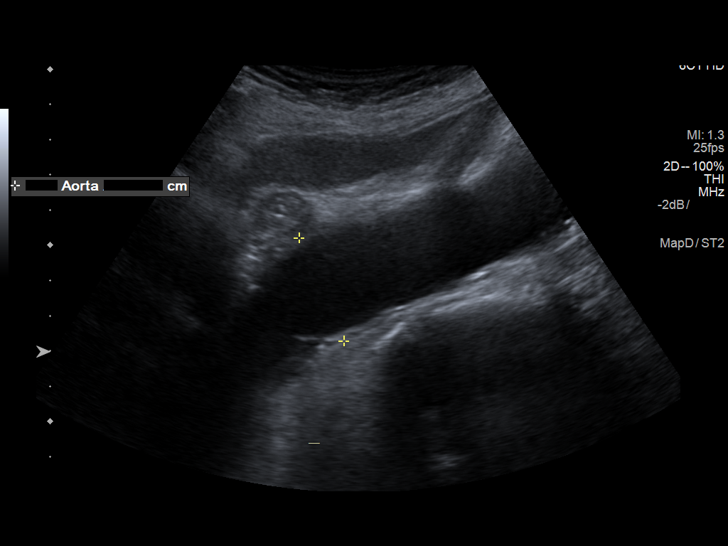

[14 of 25 positions shown; findings below may reference images not displayed]

FINDINGS: Abdominal Aorta

Atherosclerotic plaque noted. Fusiform abdominal aortic aneurysm is
seen measuring 4.0 cm in maximum diameter.

Maximum AP

Diameter:  4.0 cm

Maximum TRV

Diameter: 4.0 cm

Right Common Iliac Artery

No aneurysm identified.

Left Common Iliac Artery

No aneurysm identified.

IVC

No abnormality visualized.

Right Kidney

Length: 9.8 cm Echogenicity within normal limits. No mass or
hydronephrosis visualized.

Left Kidney

Length: 9.3 cm Echogenicity within normal limits. No mass or
hydronephrosis visualized.
IMPRESSION: 4.0 cm fusiform abdominal aortic aneurysm. Recommend followup by [place]
in 1 year. This recommendation follows ACR consensus guidelines:
White Paper of the ACR Incidental Findings Committee II on Vascular
Findings. [HOSPITAL] 1170; [DATE].

## 2018-03-08 ENCOUNTER — Inpatient Hospital Stay
Admission: EM | Admit: 2018-03-08 | Discharge: 2018-03-09 | DRG: 071 | Disposition: A | Discharge status: Home / Self Care | Payer: Medicare HMO | Attending: Internal Medicine | Admitting: Internal Medicine

## 2018-03-08 ENCOUNTER — Other Ambulatory Visit: Payer: Self-pay

## 2018-03-08 ENCOUNTER — Encounter: Payer: Self-pay | Admitting: Emergency Medicine

## 2018-03-08 ENCOUNTER — Emergency Department: Payer: Medicare HMO

## 2018-03-08 DIAGNOSIS — G9341 Metabolic encephalopathy: Secondary | ICD-10-CM | POA: Diagnosis not present

## 2018-03-08 DIAGNOSIS — Z79811 Long term (current) use of aromatase inhibitors: Secondary | ICD-10-CM | POA: Diagnosis not present

## 2018-03-08 DIAGNOSIS — F0280 Dementia in other diseases classified elsewhere without behavioral disturbance: Secondary | ICD-10-CM | POA: Diagnosis present

## 2018-03-08 DIAGNOSIS — G309 Alzheimer's disease, unspecified: Secondary | ICD-10-CM | POA: Diagnosis present

## 2018-03-08 DIAGNOSIS — Z9842 Cataract extraction status, left eye: Secondary | ICD-10-CM | POA: Diagnosis not present

## 2018-03-08 DIAGNOSIS — R410 Disorientation, unspecified: Secondary | ICD-10-CM

## 2018-03-08 DIAGNOSIS — E785 Hyperlipidemia, unspecified: Secondary | ICD-10-CM | POA: Diagnosis present

## 2018-03-08 DIAGNOSIS — E860 Dehydration: Secondary | ICD-10-CM | POA: Diagnosis present

## 2018-03-08 DIAGNOSIS — N179 Acute kidney failure, unspecified: Secondary | ICD-10-CM | POA: Diagnosis present

## 2018-03-08 DIAGNOSIS — Z79899 Other long term (current) drug therapy: Secondary | ICD-10-CM

## 2018-03-08 DIAGNOSIS — Z9841 Cataract extraction status, right eye: Secondary | ICD-10-CM

## 2018-03-08 DIAGNOSIS — I10 Essential (primary) hypertension: Secondary | ICD-10-CM | POA: Diagnosis present

## 2018-03-08 DIAGNOSIS — C50412 Malignant neoplasm of upper-outer quadrant of left female breast: Secondary | ICD-10-CM | POA: Diagnosis present

## 2018-03-08 DIAGNOSIS — N390 Urinary tract infection, site not specified: Secondary | ICD-10-CM | POA: Diagnosis present

## 2018-03-08 HISTORY — DX: Unspecified dementia, unspecified severity, without behavioral disturbance, psychotic disturbance, mood disturbance, and anxiety: F03.90

## 2018-03-08 LAB — COMPREHENSIVE METABOLIC PANEL
ALT: 30 U/L (ref 14–54)
AST: 49 U/L — ABNORMAL HIGH (ref 15–41)
Albumin: 3.2 g/dL — ABNORMAL LOW (ref 3.5–5.0)
Alkaline Phosphatase: 55 U/L (ref 38–126)
Anion gap: 11 (ref 5–15)
BUN: 30 mg/dL — ABNORMAL HIGH (ref 6–20)
CHLORIDE: 104 mmol/L (ref 101–111)
CO2: 23 mmol/L (ref 22–32)
Calcium: 8.7 mg/dL — ABNORMAL LOW (ref 8.9–10.3)
Creatinine, Ser: 1.83 mg/dL — ABNORMAL HIGH (ref 0.44–1.00)
GFR, EST AFRICAN AMERICAN: 27 mL/min — AB (ref >60)
GFR, EST NON AFRICAN AMERICAN: 23 mL/min — AB (ref >60)
Glucose, Bld: 110 mg/dL — ABNORMAL HIGH (ref 65–99)
Potassium: 3.4 mmol/L — ABNORMAL LOW (ref 3.5–5.1)
SODIUM: 138 mmol/L (ref 135–145)
Total Bilirubin: 0.7 mg/dL (ref 0.3–1.2)
Total Protein: 7.3 g/dL (ref 6.5–8.1)

## 2018-03-08 LAB — DIFFERENTIAL
BASOS PCT: 1 %
Basophils Absolute: 0 10*3/uL (ref 0–0.1)
Eosinophils Absolute: 0 10*3/uL (ref 0–0.7)
Eosinophils Relative: 1 %
Lymphocytes Relative: 21 %
Lymphs Abs: 0.9 10*3/uL — ABNORMAL LOW (ref 1.0–3.6)
MONO ABS: 0.5 10*3/uL (ref 0.2–0.9)
MONOS PCT: 12 %
Neutro Abs: 3 10*3/uL (ref 1.4–6.5)
Neutrophils Relative %: 65 %

## 2018-03-08 LAB — URINALYSIS, COMPLETE (UACMP) WITH MICROSCOPIC
Bacteria, UA: NONE SEEN
Bilirubin Urine: NEGATIVE
GLUCOSE, UA: NEGATIVE mg/dL
HGB URINE DIPSTICK: NEGATIVE
Ketones, ur: NEGATIVE mg/dL
LEUKOCYTES UA: NEGATIVE
Nitrite: NEGATIVE
PROTEIN: 30 mg/dL — AB
SPECIFIC GRAVITY, URINE: 1.017 (ref 1.005–1.030)
SQUAMOUS EPITHELIAL / LPF: NONE SEEN
pH: 5 (ref 5.0–8.0)

## 2018-03-08 LAB — CBC
HCT: 36.4 % (ref 35.0–47.0)
Hemoglobin: 12 g/dL (ref 12.0–16.0)
MCH: 29 pg (ref 26.0–34.0)
MCHC: 33.1 g/dL (ref 32.0–36.0)
MCV: 87.6 fL (ref 80.0–100.0)
PLATELETS: 194 10*3/uL (ref 150–440)
RBC: 4.16 MIL/uL (ref 3.80–5.20)
RDW: 15.4 % — AB (ref 11.5–14.5)
WBC: 4.5 10*3/uL (ref 3.6–11.0)

## 2018-03-08 LAB — LIPID PANEL
CHOL/HDL RATIO: 2.9 ratio
Cholesterol: 164 mg/dL (ref 0–200)
HDL: 57 mg/dL (ref >40)
LDL CALC: 85 mg/dL (ref 0–99)
TRIGLYCERIDES: 110 mg/dL (ref <150)
VLDL: 22 mg/dL (ref 0–40)

## 2018-03-08 LAB — TROPONIN I: TROPONIN I: 0.04 ng/mL — AB (ref <0.03)

## 2018-03-08 LAB — APTT: aPTT: 28 seconds (ref 24–36)

## 2018-03-08 LAB — GLUCOSE, CAPILLARY: GLUCOSE-CAPILLARY: 113 mg/dL — AB (ref 65–99)

## 2018-03-08 LAB — PROTIME-INR
INR: 0.97
PROTHROMBIN TIME: 12.8 s (ref 11.4–15.2)

## 2018-03-08 MED ORDER — PAROXETINE HCL 10 MG PO TABS
10.0000 mg | ORAL_TABLET | Freq: Every day | ORAL | Status: DC
  Administered 2018-03-09: 10 mg via ORAL
  Filled 2018-03-08: qty 1

## 2018-03-08 MED ORDER — SODIUM CHLORIDE 0.9 % IV SOLN
1.0000 g | Freq: Once | INTRAVENOUS | Status: AC
  Administered 2018-03-08: 1 g via INTRAVENOUS
  Filled 2018-03-08: qty 10

## 2018-03-08 MED ORDER — ACETAMINOPHEN 650 MG RE SUPP: 650.0000 mg | Freq: Four times a day (QID) | RECTAL | Status: DC | PRN

## 2018-03-08 MED ORDER — QUETIAPINE FUMARATE 25 MG PO TABS
25.0000 mg | ORAL_TABLET | Freq: Every evening | ORAL | Status: DC | PRN
  Administered 2018-03-09: 01:00:00 25 mg via ORAL
  Filled 2018-03-08: qty 1

## 2018-03-08 MED ORDER — ATORVASTATIN CALCIUM 20 MG PO TABS
40.0000 mg | ORAL_TABLET | Freq: Every day | ORAL | Status: DC
  Administered 2018-03-08: 40 mg via ORAL
  Filled 2018-03-08: qty 2

## 2018-03-08 MED ORDER — ENOXAPARIN SODIUM 30 MG/0.3ML ~~LOC~~ SOLN
30.0000 mg | SUBCUTANEOUS | Status: DC
  Administered 2018-03-08: 30 mg via SUBCUTANEOUS
  Filled 2018-03-08: qty 0.3

## 2018-03-08 MED ORDER — LETROZOLE 2.5 MG PO TABS
2.5000 mg | ORAL_TABLET | Freq: Every day | ORAL | Status: DC
  Administered 2018-03-09: 09:00:00 2.5 mg via ORAL
  Filled 2018-03-08: qty 1

## 2018-03-08 MED ORDER — ASPIRIN 81 MG PO CHEW
81.0000 mg | CHEWABLE_TABLET | Freq: Every day | ORAL | Status: DC
  Administered 2018-03-08 – 2018-03-09 (×2): 81 mg via ORAL
  Filled 2018-03-08: qty 1

## 2018-03-08 MED ORDER — ONDANSETRON HCL 4 MG/2ML IJ SOLN: 4.0000 mg | Freq: Four times a day (QID) | INTRAMUSCULAR | Status: DC | PRN

## 2018-03-08 MED ORDER — HYDRALAZINE HCL 20 MG/ML IJ SOLN
INTRAMUSCULAR | Status: AC
  Administered 2018-03-08: 10 mg via INTRAVENOUS
  Filled 2018-03-08: qty 1

## 2018-03-08 MED ORDER — ONDANSETRON HCL 4 MG PO TABS: 4.0000 mg | ORAL_TABLET | Freq: Four times a day (QID) | ORAL | Status: DC | PRN

## 2018-03-08 MED ORDER — PANTOPRAZOLE SODIUM 40 MG PO TBEC
40.0000 mg | DELAYED_RELEASE_TABLET | Freq: Every day | ORAL | Status: DC
Start: 2018-03-09 — End: 2018-03-09
  Administered 2018-03-09: 40 mg via ORAL
  Filled 2018-03-08: qty 1

## 2018-03-08 MED ORDER — AMLODIPINE BESYLATE 5 MG PO TABS
2.5000 mg | ORAL_TABLET | Freq: Every day | ORAL | Status: DC
  Administered 2018-03-09: 2.5 mg via ORAL
  Filled 2018-03-08: qty 1

## 2018-03-08 MED ORDER — ACETAMINOPHEN 325 MG PO TABS: 650.0000 mg | ORAL_TABLET | Freq: Four times a day (QID) | ORAL | Status: DC | PRN

## 2018-03-08 MED ORDER — POTASSIUM CHLORIDE CRYS ER 20 MEQ PO TBCR
40.0000 meq | EXTENDED_RELEASE_TABLET | Freq: Once | ORAL | Status: AC
  Administered 2018-03-08: 40 meq via ORAL
  Filled 2018-03-08: qty 2

## 2018-03-08 MED ORDER — HYDRALAZINE HCL 20 MG/ML IJ SOLN
10.0000 mg | Freq: Four times a day (QID) | INTRAMUSCULAR | Status: DC | PRN
Start: 2018-03-08 — End: 2018-03-09
  Administered 2018-03-08: 10 mg via INTRAVENOUS

## 2018-03-08 MED ORDER — ASPIRIN 81 MG PO CHEW
CHEWABLE_TABLET | ORAL | Status: AC
  Administered 2018-03-08: 81 mg via ORAL
  Filled 2018-03-08: qty 1

## 2018-03-08 MED ORDER — TRAVOPROST (BAK FREE) 0.004 % OP SOLN
1.0000 [drp] | Freq: Every day | OPHTHALMIC | Status: DC
  Administered 2018-03-08: 1 [drp] via OPHTHALMIC
  Filled 2018-03-08: qty 2.5

## 2018-03-08 MED ORDER — SODIUM CHLORIDE 0.9 % IV SOLN
INTRAVENOUS | Status: DC
  Administered 2018-03-08: 23:00:00 via INTRAVENOUS

## 2018-03-08 MED ORDER — SODIUM CHLORIDE 0.9 % IV SOLN
1.0000 g | INTRAVENOUS | Status: DC
  Filled 2018-03-08: qty 10

## 2018-03-08 NOTE — Progress Notes (Signed)
Chaplain responded to a code stroke. Family was at the bedside. Care team was with the Pt. Pt was responsive. Chaplain prayed with family and for staff. Chaplain let family know he is available as needed,    03/08/18 1400  Clinical Encounter Type  Visited With Patient and family together  Visit Type Initial;Spiritual support  Referral From Nurse  Spiritual Encounters  Spiritual Needs Prayer;Emotional

## 2018-03-08 NOTE — Progress Notes (Signed)
Chaplain was rounding and nurse said she was submitting a request. Chaplain prayed with family and Pt. Pt sons and grands were at bedside.    03/08/18 2100  Clinical Encounter Type  Visited With Patient and family together  Visit Type Follow-up  Referral From Nurse  Spiritual Encounters  Spiritual Needs Prayer

## 2018-03-08 NOTE — ED Triage Notes (Addendum)
Pt arrived via POV with son who reports sudden onset of confusion and dizziness along with left-sided weakness around 1300 today.  Pt is alert to self, son reports pt was unresponsive.

## 2018-03-08 NOTE — Progress Notes (Signed)
   [place] at [facility] Day: 0 days Terry Hendrix is a 84 y.o. female presenting with Code Stroke .   Advance care planning discussed with patient and her sons at bedside. All questions in regards to overall condition and expected prognosis answered. The decision was made to continue current code status  CODE STATUS: Full Code Time spent: 18 minutes

## 2018-03-08 NOTE — ED Triage Notes (Signed)
First Nurse Note:  Arrives with son who reports that today at 72 patient became acutely dizzy, right sided weakness, and became confused.  Patient arrives MAE equally, is alert and oriented to person only.  Speech slurred.  Skin warm and dry

## 2018-03-08 NOTE — Progress Notes (Signed)
CODE STROKE- PHARMACY COMMUNICATION   Time CODE STROKE called/page received:[id number]  Time response to CODE STROKE was made (in person or via phone): Phone Time Stroke Kit retrieved from [place] (only if needed):n/a  Name of Provider/Nurse contacted: [place] CP[facility] med rec , RN unavail   Past Medical History:  Diagnosis Date  . Allergy   . Anemia   . Arthritis   . Atrial fibrillation ([place]) 05/04/2016  . Benign essential hypertension 03/16/2014  . Breast cancer ([place]) 12/2016   Invasive mammary carcinoma  . GIB (gastrointestinal bleeding) 05/04/2016  . Glaucoma   . HLD (hyperlipidemia)   . Hypertension   . MDD (major depressive disorder)    Prior to Admission medications   Medication Sig Start Date End Date Taking? Authorizing Provider  amLODipine (NORVASC) 2.5 MG tablet Take 1 tablet (2.5 mg total) by mouth daily. Take extra 2.5 mg IF your BP is > 140 09/10/17   Fritzi Mandes, MD  atorvastatin (LIPITOR) 40 MG tablet Take 40 mg by mouth at bedtime.    [provider]  letrozole (FEMARA) 2.5 MG tablet Take 1 tablet (2.5 mg total) by mouth daily. 04/05/17   Lloyd Huger, MD  pantoprazole (PROTONIX) 40 MG tablet Take 40 mg by mouth daily.    [provider]  PARoxetine (PAXIL) 10 MG tablet Take 10 mg by mouth daily.    [provider]  travoprost, benzalkonium, (TRAVATAN) 0.004 % ophthalmic solution Place 1 drop into both eyes at bedtime.    [provider]    Thomasenia Sales ,PharmD Clinical Pharmacist  03/08/2018  2:17 PM

## 2018-03-08 NOTE — Progress Notes (Signed)
Pharmacy Antibiotic Note  Terry Hendrix is a 84 y.o. female admitted on 03/08/2018 with UTI.  Pharmacy has been consulted for ceftriaxone dosing.  Plan: ceftriaxone 1gm iv q24h   Height: 5\' 4"  (162.6 cm) Weight: 130 lb 1.6 oz (59 kg) IBW/kg (Calculated) : 54.7  Temp (24hrs), Avg:98.7 F (37.1 C), Min:98.7 F (37.1 C), Max:98.7 F (37.1 C)  Recent Labs  Lab 03/08/18 1409  WBC 4.5  CREATININE 1.83*    Estimated Creatinine Clearance: 18 mL/min (A) (by C-G formula based on SCr of 1.83 mg/dL (H)).    No Known Allergies  Antimicrobials this admission: Anti-infectives (From admission, onward)   Start     Dose/Rate Route Frequency Ordered Stop   03/09/18 1700  cefTRIAXone (ROCEPHIN) 1 g in sodium chloride 0.9 % 100 mL IVPB     1 g 200 mL/hr over 30 Minutes Intravenous Every 24 hours 03/08/18 1745     03/08/18 1700  cefTRIAXone (ROCEPHIN) 1 g in sodium chloride 0.9 % 100 mL IVPB     1 g 200 mL/hr over 30 Minutes Intravenous  Once 03/08/18 1655         Microbiology results: No results found for this or any previous visit (from the past 240 hour(s)).   Thank you for allowing pharmacy to be a part of this patient's care.  Terry Hendrix 03/08/2018 5:45 PM

## 2018-03-08 NOTE — H&P (Signed)
[facility] NAME: Terry Hendrix    MR#:  [id number]  DATE OF BIRTH:  Jun 29, 1927  DATE OF ADMISSION:  03/08/2018  PRIMARY CARE PHYSICIAN: Petra Kuba, MD   REQUESTING/REFERRING PHYSICIAN: Dr. Arta Silence  CHIEF COMPLAINT:   Chief Complaint  Patient presents with  . Code Stroke    HISTORY OF PRESENT ILLNESS:  Terry Hendrix  is a 84 y.o. female with a known history of hypertension, history of breast cancer, dementia, hyperlipidemia brought in by family secondary to an episode of confusion. Patient has mild to moderate dementia, and slow to respond. Most of the history is obtained from her son at bedside. They were actually going home from a funeral today. She was fine when she woke up this morning. Her dementia has been slowly worsening in the last few months. She has waxing and waning mental status. While they were in the car, she suddenly started acting very confused and her speech was different. She doesn't have any slurred speech at this time but is slow to respond.Pleasantly confused. Family says she is close to her baseline. Denies any fevers or chills. No nausea or vomiting. No other complaints. Blood pressure is significantly elevated.  PAST MEDICAL HISTORY:   Past Medical History:  Diagnosis Date  . Allergy   . Anemia   . Arthritis   . Benign essential hypertension 03/16/2014  . Breast cancer ([place]) 12/2016   Invasive mammary carcinoma  . Dementia   . GIB (gastrointestinal bleeding) 05/04/2016  . Glaucoma   . HLD (hyperlipidemia)   . Hypertension   . MDD (major depressive disorder)     PAST SURGICAL HISTORY:   Past Surgical History:  Procedure Laterality Date  . BREAST BIOPSY Left 12/2016   Invasive Mammary Carcinoma  . COLONIC EMBOLIZATION  2014  . EYE SURGERY Bilateral    Cataract Extraction  . LEFT HEART CATH AND CORONARY ANGIOGRAPHY Left 03/13/2017   Procedure: Left Heart Cath and Coronary  Angiography;  Surgeon: Teodoro Spray, MD;  Location: [facility];  Service: Cardiovascular;  Laterality: Left;    SOCIAL HISTORY:   Social History   Tobacco Use  . Smoking status: Never Smoker  . Smokeless tobacco: Never Used  Substance Use Topics  . Alcohol use: No    FAMILY HISTORY:   Family History  Problem Relation Age of Onset  . Breast cancer Mother   . Breast cancer Sister   . Heart disease Sister   . Heart disease Brother   . Colon cancer Neg Hx     DRUG ALLERGIES:  No Known Allergies  REVIEW OF SYSTEMS:   Review of Systems  Constitutional: Positive for malaise/fatigue. Negative for chills, fever and weight loss.  HENT: Negative for ear discharge, ear pain, hearing loss and nosebleeds.   Eyes: Negative for blurred vision, double vision and photophobia.  Respiratory: Negative for cough, hemoptysis, shortness of breath and wheezing.   Cardiovascular: Negative for chest pain, palpitations, orthopnea and leg swelling.  Gastrointestinal: Negative for abdominal pain, constipation, diarrhea, melena, nausea and vomiting.  Genitourinary: Negative for dysuria, frequency and urgency.  Musculoskeletal: Negative for back pain, myalgias and neck pain.  Skin: Negative for rash.  Neurological: Positive for weakness. Negative for dizziness, tingling, tremors, sensory change, speech change, focal weakness and headaches.  Endo/Heme/Allergies: Does not bruise/bleed easily.  Psychiatric/Behavioral: Negative for depression.    MEDICATIONS AT HOME:   Prior to Admission medications  Medication Sig Start Date End Date Taking? Authorizing Provider  amLODipine (NORVASC) 2.5 MG tablet Take 1 tablet (2.5 mg total) by mouth daily. Take extra 2.5 mg IF your BP is > 140 09/10/17  Yes Fritzi Mandes, MD  atorvastatin (LIPITOR) 40 MG tablet Take 40 mg by mouth at bedtime.   Yes [provider]  letrozole (FEMARA) 2.5 MG tablet Take 1 tablet (2.5 mg total) by mouth daily.  04/05/17  Yes Lloyd Huger, MD  pantoprazole (PROTONIX) 40 MG tablet Take 40 mg by mouth daily.   Yes [provider]  PARoxetine (PAXIL) 10 MG tablet Take 10 mg by mouth daily.   Yes [provider]  travoprost, benzalkonium, (TRAVATAN) 0.004 % ophthalmic solution Place 1 drop into both eyes at bedtime.   Yes [provider]      VITAL SIGNS:  Blood pressure (!) 189/115, pulse 76, temperature 98.7 F (37.1 C), temperature source Oral, resp. rate (!) 21, height 5\' 4"  (1.626 m), weight 59 kg (130 lb 1.6 oz), SpO2 100 %.  PHYSICAL EXAMINATION:   Physical Exam  GENERAL:  84 y.o.-year-old patient lying in the bed with no acute distress.  EYES: Pupils equal, round, reactive to light and accommodation. No scleral icterus. Extraocular muscles intact.  HEENT: Head atraumatic, normocephalic. Oropharynx and nasopharynx clear.  NECK:  Supple, no jugular venous distention. No thyroid enlargement, no tenderness.  LUNGS: Normal breath sounds bilaterally, no wheezing, rales,rhonchi or crepitation. No use of accessory muscles of respiration. Decreased bibasilar breath sounds CARDIOVASCULAR: S1, S2 normal. No murmurs, rubs, or gallops.  ABDOMEN: Soft, nontender, nondistended. Bowel sounds present. No organomegaly or mass.  EXTREMITIES: No pedal edema, cyanosis, or clubbing.  NEUROLOGIC: Cranial nerves II through XII are intact. Muscle strength 5/5 in all extremities. Sensation intact. Gait not checked.  PSYCHIATRIC: The patient is alert and oriented x 2. Slow to respond SKIN: No obvious rash, lesion, or ulcer.   LABORATORY PANEL:   CBC Recent Labs  Lab 03/08/18 1409  WBC 4.5  HGB 12.0  HCT 36.4  PLT 194   ------------------------------------------------------------------------------------------------------------------  Chemistries  Recent Labs  Lab 03/08/18 1409  NA 138  K 3.4*  CL 104  CO2 23  GLUCOSE 110*  BUN 30*  CREATININE 1.83*  CALCIUM 8.7*    AST 49*  ALT 30  ALKPHOS 55  BILITOT 0.7   ------------------------------------------------------------------------------------------------------------------  Cardiac Enzymes Recent Labs  Lab 03/08/18 1409  TROPONINI 0.04*   ------------------------------------------------------------------------------------------------------------------  RADIOLOGY:  Ct Head Code Stroke Wo Contrast  Result Date: 03/08/2018 CLINICAL DATA:  Code stroke. Acute dizziness with right-sided weakness EXAM: CT HEAD WITHOUT CONTRAST TECHNIQUE: Contiguous axial images were obtained from the base of the skull through the vertex without intravenous contrast. COMPARISON:  Brain MRI and head CT 09/09/2017 FINDINGS: Brain: No evidence of acute infarction, hemorrhage, hydrocephalus, extra-axial collection or mass lesion/mass effect. Chronic small vessel ischemia that is moderate. Moderate atrophy and ventriculomegaly for age. Vascular: No hyperdense vessel or unexpected calcification. Skull: Normal. Negative for fracture or focal lesion. Sinuses/Orbits: No acute finding. Other: These results were called by telephone at the time of interpretation on 03/08/2018 at 2:27 pm to Dr. Arta Silence , who verbally acknowledged these results. ASPECTS [facility] Stroke Program Early CT Score) -left hemisphere - Ganglionic level infarction (caudate, lentiform nuclei, internal capsule, insula, M1-M3 cortex): 7 - Supraganglionic infarction (M4-M6 cortex): 3 Total score (0-10 with 10 being normal): 10 IMPRESSION: 1. No acute finding or change from 2018. 2. Atrophy and  moderate chronic small vessel ischemia. Electronically Signed   By: Monte Fantasia M.D.   On: 03/08/2018 14:30    EKG:   Orders placed or performed during the hospital encounter of 03/08/18  . ED EKG  . ED EKG    IMPRESSION AND PLAN:   Larenda Reedy  is a 84 y.o. female with a known history of hypertension, history of breast cancer, dementia, hyperlipidemia brought  in by family secondary to an episode of confusion.  1. Altered mental status-TIA versus UTI -Admit,MRI of the brain. CT of the head negative for any acute findings -Gentle hydration, urine cultures and start Rocephin for UTI -Physical therapy consult  2. Hypertension-continue Norvasc. Add hydralazine as needed  3. History of breast cancer-continue letrozole  4. Hyperlipidemia-statin  5. Acute renal insufficiency-secondary to dehydration. Gentle hydration and avoid nephrotoxins  6. DVT prophylaxis-Lovenox    All the records are reviewed and case discussed with ED provider. Management plans discussed with the patient, family and they are in agreement.  CODE STATUS: Full Code  TOTAL TIME TAKING CARE OF THIS PATIENT: 50 minutes.    Montasia Chisenhall M.D on 03/08/2018 at 5:40 PM  Between 7am to 6pm - Pager - [phone]  After 6pm go to [email or web address] - password [email or web address] [facility]  Office  [phone]  CC: Primary care physician; Petra Kuba, MD

## 2018-03-08 NOTE — ED Provider Notes (Signed)
[facility] Emergency Department Provider Note ____________________________________________   First MD Initiated Contact with Patient 03/08/18 1423     (approximate)  I have reviewed the triage vital signs and the nursing notes.   HISTORY  Chief Complaint Code Stroke  History of present illness limited due to dementia and altered mental status  HPI Terry Hendrix is a 84 y.o. female with past medical history as noted below who presents with an episode of altered mental status, acute onset approximately 1 PM today while the patient was being driven by her son to a funeral (and occurring while she was eating a sandwich), consisting of her becoming unresponsive and slumping over.  Since then she seemed somewhat confused and had slurred speech.  There was question of possible right-sided weakness or her slumping over to her right.  She has now more alert but still not back to her baseline.   Past Medical History:  Diagnosis Date  . Allergy   . Anemia   . Arthritis   . Atrial fibrillation ([place]) 05/04/2016  . Benign essential hypertension 03/16/2014  . Breast cancer ([place]) 12/2016   Invasive mammary carcinoma  . GIB (gastrointestinal bleeding) 05/04/2016  . Glaucoma   . HLD (hyperlipidemia)   . Hypertension   . MDD (major depressive disorder)     Patient Active Problem List   Diagnosis Date Noted  . Vertigo 09/09/2017  . Dizziness 09/09/2017  . Aneurysm of thoracic aorta ([place]) 02/14/2017  . AAA (abdominal aortic aneurysm) without rupture ([place]) 02/14/2017  . SOB (shortness of breath) 02/11/2017  . Anemia   . MDD (major depressive disorder)   . Glaucoma   . HLD (hyperlipidemia)   . Hypertension   . Allergy   . Arthritis   . Primary cancer of upper outer quadrant of left female breast ([place]) 01/11/2017  . GIB (gastrointestinal bleeding) 05/04/2016  . Atrial fibrillation ([place]) 05/04/2016  . Right shoulder pain 04/18/2016  . Benign essential  hypertension 03/16/2014    Past Surgical History:  Procedure Laterality Date  . BREAST BIOPSY Left 12/2016   Invasive Mammary Carcinoma  . COLONIC EMBOLIZATION  2014  . EYE SURGERY Bilateral    Cataract Extraction  . LEFT HEART CATH AND CORONARY ANGIOGRAPHY Left 03/13/2017   Procedure: Left Heart Cath and Coronary Angiography;  Surgeon: Teodoro Spray, MD;  Location: [facility];  Service: Cardiovascular;  Laterality: Left;    Prior to Admission medications   Medication Sig Start Date End Date Taking? Authorizing Provider  amLODipine (NORVASC) 2.5 MG tablet Take 1 tablet (2.5 mg total) by mouth daily. Take extra 2.5 mg IF your BP is > 140 09/10/17  Yes Fritzi Mandes, MD  atorvastatin (LIPITOR) 40 MG tablet Take 40 mg by mouth at bedtime.   Yes [provider]  letrozole (FEMARA) 2.5 MG tablet Take 1 tablet (2.5 mg total) by mouth daily. 04/05/17  Yes Lloyd Huger, MD  pantoprazole (PROTONIX) 40 MG tablet Take 40 mg by mouth daily.   Yes [provider]  PARoxetine (PAXIL) 10 MG tablet Take 10 mg by mouth daily.   Yes [provider]  travoprost, benzalkonium, (TRAVATAN) 0.004 % ophthalmic solution Place 1 drop into both eyes at bedtime.   Yes [provider]    Allergies Patient has no known allergies.  Family History  Problem Relation Age of Onset  . Breast cancer Mother   . Breast cancer Sister   . Heart disease Sister   .  Heart disease Brother     Social History Social History   Tobacco Use  . Smoking status: Never Smoker  . Smokeless tobacco: Never Used  Substance Use Topics  . Alcohol use: No  . Drug use: No    Review of Systems Level 5 caveat: Unable to obtain review of systems due to dementia and altered mental status    ____________________________________________   PHYSICAL EXAM:  VITAL SIGNS: ED Triage Vitals  Enc Vitals Group     BP 03/08/18 1426 (!) 173/114     Pulse Rate 03/08/18 1426 82     Resp  03/08/18 1426 (!) 24     Temp 03/08/18 1426 98.7 F (37.1 C)     Temp Source 03/08/18 1426 Oral     SpO2 03/08/18 1426 96 %     Weight 03/08/18 1424 130 lb 1.6 oz (59 kg)     Height 03/08/18 1424 5\' 4"  (1.626 m)     Head Circumference --      Peak Flow --      Pain Score 03/08/18 1423 0     Pain Loc --      Pain Edu? --      Excl. in Agency? --     Constitutional: Alert.  Comfortable appearing. Eyes: Conjunctivae are normal.  EOMI.  PERRLA. Head: Atraumatic. Nose: No congestion/rhinnorhea. Mouth/Throat: Mucous membranes are moist.   Neck: Normal range of motion.  Cardiovascular: Normal rate, regular rhythm. Grossly normal heart sounds.  Good peripheral circulation. Respiratory: Normal respiratory effort.  No retractions. Lungs CTAB. Gastrointestinal: Soft and nontender. No distention.  Genitourinary: No flank tenderness. Musculoskeletal: No lower extremity edema.  Extremities warm and well perfused.  Neurologic: Normal speech.  Cranial nerves III through XII intact.  Normal coordination with no ataxia.  5 out of 5 motor strength and sensation intact to all extremities.  No facial droop. Skin:  Skin is warm and dry. No rash noted. Psychiatric: Calm and cooperative.  ____________________________________________   LABS (all labs ordered are listed, but only abnormal results are displayed)  Labs Reviewed  CBC - Abnormal; Notable for the following components:      Result Value   RDW 15.4 (*)    All other components within normal limits  DIFFERENTIAL - Abnormal; Notable for the following components:   Lymphs Abs 0.9 (*)    All other components within normal limits  COMPREHENSIVE METABOLIC PANEL - Abnormal; Notable for the following components:   Potassium 3.4 (*)    Glucose, Bld 110 (*)    BUN 30 (*)    Creatinine, Ser 1.83 (*)    Calcium 8.7 (*)    Albumin 3.2 (*)    AST 49 (*)    GFR calc non Af Amer 23 (*)    GFR calc Af Amer 27 (*)    All other components within normal  limits  TROPONIN I - Abnormal; Notable for the following components:   Troponin I 0.04 (*)    All other components within normal limits  GLUCOSE, CAPILLARY - Abnormal; Notable for the following components:   Glucose-Capillary 113 (*)    All other components within normal limits  URINALYSIS, COMPLETE (UACMP) WITH MICROSCOPIC - Abnormal; Notable for the following components:   Color, Urine YELLOW (*)    APPearance CLOUDY (*)    Protein, ur 30 (*)    All other components within normal limits  PROTIME-INR  APTT  CBG MONITORING, ED   ____________________________________________  EKG  ED ECG  REPORT I, Arta Silence, the attending physician, personally viewed and interpreted this ECG.  Date: 03/08/2018 EKG Time: 1431 Rate: 82 Rhythm: normal sinus rhythm QRS Axis: normal Intervals: RBBB, LPFB ST/T Wave abnormalities: normal Narrative Interpretation: no evidence of acute ischemia  ____________________________________________  RADIOLOGY  CT head: No ICH or other acute findings  ____________________________________________   PROCEDURES  Procedure(s) performed: No  Procedures  Critical Care performed: No ____________________________________________   INITIAL IMPRESSION / ASSESSMENT AND PLAN / ED COURSE  Pertinent labs & imaging results that were available during my care of the patient were reviewed by me and considered in my medical decision making (see chart for details).  84 year old female with past medical history as noted above presents with acute onset of an episode of unresponsiveness and apparent altered mental status.  She is now more alert but apparently still not back to her baseline.  There was some question of possible right-sided weakness during the episode.  On exam currently, the patient is hypertensive but other vital signs are normal.  She appears mildly confused but is alert and following commands appropriately.  She does not demonstrate any focal  neuro deficits on her exam at this time.  Code stroke was activated from triage but canceled by me given the lack of focal neuro deficit.  Given patient's overall unresponsiveness and then return to alertness, with more global symptoms, differential includes systemic causes of AMS including vasovagal or other syncope, cardiac cause, dehydration or other metabolic etiology, or infection. Less likely TIA. There is no evidence of acute stroke based on current exam.   CT head is negative.   We will obtain basic and cardiac labs, infection workup, and continue to monitor.  If she returns to her baseline mental status and workup is negative, consider discharge home.    ----------------------------------------- 4:53 PM on 03/08/2018 -----------------------------------------  Patient's workup reveals UTI and some AKI with increased creatinine and decreased GFR compared to prior.  She is still not quite back at her baseline.  Given the UTI with AMS and component of AKI, patient is appropriate for admission. ____________________________________________   FINAL CLINICAL IMPRESSION(S) / ED DIAGNOSES  Final diagnoses:  Urinary tract infection without hematuria, site unspecified  Delirium  Acute kidney injury ([place])      NEW MEDICATIONS STARTED DURING THIS VISIT:  New Prescriptions   No medications on file     Note:  This document was prepared using Dragon voice recognition software and may include unintentional dictation errors.     Arta Silence, MD 03/08/18 [phone]

## 2018-03-09 ENCOUNTER — Inpatient Hospital Stay: Payer: Medicare HMO

## 2018-03-09 LAB — BASIC METABOLIC PANEL
Anion gap: 10 (ref 5–15)
BUN: 27 mg/dL — AB (ref 6–20)
CHLORIDE: 109 mmol/L (ref 101–111)
CO2: 23 mmol/L (ref 22–32)
Calcium: 8.3 mg/dL — ABNORMAL LOW (ref 8.9–10.3)
Creatinine, Ser: 1.41 mg/dL — ABNORMAL HIGH (ref 0.44–1.00)
GFR calc Af Amer: 37 mL/min — ABNORMAL LOW (ref >60)
GFR calc non Af Amer: 32 mL/min — ABNORMAL LOW (ref >60)
GLUCOSE: 94 mg/dL (ref 65–99)
POTASSIUM: 3.9 mmol/L (ref 3.5–5.1)
Sodium: 142 mmol/L (ref 135–145)

## 2018-03-09 LAB — CBC
HCT: 32.9 % — ABNORMAL LOW (ref 35.0–47.0)
Hemoglobin: 11.2 g/dL — ABNORMAL LOW (ref 12.0–16.0)
MCH: 29.3 pg (ref 26.0–34.0)
MCHC: 34 g/dL (ref 32.0–36.0)
MCV: 86.1 fL (ref 80.0–100.0)
Platelets: 170 10*3/uL (ref 150–440)
RBC: 3.82 MIL/uL (ref 3.80–5.20)
RDW: 15.1 % — AB (ref 11.5–14.5)
WBC: 4.4 10*3/uL (ref 3.6–11.0)

## 2018-03-09 MED ORDER — QUETIAPINE FUMARATE 25 MG PO TABS
25.0000 mg | ORAL_TABLET | Freq: Once | ORAL | Status: DC
  Filled 2018-03-09: qty 1

## 2018-03-09 MED ORDER — HYDRALAZINE HCL 20 MG/ML IJ SOLN
10.0000 mg | Freq: Four times a day (QID) | INTRAMUSCULAR | Status: DC | PRN
  Administered 2018-03-09: 06:00:00 10 mg via INTRAVENOUS
  Filled 2018-03-09: qty 1

## 2018-03-09 MED ORDER — QUETIAPINE FUMARATE 25 MG PO TABS: 50.0000 mg | ORAL_TABLET | Freq: Every evening | ORAL | Status: DC | PRN

## 2018-03-09 NOTE — Progress Notes (Signed)
Pt is being discharged home. Left a massage for care management weekend voicemail to f/u on Monday regarding [facility] program. Discharge papers given and explained to pt's son, verbalized understanding. Meds and f/u appointments reviewed. No RX at this time.

## 2018-03-09 NOTE — Evaluation (Signed)
Physical Therapy Evaluation Patient Details Name: Lovina Zuver MRN: [id number] DOB: Jul 03, 1927 Today's Date: 03/09/2018   History of Present Illness  Taneika Choi  is a 84 y.o. female with a known history of hypertension, history of breast cancer, dementia, hyperlipidemia brought in by family secondary to an episode of confusion. Admitted for UTI on 3/30.  Patient is a poor historian and required history from son and additional family members present. Will start  PACE program Monday 03/10/18 and reports they are potentially switching from utilizing a cane to a rollater in the future. Was lethargic initially and required multiple attempts to arouse.   Clinical Impression  Patient is a pleasant 84 year old female who was admitted to hospital for UTI. Patient's family present for evaluation. Patient lethargic throughout morning and after multiple attempts to see patient in the morning patient was able to be roused at lunch time. Patient performed bed mobility independently and required use of AD and CGA for ambulation due to fatigue. Patient requires min-mod cueing for task orientation due to hx of dementia. Patient will be participating in PACE program upon d/c.     Follow Up Recommendations Other (comment);Supervision for mobility/OOB(PACE program)    Equipment Recommendations       Recommendations for Other Services       Precautions / Restrictions Precautions Precautions: Fall Precaution Comments: dementia, limited balance Restrictions Weight Bearing Restrictions: No      Mobility  Bed Mobility Overal bed mobility: Modified Independent;Needs Assistance Bed Mobility: Rolling Rolling: Independent         General bed mobility comments: Independent in supine to sit however requires Min A to scoot to top of bed.   Transfers Overall transfer level: Modified independent Equipment used: Standard walker             General transfer comment: Requires CGA and Min-Mod  Cueing for task orientation.   Ambulation/Gait Ambulation/Gait assistance: Min guard Ambulation Distance (Feet): 13 Feet Assistive device: Standard walker Gait Pattern/deviations: Decreased stride length;Shuffle;Trunk flexed   Gait velocity interpretation: Below normal speed for age/gender General Gait Details: Patient ambulates with RW with small length utilizing a shuffling method and excessive trunk flexion. Required Mod A for cueing of hand placement on walker and ambulation direction.   Stairs            Wheelchair Mobility    Modified Rankin (Stroke Patients Only)       Balance Overall balance assessment: Needs assistance Sitting-balance support: Feet supported Sitting balance-Leahy Scale: Fair Sitting balance - Comments: Able to perform tasks within and outside BOS   Standing balance support: Bilateral upper extremity supported;During functional activity Standing balance-Leahy Scale: Fair Standing balance comment: Requires UE supported for standing balance, LE fatigue results in need to sit and decreased standing duration.                              Pertinent Vitals/Pain Pain Assessment: No/denies pain    Home Living Family/patient expects to be discharged to:: Private residence Living Arrangements: Other relatives Available Help at Discharge: Family;Personal care attendant(will attend PACE 03/10/18) Type of Home: House     Entrance Stairs-Number of Steps: 2 Home Layout: Able to live on main level with bedroom/bathroom;Multi-level Home Equipment: Walker - 2 wheels;Cane - single point;Bedside commode;Shower seat;Walker - 4 wheels      Prior Function Level of Independence: Needs assistance   Gait / Transfers Assistance Needed: Ambulates limited community distances  with single point cane. Intermittent use at home but mostly holds to walls/furniture as needed. Will be transitioning to rollater.   ADL's / Homemaking Assistance Needed: assist for  bathing, dressing from [facility], family sets up medications and provides transportation  Comments: will be attending PACE program      Hand Dominance   Dominant Hand: Right    Extremity/Trunk Assessment   Upper Extremity Assessment Upper Extremity Assessment: Generalized weakness    Lower Extremity Assessment Lower Extremity Assessment: Generalized weakness(Generalized 4-/5 strength tested seated EOB )    Cervical / Trunk Assessment Cervical / Trunk Assessment: Normal  Communication   Communication: No difficulties  Cognition Arousal/Alertness: Lethargic(took multiple attempts to evaluate patient) Behavior During Therapy: WFL for tasks assessed/performed Overall Cognitive Status: History of cognitive impairments - at baseline                                 General Comments: History of dementia, can follow simple commands with Min-mod cueing      General Comments      Exercises General Exercises - Lower Extremity Ankle Circles/Pumps: AROM;Both;5 reps;Supine Long Arc Quad: AROM;Both;10 reps;Seated Heel Slides: AROM;Both;5 reps;Supine Other Exercises Other Exercises: sit to stand 5x with cueing for hand placement and posture Other Exercises: standing balance with BUE support and cues for upright posture.    Assessment/Plan    PT Assessment Patient needs continued PT services  PT Problem List Decreased strength;Decreased activity tolerance;Decreased balance;Decreased knowledge of use of DME;Decreased safety awareness       PT Treatment Interventions DME instruction;Gait training;Stair training;Therapeutic exercise;Therapeutic activities;Functional mobility training;Balance training;Neuromuscular re-education;Patient/family education    PT Goals (Current goals can be found in the Care Plan section)  Acute Rehab PT Goals Patient Stated Goal: Patient's family desire to see patient attending PACE program as she is enrolled in it.  PT Goal Formulation: With  family Time For Goal Achievement: 03/23/18 Potential to Achieve Goals: Fair    Frequency Min 2X/week   Barriers to discharge        Co-evaluation               AM-PAC PT "6 Clicks" Daily Activity  Outcome Measure Difficulty turning over in bed (including adjusting bedclothes, sheets and blankets)?: None Difficulty moving from lying on back to sitting on the side of the bed? : None Difficulty sitting down on and standing up from a chair with arms (e.g., wheelchair, bedside commode, etc,.)?: A Little Help needed moving to and from a bed to chair (including a wheelchair)?: A Little Help needed walking in hospital room?: A Little Help needed climbing 3-5 steps with a railing? : A Lot 6 Click Score: 19    End of Session Equipment Utilized During Treatment: Gait belt Activity Tolerance: Patient limited by fatigue;Patient limited by lethargy(patient although awake, continues to be slightly lethargic throughout session fatiguing quickly) Patient left: in bed;with call bell/phone within reach;with bed alarm set;with family/visitor present Nurse Communication: Mobility status PT Visit Diagnosis: Unsteadiness on feet (R26.81);Other abnormalities of gait and mobility (R26.89);Muscle weakness (generalized) (M62.81)    Time: 1230-1300 PT Time Calculation (min) (ACUTE ONLY): 30 min   Charges:   PT Evaluation $PT Eval Low Complexity: 1 Low PT Treatments $Therapeutic Exercise: 8-22 mins   PT G Codes:       Janna Arch, PT, DPT    Janna Arch 03/09/2018, 1:13 PM

## 2018-03-09 NOTE — Discharge Summary (Signed)
[facility] [place] NAME: Terry Hendrix    MR#:  [id number]  DATE OF BIRTH:  Sep 19, 1927  DATE OF ADMISSION:  03/08/2018 ADMITTING PHYSICIAN: Gladstone Lighter, MD  DATE OF DISCHARGE: 03/09/2018  PRIMARY CARE PHYSICIAN: Petra Kuba, MD    ADMISSION DIAGNOSIS:  Delirium [R41.0] Acute kidney injury ([place]) [N17.9] Urinary tract infection without hematuria, site unspecified [N39.0]  DISCHARGE DIAGNOSIS:  Active Problems:   UTI (urinary tract infection)   SECONDARY DIAGNOSIS:   Past Medical History:  Diagnosis Date  . Allergy   . Anemia   . Arthritis   . Benign essential hypertension 03/16/2014  . Breast cancer ([place]) 12/2016   Invasive mammary carcinoma  . Dementia   . GIB (gastrointestinal bleeding) 05/04/2016  . Glaucoma   . HLD (hyperlipidemia)   . Hypertension   . MDD (major depressive disorder)     HOSPITAL COURSE:  84 year old female with a history of dementia and breast cancer who was brought in by family due to confusion.  1.  Acute metabolic encephalopathy in the setting of dehydration and acute kidney injury. Patient did not have UTI.  MRI was performed which did not show evidence of CVA.   2.  Acute kidney injury in the setting of dehydration: Creatinine has responded well to IV fluids.  3.  Essential hypertension: Patient will continue outpatient regimen including Norvasc  4.  History of breast cancer: Continue letrozole  5.  Hyperlipidemia: Continue statin  6.  Alzheimer's dementia   DISCHARGE CONDITIONS AND DIET:   Stable for discharge on regular diet  CONSULTS OBTAINED:    DRUG ALLERGIES:  No Known Allergies  DISCHARGE MEDICATIONS:   Allergies as of 03/09/2018   No Known Allergies     Medication List    TAKE these medications   amLODipine 2.5 MG tablet Commonly known as:  NORVASC Take 1 tablet (2.5 mg total) by mouth daily. Take extra 2.5 mg IF your BP is > 140   atorvastatin 40 MG  tablet Commonly known as:  LIPITOR Take 40 mg by mouth at bedtime.   letrozole 2.5 MG tablet Commonly known as:  FEMARA Take 1 tablet (2.5 mg total) by mouth daily.   pantoprazole 40 MG tablet Commonly known as:  PROTONIX Take 40 mg by mouth daily.   PARoxetine 10 MG tablet Commonly known as:  PAXIL Take 10 mg by mouth daily.   travoprost (benzalkonium) 0.004 % ophthalmic solution Commonly known as:  TRAVATAN Place 1 drop into both eyes at bedtime.         Today   CHIEF COMPLAINT:  Patient received Seroquel last night for agitation family at bedside.   VITAL SIGNS:  Blood pressure 109/68, pulse 86, temperature 98.8 F (37.1 C), temperature source Axillary, resp. rate 18, height 5\' 5"  (1.651 m), weight 58.4 kg (128 lb 12.8 oz), SpO2 98 %.   REVIEW OF SYSTEMS:  Review of Systems  Unable to perform ROS: Dementia     PHYSICAL EXAMINATION:  GENERAL:  84 y.o.-year-old patient lying in the bed with no acute distress.  NECK:  Supple, no jugular venous distention. No thyroid enlargement, no tenderness.  LUNGS: Normal breath sounds bilaterally, no wheezing, rales,rhonchi  No use of accessory muscles of respiration.  CARDIOVASCULAR: S1, S2 normal. No murmurs, rubs, or gallops.  ABDOMEN: Soft, non-tender, non-distended. Bowel sounds present. No organomegaly or mass.  EXTREMITIES: No pedal edema, cyanosis, or clubbing.  PSYCHIATRIC: The patient is with dementia SKIN:  No obvious rash, lesion, or ulcer.   DATA REVIEW:   CBC Recent Labs  Lab 03/09/18 0427  WBC 4.4  HGB 11.2*  HCT 32.9*  PLT 170    Chemistries  Recent Labs  Lab 03/08/18 1409 03/09/18 0427  NA 138 142  K 3.4* 3.9  CL 104 109  CO2 23 23  GLUCOSE 110* 94  BUN 30* 27*  CREATININE 1.83* 1.41*  CALCIUM 8.7* 8.3*  AST 49*  --   ALT 30  --   ALKPHOS 55  --   BILITOT 0.7  --     Cardiac Enzymes Recent Labs  Lab 03/08/18 1409  TROPONINI 0.04*    Microbiology Results   @[email or web address] @  RADIOLOGY:  Ct Head Code Stroke Wo Contrast  Result Date: 03/08/2018 CLINICAL DATA:  Code stroke. Acute dizziness with right-sided weakness EXAM: CT HEAD WITHOUT CONTRAST TECHNIQUE: Contiguous axial images were obtained from the base of the skull through the vertex without intravenous contrast. COMPARISON:  Brain MRI and head CT 09/09/2017 FINDINGS: Brain: No evidence of acute infarction, hemorrhage, hydrocephalus, extra-axial collection or mass lesion/mass effect. Chronic small vessel ischemia that is moderate. Moderate atrophy and ventriculomegaly for age. Vascular: No hyperdense vessel or unexpected calcification. Skull: Normal. Negative for fracture or focal lesion. Sinuses/Orbits: No acute finding. Other: These results were called by telephone at the time of interpretation on 03/08/2018 at 2:27 pm to Dr. Arta Silence , who verbally acknowledged these results. ASPECTS [facility] Stroke Program Early CT Score) -left hemisphere - Ganglionic level infarction (caudate, lentiform nuclei, internal capsule, insula, M1-M3 cortex): 7 - Supraganglionic infarction (M4-M6 cortex): 3 Total score (0-10 with 10 being normal): 10 IMPRESSION: 1. No acute finding or change from 2018. 2. Atrophy and moderate chronic small vessel ischemia. Electronically Signed   By: Monte Fantasia M.D.   On: 03/08/2018 14:30      Allergies as of 03/09/2018   No Known Allergies     Medication List    TAKE these medications   amLODipine 2.5 MG tablet Commonly known as:  NORVASC Take 1 tablet (2.5 mg total) by mouth daily. Take extra 2.5 mg IF your BP is > 140   atorvastatin 40 MG tablet Commonly known as:  LIPITOR Take 40 mg by mouth at bedtime.   letrozole 2.5 MG tablet Commonly known as:  FEMARA Take 1 tablet (2.5 mg total) by mouth daily.   pantoprazole 40 MG tablet Commonly known as:  PROTONIX Take 40 mg by mouth daily.   PARoxetine 10 MG tablet Commonly known as:  PAXIL Take 10 mg by mouth  daily.   travoprost (benzalkonium) 0.004 % ophthalmic solution Commonly known as:  TRAVATAN Place 1 drop into both eyes at bedtime.         Management plans discussed with the patient's son and he is in agreement. Stable for discharge   Patient should follow up with pcp  CODE STATUS:     Code Status Orders  (From admission, onward)        Start     Ordered   03/08/18 2000  Full code  Continuous     03/08/18 1959    Code Status History    Date Active Date Inactive Code Status Order ID Comments User Context   09/09/2017 2226 09/10/2017 2124 Full Code [id number]  Harrie Foreman, MD Inpatient   03/13/2017 1606 03/13/2017 2053 Full Code [id number]  Teodoro Spray, MD Inpatient   05/04/2016 1741 05/10/2016 2034 Full Code [id number]  Bettey Costa, MD Inpatient    Advance Directive Documentation     Most Recent Value  Type of Advance Directive  Healthcare Power of Attorney, Living will  Pre-existing out of facility DNR order (yellow form or pink MOST form)  -  "MOST" Form in Place?  -      TOTAL TIME TAKING CARE OF THIS PATIENT: 38 minutes.    Note: This dictation was prepared with Dragon dictation along with smaller phrase technology. Any transcriptional errors that result from this process are unintentional.  Elsworth Ledin M.D on 03/09/2018 at 11:48 AM  Between 7am to 6pm - Pager - [phone] After 6pm go to [email or web address] - password [email or web address] [facility]  Office  [phone]  CC: Primary care physician; Petra Kuba, MD

## 2018-03-16 NOTE — Progress Notes (Deleted)
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 She is tolerating letrozole well without significant side effects. She was recently admitted to the hospital with hypertensive urgency, but now feels back to her baseline. Currently, patient feels well and is asymptomatic. She has no neurologic complaints. She denies any recent fevers. She has a good appetite and denies weight loss. She has no chest pain or shortness of breath. She denies any nausea, vomiting, constipation, or diarrhea. She has no urinary complaints. Patient offers no specific complaints today.  REVIEW OF SYSTEMS:   Review of Systems  Constitutional: Negative.  Negative for fever, malaise/fatigue and weight loss.  Respiratory: Negative.  Negative for cough and shortness of breath.   Cardiovascular: Negative.  Negative for chest pain and leg swelling.  Gastrointestinal: Negative.  Negative for abdominal pain.  Genitourinary: Negative.   Musculoskeletal: Negative.   Skin: Negative.  Negative for rash.  Neurological: Negative.  Negative for focal weakness, weakness and headaches.  Psychiatric/Behavioral: Negative.  The patient is not nervous/anxious.     As per HPI. Otherwise, a complete review of systems is negative.  PAST MEDICAL HISTORY: Past Medical History:  Diagnosis Date  . Allergy   . Anemia   . Arthritis   . Benign essential hypertension 03/16/2014  . Breast cancer ([place]) 12/2016   Invasive mammary carcinoma  . Dementia   . GIB (gastrointestinal bleeding) 05/04/2016  . Glaucoma   . HLD (hyperlipidemia)   . Hypertension   .  MDD (major depressive disorder)     PAST SURGICAL HISTORY: Past Surgical History:  Procedure Laterality Date  . BREAST BIOPSY Left 12/2016   Invasive Mammary Carcinoma  . COLONIC EMBOLIZATION  2014  . EYE SURGERY Bilateral    Cataract Extraction  . LEFT HEART CATH AND CORONARY ANGIOGRAPHY Left 03/13/2017   Procedure: Left Heart Cath and Coronary Angiography;  Surgeon: Teodoro Spray, MD;  Location: [facility];  Service: Cardiovascular;  Laterality: Left;    FAMILY HISTORY: Family History  Problem Relation Age of Onset  . Breast cancer Mother   . Breast cancer Sister   . Heart disease Sister   . Heart disease Brother   . Colon cancer Neg Hx     ADVANCED DIRECTIVES (Y/N):  N  HEALTH MAINTENANCE: Social History   Tobacco Use  . Smoking status: Never Smoker  . Smokeless tobacco: Never Used  Substance Use Topics  . Alcohol use: No  . Drug use: No     Colonoscopy:  PAP:  Bone density:  Lipid panel:  No Known Allergies  Current Outpatient Medications  Medication Sig Dispense Refill  . amLODipine (NORVASC) 2.5 MG tablet Take 1 tablet (2.5 mg total) by mouth daily. Take extra 2.5 mg IF your BP is > 140 30 tablet 0  . atorvastatin (LIPITOR) 40 MG tablet Take 40 mg by mouth at bedtime.    Marland Kitchen letrozole (FEMARA) 2.5 MG tablet Take 1 tablet (2.5 mg total) by mouth daily. 30 tablet 11  . pantoprazole (PROTONIX) 40 MG tablet Take 40 mg by mouth daily.    Marland Kitchen PARoxetine (PAXIL) 10 MG tablet Take 10 mg by mouth  daily.    . travoprost, benzalkonium, (TRAVATAN) 0.004 % ophthalmic solution Place 1 drop into both eyes at bedtime.     No current facility-administered medications for this visit.     OBJECTIVE: There were no vitals filed for this visit.   There is no height or weight on file to calculate BMI.    ECOG FS:0 - Asymptomatic  General: Well-developed, well-nourished, no acute distress. Eyes: Pink conjunctiva, anicteric sclera. Breasts: Patient again had multiple  family members in the room and requested exam be deferred. Lungs: Clear to auscultation bilaterally. Heart: Regular rate and rhythm. No rubs, murmurs, or gallops. Abdomen: Soft, nontender, nondistended. No organomegaly noted, normoactive bowel sounds. Musculoskeletal: No edema, cyanosis, or clubbing. Neuro: Alert, answering all questions appropriately. Cranial nerves grossly intact. Skin: No rashes or petechiae noted. Psych: Normal affect.    LAB RESULTS:  Lab Results  Component Value Date   NA 142 03/09/2018   K 3.9 03/09/2018   CL 109 03/09/2018   CO2 23 03/09/2018   GLUCOSE 94 03/09/2018   BUN 27 (H) 03/09/2018   CREATININE 1.41 (H) 03/09/2018   CALCIUM 8.3 (L) 03/09/2018   PROT 7.3 03/08/2018   ALBUMIN 3.2 (L) 03/08/2018   AST 49 (H) 03/08/2018   ALT 30 03/08/2018   ALKPHOS 55 03/08/2018   BILITOT 0.7 03/08/2018   GFRNONAA 32 (L) 03/09/2018   GFRAA 37 (L) 03/09/2018    Lab Results  Component Value Date   WBC 4.4 03/09/2018   NEUTROABS 3.0 03/08/2018   HGB 11.2 (L) 03/09/2018   HCT 32.9 (L) 03/09/2018   MCV 86.1 03/09/2018   PLT 170 03/09/2018     STUDIES: Mr Brain Wo Contrast  Result Date: 03/09/2018 CLINICAL DATA:  Motor neuron disease. Episode of confusion. Moderate dementia. EXAM: MRI HEAD WITHOUT CONTRAST TECHNIQUE: Multiplanar, multiecho pulse sequences of the brain and surrounding structures were obtained without intravenous contrast. COMPARISON:  Head CT from yesterday.  Brain MRI 09/09/2017 FINDINGS: Brain: No acute infarction, hemorrhage, hydrocephalus, extra-axial collection or mass effect. Stable mild enlargement of pituitary gland, 1 cm craniocaudal, height likely accentuated by transverse narrowing due to the tortuous cavernous carotids. No mass effect on adjacent structures. This finding and recommendations were reported previously. There is chronic small vessel ischemia in the cerebral white matter, pons, and deep gray nuclei, overall moderate for  age. Atrophy with ventriculomegaly. No specific pattern. Medial temporal volume is relatively well preserved. No evidence of chronic blood products. Vascular: Major flow voids are preserved Skull and upper cervical spine: No evidence of marrow lesion Sinuses/Orbits: Bilateral cataract resection IMPRESSION: 1. No acute finding. 2. Atrophy and moderate chronic small vessel ischemia that is similar to prior. Electronically Signed   By: Monte Fantasia M.D.   On: 03/09/2018 16:03   Ct Head Code Stroke Wo Contrast  Result Date: 03/08/2018 CLINICAL DATA:  Code stroke. Acute dizziness with right-sided weakness EXAM: CT HEAD WITHOUT CONTRAST TECHNIQUE: Contiguous axial images were obtained from the base of the skull through the vertex without intravenous contrast. COMPARISON:  Brain MRI and head CT 09/09/2017 FINDINGS: Brain: No evidence of acute infarction, hemorrhage, hydrocephalus, extra-axial collection or mass lesion/mass effect. Chronic small vessel ischemia that is moderate. Moderate atrophy and ventriculomegaly for age. Vascular: No hyperdense vessel or unexpected calcification. Skull: Normal. Negative for fracture or focal lesion. Sinuses/Orbits: No acute finding. Other: These results were called by telephone at the time of interpretation on 03/08/2018 at 2:27 pm to Dr. Arta Silence , who verbally acknowledged  these results. ASPECTS [facility] Stroke Program Early CT Score) -left hemisphere - Ganglionic level infarction (caudate, lentiform nuclei, internal capsule, insula, M1-M3 cortex): 7 - Supraganglionic infarction (M4-M6 cortex): 3 Total score (0-10 with 10 being normal): 10 IMPRESSION: 1. No acute finding or change from 2018. 2. Atrophy and moderate chronic small vessel ischemia. Electronically Signed   By: Monte Fantasia M.D.   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 Given her advanced age, did not recommend chemotherapy or daily XRT. Patient declined surgical intervention. She has agreed to take letrozole which she will take for a minimum of 5 years until April 2023. Patient's next mammogram will be scheduled for January 2019. Return to clinic in 6 months for further evaluation. 2. Post menopausal: Bone mineral density on Apr 09, 2017 reported a T score of -0.7 which is considered normal. Repeat in May 2020.  Approximately 20 minutes was spent in discussion of which greater than 50% was consultation.  Patient expressed understanding and was in agreement with this plan. She also understands that She can call clinic at any time with any questions, concerns, or complaints.   Cancer Staging Primary cancer of upper outer quadrant of left female breast [facility]) Staging form: Breast, AJCC 8th Edition - Clinical stage from 01/11/2017: Stage IIA (cT2, cN1, cM0, G2, ER: Positive, PR: Positive, HER2: Equivocal) - Signed by Lloyd Huger, MD on 01/11/2017   Lloyd Huger, MD   03/16/2018 9:24 AM

## 2018-03-19 ENCOUNTER — Telehealth: Payer: Self-pay | Admitting: *Deleted

## 2018-03-19 NOTE — Telephone Encounter (Signed)
Cecille Rubin called from [facility] where pt is currently managed. Per Dr. Orpah Melter she will now manage pts letrozole and follow up regarding her breast cancer. They will let our office know if pt needs f/u in the future. All appts have been cancelled at this time.

## 2018-03-21 ENCOUNTER — Ambulatory Visit: Payer: Medicare HMO | Admitting: Oncology

## 2018-06-09 ENCOUNTER — Inpatient Hospital Stay
Admission: EM | Admit: 2018-06-09 | Discharge: 2018-06-11 | DRG: 641 | Disposition: A | Discharge status: Home / Self Care | Payer: No Typology Code available for payment source | Attending: Internal Medicine | Admitting: Internal Medicine

## 2018-06-09 ENCOUNTER — Other Ambulatory Visit: Payer: Self-pay

## 2018-06-09 ENCOUNTER — Encounter: Payer: Self-pay | Admitting: Emergency Medicine

## 2018-06-09 DIAGNOSIS — I10 Essential (primary) hypertension: Secondary | ICD-10-CM | POA: Diagnosis present

## 2018-06-09 DIAGNOSIS — I951 Orthostatic hypotension: Secondary | ICD-10-CM | POA: Diagnosis present

## 2018-06-09 DIAGNOSIS — E785 Hyperlipidemia, unspecified: Secondary | ICD-10-CM | POA: Diagnosis present

## 2018-06-09 DIAGNOSIS — G909 Disorder of the autonomic nervous system, unspecified: Secondary | ICD-10-CM | POA: Diagnosis present

## 2018-06-09 DIAGNOSIS — I4891 Unspecified atrial fibrillation: Secondary | ICD-10-CM | POA: Diagnosis present

## 2018-06-09 DIAGNOSIS — Z9841 Cataract extraction status, right eye: Secondary | ICD-10-CM

## 2018-06-09 DIAGNOSIS — Z803 Family history of malignant neoplasm of breast: Secondary | ICD-10-CM

## 2018-06-09 DIAGNOSIS — I714 Abdominal aortic aneurysm, without rupture: Secondary | ICD-10-CM | POA: Diagnosis present

## 2018-06-09 DIAGNOSIS — H409 Unspecified glaucoma: Secondary | ICD-10-CM | POA: Diagnosis present

## 2018-06-09 DIAGNOSIS — F418 Other specified anxiety disorders: Secondary | ICD-10-CM | POA: Diagnosis present

## 2018-06-09 DIAGNOSIS — E860 Dehydration: Secondary | ICD-10-CM | POA: Diagnosis present

## 2018-06-09 DIAGNOSIS — F0390 Unspecified dementia without behavioral disturbance: Secondary | ICD-10-CM | POA: Diagnosis present

## 2018-06-09 DIAGNOSIS — R55 Syncope and collapse: Secondary | ICD-10-CM | POA: Diagnosis present

## 2018-06-09 DIAGNOSIS — I340 Nonrheumatic mitral (valve) insufficiency: Secondary | ICD-10-CM | POA: Diagnosis present

## 2018-06-09 DIAGNOSIS — Z8249 Family history of ischemic heart disease and other diseases of the circulatory system: Secondary | ICD-10-CM

## 2018-06-09 DIAGNOSIS — I160 Hypertensive urgency: Secondary | ICD-10-CM | POA: Diagnosis present

## 2018-06-09 DIAGNOSIS — Z853 Personal history of malignant neoplasm of breast: Secondary | ICD-10-CM | POA: Diagnosis not present

## 2018-06-09 DIAGNOSIS — Z9842 Cataract extraction status, left eye: Secondary | ICD-10-CM | POA: Diagnosis not present

## 2018-06-09 DIAGNOSIS — N390 Urinary tract infection, site not specified: Secondary | ICD-10-CM | POA: Diagnosis present

## 2018-06-09 LAB — COMPREHENSIVE METABOLIC PANEL
ALK PHOS: 46 U/L (ref 38–126)
ALT: 16 U/L (ref 0–44)
ANION GAP: 9 (ref 5–15)
AST: 44 U/L — ABNORMAL HIGH (ref 15–41)
Albumin: 3.4 g/dL — ABNORMAL LOW (ref 3.5–5.0)
BILIRUBIN TOTAL: 1.2 mg/dL (ref 0.3–1.2)
BUN: 31 mg/dL — ABNORMAL HIGH (ref 8–23)
CALCIUM: 9.6 mg/dL (ref 8.9–10.3)
CHLORIDE: 104 mmol/L (ref 98–111)
CO2: 27 mmol/L (ref 22–32)
Creatinine, Ser: 1.77 mg/dL — ABNORMAL HIGH (ref 0.44–1.00)
GFR calc non Af Amer: 24 mL/min — ABNORMAL LOW (ref >60)
GFR, EST AFRICAN AMERICAN: 28 mL/min — AB (ref >60)
GLUCOSE: 120 mg/dL — AB (ref 70–99)
Potassium: 4.3 mmol/L (ref 3.5–5.1)
Sodium: 140 mmol/L (ref 135–145)
Total Protein: 7.5 g/dL (ref 6.5–8.1)

## 2018-06-09 LAB — URINALYSIS, COMPLETE (UACMP) WITH MICROSCOPIC
BILIRUBIN URINE: NEGATIVE
Bacteria, UA: NONE SEEN
Glucose, UA: NEGATIVE mg/dL
HGB URINE DIPSTICK: NEGATIVE
Ketones, ur: NEGATIVE mg/dL
LEUKOCYTES UA: NEGATIVE
Nitrite: NEGATIVE
PH: 7 (ref 5.0–8.0)
Protein, ur: NEGATIVE mg/dL
SPECIFIC GRAVITY, URINE: 1.01 (ref 1.005–1.030)
Squamous Epithelial / LPF: NONE SEEN (ref 0–5)

## 2018-06-09 LAB — TROPONIN I
TROPONIN I: 0.03 ng/mL — AB (ref <0.03)
Troponin I: <0.03 ng/mL (ref <0.03)
Troponin I: 0.04 ng/mL (ref <0.03)

## 2018-06-09 LAB — CBC
HCT: 35.4 % (ref 35.0–47.0)
Hemoglobin: 11.8 g/dL — ABNORMAL LOW (ref 12.0–16.0)
MCH: 29.1 pg (ref 26.0–34.0)
MCHC: 33.5 g/dL (ref 32.0–36.0)
MCV: 87.1 fL (ref 80.0–100.0)
PLATELETS: 210 10*3/uL (ref 150–440)
RBC: 4.06 MIL/uL (ref 3.80–5.20)
RDW: 15.1 % — ABNORMAL HIGH (ref 11.5–14.5)
WBC: 6.4 10*3/uL (ref 3.6–11.0)

## 2018-06-09 MED ORDER — PAROXETINE HCL 10 MG PO TABS
10.0000 mg | ORAL_TABLET | Freq: Every day | ORAL | Status: DC
  Administered 2018-06-10 – 2018-06-11 (×2): 10 mg via ORAL
  Filled 2018-06-09 (×2): qty 1

## 2018-06-09 MED ORDER — SODIUM CHLORIDE 0.9 % IV SOLN
1000.0000 mL | Freq: Once | INTRAVENOUS | Status: AC
Start: 2018-06-09 — End: 2018-06-09
  Administered 2018-06-09: 1000 mL via INTRAVENOUS

## 2018-06-09 MED ORDER — SODIUM CHLORIDE 0.9 % IV SOLN: 1000.0000 mL | Freq: Once | INTRAVENOUS | Status: DC

## 2018-06-09 MED ORDER — AMLODIPINE BESYLATE 5 MG PO TABS
5.0000 mg | ORAL_TABLET | Freq: Every day | ORAL | Status: DC
  Administered 2018-06-09 – 2018-06-10 (×2): 5 mg via ORAL
  Filled 2018-06-09 (×2): qty 1

## 2018-06-09 MED ORDER — ACETAMINOPHEN 325 MG PO TABS: 650.0000 mg | ORAL_TABLET | Freq: Four times a day (QID) | ORAL | Status: DC | PRN

## 2018-06-09 MED ORDER — SENNOSIDES-DOCUSATE SODIUM 8.6-50 MG PO TABS: 1.0000 | ORAL_TABLET | Freq: Every evening | ORAL | Status: DC | PRN

## 2018-06-09 MED ORDER — ONDANSETRON HCL 4 MG PO TABS: 4.0000 mg | ORAL_TABLET | Freq: Four times a day (QID) | ORAL | Status: DC | PRN

## 2018-06-09 MED ORDER — LATANOPROST 0.005 % OP SOLN
1.0000 [drp] | Freq: Every day | OPHTHALMIC | Status: DC
  Administered 2018-06-09 – 2018-06-10 (×2): 1 [drp] via OPHTHALMIC
  Filled 2018-06-09: qty 2.5

## 2018-06-09 MED ORDER — ONDANSETRON HCL 4 MG/2ML IJ SOLN: 4.0000 mg | Freq: Four times a day (QID) | INTRAMUSCULAR | Status: DC | PRN

## 2018-06-09 MED ORDER — ENOXAPARIN SODIUM 40 MG/0.4ML ~~LOC~~ SOLN: 40.0000 mg | SUBCUTANEOUS | Status: DC

## 2018-06-09 MED ORDER — ACETAMINOPHEN 650 MG RE SUPP: 650.0000 mg | Freq: Four times a day (QID) | RECTAL | Status: DC | PRN

## 2018-06-09 MED ORDER — ATORVASTATIN CALCIUM 20 MG PO TABS
40.0000 mg | ORAL_TABLET | Freq: Every day | ORAL | Status: DC
  Administered 2018-06-10 – 2018-06-11 (×2): 40 mg via ORAL
  Filled 2018-06-09 (×2): qty 2

## 2018-06-09 MED ORDER — SODIUM CHLORIDE 0.9 % IV SOLN
1000.0000 mL | Freq: Once | INTRAVENOUS | Status: AC
  Administered 2018-06-09: 1000 mL via INTRAVENOUS

## 2018-06-09 MED ORDER — ENOXAPARIN SODIUM 30 MG/0.3ML ~~LOC~~ SOLN
30.0000 mg | SUBCUTANEOUS | Status: DC
  Administered 2018-06-09 – 2018-06-10 (×2): 30 mg via SUBCUTANEOUS
  Filled 2018-06-09 (×2): qty 0.3

## 2018-06-09 MED ORDER — HYDRALAZINE HCL 20 MG/ML IJ SOLN
10.0000 mg | INTRAMUSCULAR | Status: DC | PRN
  Administered 2018-06-09 – 2018-06-11 (×3): 10 mg via INTRAVENOUS
  Filled 2018-06-09 (×3): qty 1

## 2018-06-09 MED ORDER — PANTOPRAZOLE SODIUM 40 MG PO TBEC
40.0000 mg | DELAYED_RELEASE_TABLET | Freq: Every day | ORAL | Status: DC
  Administered 2018-06-10 – 2018-06-11 (×2): 40 mg via ORAL
  Filled 2018-06-09 (×2): qty 1

## 2018-06-09 MED ORDER — TRAVOPROST (BAK FREE) 0.004 % OP SOLN
1.0000 [drp] | Freq: Every day | OPHTHALMIC | Status: DC
  Filled 2018-06-09: qty 2.5

## 2018-06-09 MED ORDER — SODIUM CHLORIDE 0.9 % IV SOLN
INTRAVENOUS | Status: DC
  Administered 2018-06-09 – 2018-06-11 (×3): via INTRAVENOUS

## 2018-06-09 NOTE — H&P (Addendum)
[facility] NAME: Terry Hendrix    MR#:  [id number]  DATE OF BIRTH:  [birth year]  DATE OF ADMISSION:  06/09/2018  PRIMARY CARE PHYSICIAN: Petra Kuba, MD   REQUESTING/REFERRING PHYSICIAN:   CHIEF COMPLAINT:   Chief Complaint  Patient presents with  . Weakness    HISTORY OF PRESENT ILLNESS: Terry Hendrix  is a 84 y.o. female with a known history of hypertension, breast cancer, glaucoma, hyperlipidemia presented to the emergency room with near syncopal episode.  Patient felt weak and tired and was able to pass out.  She appears dry and dehydrated.  She was started on IV fluids in the emergency room blood pressure has been significantly elevated in the emergency room.. No complains of any weakness in any part of the body, tingling or numbness.  Urinalysis did not show any infection.  Hospitalist service was consulted for further care.  PAST MEDICAL HISTORY:   Past Medical History:  Diagnosis Date  . Allergy   . Anemia   . Arthritis   . Benign essential hypertension 03/16/2014  . Breast cancer ([place]) 12/2016   Invasive mammary carcinoma  . Dementia   . GIB (gastrointestinal bleeding) 05/04/2016  . Glaucoma   . HLD (hyperlipidemia)   . Hypertension   . MDD (major depressive disorder)     PAST SURGICAL HISTORY:  Past Surgical History:  Procedure Laterality Date  . BREAST BIOPSY Left 12/2016   Invasive Mammary Carcinoma  . COLONIC EMBOLIZATION  2014  . EYE SURGERY Bilateral    Cataract Extraction  . LEFT HEART CATH AND CORONARY ANGIOGRAPHY Left 03/13/2017   Procedure: Left Heart Cath and Coronary Angiography;  Surgeon: Teodoro Spray, MD;  Location: [facility];  Service: Cardiovascular;  Laterality: Left;    SOCIAL HISTORY:  Social History   Tobacco Use  . Smoking status: Never Smoker  . Smokeless tobacco: Never Used  Substance Use Topics  . Alcohol use: No    FAMILY HISTORY:  Family History   Problem Relation Age of Onset  . Breast cancer Mother   . Breast cancer Sister   . Heart disease Sister   . Heart disease Brother   . Colon cancer Neg Hx     DRUG ALLERGIES: No Known Allergies  REVIEW OF SYSTEMS:   CONSTITUTIONAL: No fever,has fatigue and weakness.  EYES: No blurred or double vision.  EARS, NOSE, AND THROAT: No tinnitus or ear pain.  RESPIRATORY: No cough, shortness of breath, wheezing or hemoptysis.  CARDIOVASCULAR: No chest pain, orthopnea, edema.  GASTROINTESTINAL: No nausea, vomiting, diarrhea or abdominal pain.  GENITOURINARY: No dysuria, hematuria.  ENDOCRINE: No polyuria, nocturia,  HEMATOLOGY: No anemia, easy bruising or bleeding SKIN: No rash or lesion. MUSCULOSKELETAL: No joint pain or arthritis.   NEUROLOGIC: No tingling, numbness, weakness.  PSYCHIATRY: No anxiety or depression.   MEDICATIONS AT HOME:  Prior to Admission medications   Medication Sig Start Date End Date Taking? Authorizing Provider  amLODipine (NORVASC) 2.5 MG tablet Take 1 tablet (2.5 mg total) by mouth daily. Take extra 2.5 mg IF your BP is > 140 09/10/17  Yes Fritzi Mandes, MD  atorvastatin (LIPITOR) 40 MG tablet Take 40 mg by mouth daily.    Yes [provider]  letrozole (FEMARA) 2.5 MG tablet Take 1 tablet (2.5 mg total) by mouth daily. 04/05/17  Yes Lloyd Huger, MD  pantoprazole (PROTONIX) 40 MG tablet Take 40 mg by mouth daily.  Yes [provider]  PARoxetine (PAXIL) 10 MG tablet Take 10 mg by mouth daily.   Yes [provider]  travoprost, benzalkonium, (TRAVATAN) 0.004 % ophthalmic solution Place 1 drop into both eyes at bedtime.   Yes [provider]      PHYSICAL EXAMINATION:   VITAL SIGNS: Blood pressure (!) 164/99, pulse 87, temperature 98.4 F (36.9 C), temperature source Oral, resp. rate 18, height 5\' 6"  (1.676 m), weight 61.7 kg (136 lb), SpO2 100 %.  GENERAL:  84 y.o.-year-old patient lying in the bed with no acute  distress.  EYES: Pupils equal, round, reactive to light and accommodation. No scleral icterus. Extraocular muscles intact.  HEENT: Head atraumatic, normocephalic. Oropharynx dry and nasopharynx clear.  NECK:  Supple, no jugular venous distention. No thyroid enlargement, no tenderness.  LUNGS: Normal breath sounds bilaterally, no wheezing, rales,rhonchi or crepitation. No use of accessory muscles of respiration.  CARDIOVASCULAR: S1, S2 normal. No murmurs, rubs, or gallops.  ABDOMEN: Soft, nontender, nondistended. Bowel sounds present. No organomegaly or mass.  EXTREMITIES: No pedal edema, cyanosis, or clubbing.  NEUROLOGIC: Cranial nerves II through XII are intact. Muscle strength 5/5 in all extremities. Sensation intact. Gait not checked.  PSYCHIATRIC: The patient is alert and oriented x 3.  SKIN: No obvious rash, lesion, or ulcer.   LABORATORY PANEL:   CBC Recent Labs  Lab 06/09/18 1009  WBC 6.4  HGB 11.8*  HCT 35.4  PLT 210  MCV 87.1  MCH 29.1  MCHC 33.5  RDW 15.1*   ------------------------------------------------------------------------------------------------------------------  Chemistries  Recent Labs  Lab 06/09/18 1009  NA 140  K 4.3  CL 104  CO2 27  GLUCOSE 120*  BUN 31*  CREATININE 1.77*  CALCIUM 9.6  AST 44*  ALT 16  ALKPHOS 46  BILITOT 1.2   ------------------------------------------------------------------------------------------------------------------ estimated creatinine clearance is 20.2 mL/min (A) (by C-G formula based on SCr of 1.77 mg/dL (H)). ------------------------------------------------------------------------------------------------------------------ No results for input(s): TSH, T4TOTAL, T3FREE, THYROIDAB in the last 72 hours.  Invalid input(s): FREET3   Coagulation profile No results for input(s): INR, PROTIME in the last 168  hours. ------------------------------------------------------------------------------------------------------------------- No results for input(s): DDIMER in the last 72 hours. -------------------------------------------------------------------------------------------------------------------  Cardiac Enzymes Recent Labs  Lab 06/09/18 1009  TROPONINI <0.03   ------------------------------------------------------------------------------------------------------------------ Invalid input(s): POCBNP  ---------------------------------------------------------------------------------------------------------------  Urinalysis    Component Value Date/Time   COLORURINE YELLOW (A) 06/09/2018 1009   APPEARANCEUR CLEAR (A) 06/09/2018 1009   LABSPEC 1.010 06/09/2018 1009   PHURINE 7.0 06/09/2018 1009   GLUCOSEU NEGATIVE 06/09/2018 1009   HGBUR NEGATIVE 06/09/2018 1009   BILIRUBINUR NEGATIVE 06/09/2018 1009   KETONESUR NEGATIVE 06/09/2018 1009   PROTEINUR NEGATIVE 06/09/2018 1009   UROBILINOGEN 1.0 07/08/2010 1301   NITRITE NEGATIVE 06/09/2018 1009   LEUKOCYTESUR NEGATIVE 06/09/2018 1009     RADIOLOGY: No results found.  EKG: Orders placed or performed during the hospital encounter of 06/09/18  . EKG 12-Lead  . EKG 12-Lead  . ED EKG  . ED EKG    IMPRESSION AND PLAN:  84 year old elderly female patient with history of hypertension, hyperlipidemia, glaucoma, breast cancer presented to the emergency room for near syncope.  -Hypertensive urgency Controlled blood pressure with oral Norvasc and PRN IV hydralazine  -Dehydration IV fluids  -Near-syncope Most probably secondary to orthostatic hypotension IV fluids  -Hyperlipidemia Resume statin medication  -DVT prophylaxis subcu Lovenox daily  All the records are reviewed and case discussed with ED provider. Management plans discussed with the patient, family and they  are in agreement.  CODE STATUS:Full code Code Status  History    Date Active Date Inactive Code Status Order ID Comments User Context   03/08/2018 1959 03/09/2018 2155 Full Code [id number]  Gladstone Lighter, MD Inpatient   09/09/2017 2226 09/10/2017 2124 Full Code [id number]  Harrie Foreman, MD Inpatient   03/13/2017 1606 03/13/2017 2053 Full Code [id number]  Teodoro Spray, MD Inpatient   05/04/2016 1741 05/10/2016 2034 Full Code [id number]  Bettey Costa, MD Inpatient       TOTAL TIME TAKING CARE OF THIS PATIENT: 55 minutes.    Saundra Shelling M.D on 06/09/2018 at 5:15 PM  Between 7am to 6pm - Pager - [phone]  After 6pm go to [email or web address] - password [email or web address] [facility]  Office  [phone]  CC: Primary care physician; Petra Kuba, MD

## 2018-06-09 NOTE — ED Notes (Signed)
Per family, patient was seen at [facility] last week and noted to be dehydrated. Patient was given IV fluids. Family states patient has had decreased appetite and decreased fluid intake for a month or more.

## 2018-06-09 NOTE — Progress Notes (Signed)
Anticoagulation monitoring(Lovenox):  84yo  F ordered Lovenox 40 mg Q24h  Filed Weights   06/09/18 1000  Weight: 136 lb (61.7 kg)   BMI 21.96   Lab Results  Component Value Date   CREATININE 1.77 (H) 06/09/2018   CREATININE 1.41 (H) 03/09/2018   CREATININE 1.83 (H) 03/08/2018   Estimated Creatinine Clearance: 20.2 mL/min (A) (by C-G formula based on SCr of 1.77 mg/dL (H)). Hemoglobin & Hematocrit     Component Value Date/Time   HGB 11.8 (L) 06/09/2018 1009   HGB 9.0 (L) 01/30/2013 0420   HCT 35.4 06/09/2018 1009   HCT 27.0 (L) 01/29/2013 0455     Per Protocol for Patient with estCrcl < 30 ml/min and BMI < 40, will transition to Lovenox 30 mg Q24h.      Chinita Greenland PharmD Clinical Pharmacist 06/09/2018

## 2018-06-09 NOTE — Progress Notes (Signed)
Advanced care plan. Purpose of the Encounter: CODE STATUS Parties in Attendance: Patient and family Patient's Decision Capacity: Good Subjective/Patient's story: Presented to the emergency room for near syncope Objective/Medical story Has dehydration, high blood pressure Goals of care determination:  Advance care directives and goals of care discussed with patient and family For now patient and family want everything done which includes cardiac resuscitation, intubation and ventilator if need arises. CODE STATUS: Full code Time spent discussing advanced care planning: 16 minutes

## 2018-06-09 NOTE — ED Provider Notes (Signed)
[facility] Emergency Department Provider Note   ____________________________________________    I have reviewed the triage vital signs and the nursing notes.   HISTORY  Chief Complaint Weakness  History limited by dementia   HPI Terry Hendrix is a 84 y.o. female who presents after a near syncopal episode.  History is limited as the patient has significant dementia.  Upon EMS arrival the patient was alert and oriented and at baseline.  She has no complaints at this time.  Past Medical History:  Diagnosis Date  . Allergy   . Anemia   . Arthritis   . Benign essential hypertension 03/16/2014  . Breast cancer ([place]) 12/2016   Invasive mammary carcinoma  . Dementia   . GIB (gastrointestinal bleeding) 05/04/2016  . Glaucoma   . HLD (hyperlipidemia)   . Hypertension   . MDD (major depressive disorder)     Patient Active Problem List   Diagnosis Date Noted  . UTI (urinary tract infection) 03/08/2018  . Vertigo 09/09/2017  . Dizziness 09/09/2017  . Aneurysm of thoracic aorta ([place]) 02/14/2017  . AAA (abdominal aortic aneurysm) without rupture ([place]) 02/14/2017  . SOB (shortness of breath) 02/11/2017  . Anemia   . MDD (major depressive disorder)   . Glaucoma   . HLD (hyperlipidemia)   . Hypertension   . Allergy   . Arthritis   . Primary cancer of upper outer quadrant of left female breast ([place]) 01/11/2017  . GIB (gastrointestinal bleeding) 05/04/2016  . Atrial fibrillation ([place]) 05/04/2016  . Right shoulder pain 04/18/2016  . Benign essential hypertension 03/16/2014    Past Surgical History:  Procedure Laterality Date  . BREAST BIOPSY Left 12/2016   Invasive Mammary Carcinoma  . COLONIC EMBOLIZATION  2014  . EYE SURGERY Bilateral    Cataract Extraction  . LEFT HEART CATH AND CORONARY ANGIOGRAPHY Left 03/13/2017   Procedure: Left Heart Cath and Coronary Angiography;  Surgeon: Teodoro Spray, MD;  Location: [facility];   Service: Cardiovascular;  Laterality: Left;    Prior to Admission medications   Medication Sig Start Date End Date Taking? Authorizing Provider  amLODipine (NORVASC) 2.5 MG tablet Take 1 tablet (2.5 mg total) by mouth daily. Take extra 2.5 mg IF your BP is > 140 09/10/17  Yes Fritzi Mandes, MD  atorvastatin (LIPITOR) 40 MG tablet Take 40 mg by mouth daily.    Yes [provider]  letrozole (FEMARA) 2.5 MG tablet Take 1 tablet (2.5 mg total) by mouth daily. 04/05/17  Yes Lloyd Huger, MD  pantoprazole (PROTONIX) 40 MG tablet Take 40 mg by mouth daily.   Yes [provider]  PARoxetine (PAXIL) 10 MG tablet Take 10 mg by mouth daily.   Yes [provider]  travoprost, benzalkonium, (TRAVATAN) 0.004 % ophthalmic solution Place 1 drop into both eyes at bedtime.   Yes [provider]     Allergies Patient has no known allergies.  Family History  Problem Relation Age of Onset  . Breast cancer Mother   . Breast cancer Sister   . Heart disease Sister   . Heart disease Brother   . Colon cancer Neg Hx     Social History Social History   Tobacco Use  . Smoking status: Never Smoker  . Smokeless tobacco: Never Used  Substance Use Topics  . Alcohol use: No  . Drug use: No    Review of Systems  Constitutional: No fever/chills Eyes: No visual changes.  ENT: No sore throat. Cardiovascular: Denies chest pain. Respiratory: Denies shortness of breath. Gastrointestinal: No abdominal pain.  .   Genitourinary: Negative for dysuria. Musculoskeletal: Negative for back pain. Skin: Negative for rash. Neurological: Negative for headaches or weakness   ____________________________________________   PHYSICAL EXAM:  VITAL SIGNS: ED Triage Vitals  Enc Vitals Group     BP 06/09/18 0959 (!) 142/97     Pulse Rate 06/09/18 0959 80     Resp 06/09/18 0959 20     Temp 06/09/18 0959 98 F (36.7 C)     Temp Source 06/09/18 0959 Oral     SpO2 06/09/18 0959  97 %     Weight 06/09/18 1000 61.7 kg (136 lb)     Height 06/09/18 1000 1.676 m (5\' 6" )     Head Circumference --      Peak Flow --      Pain Score --      Pain Loc --      Pain Edu? --      Excl. in [place]? --     Constitutional: Alert and oriented. No acute distress. Pleasant and interactive Eyes: Conjunctivae are normal.   Nose: No congestion/rhinnorhea. Mouth/Throat: Mucous membranes are moist.   Neck:  Painless ROM Cardiovascular: Normal rate, regular rhythm. Grossly normal heart sounds.  Good peripheral circulation. Respiratory: Normal respiratory effort.  No retractions. Lungs CTAB. Gastrointestinal: Soft and nontender. No distention.  No CVA tenderness. Genitourinary: deferred Musculoskeletal: No lower extremity tenderness nor edema.  Warm and well perfused Neurologic:  Normal speech and language. No gross focal neurologic deficits are appreciated.  Skin:  Skin is warm, dry and intact. No rash noted. Psychiatric: Mood and affect are normal. Speech and behavior are normal.  ____________________________________________   LABS (all labs ordered are listed, but only abnormal results are displayed)  Labs Reviewed  CBC - Abnormal; Notable for the following components:      Result Value   Hemoglobin 11.8 (*)    RDW 15.1 (*)    All other components within normal limits  URINALYSIS, COMPLETE (UACMP) WITH MICROSCOPIC - Abnormal; Notable for the following components:   Color, Urine YELLOW (*)    APPearance CLEAR (*)    All other components within normal limits  COMPREHENSIVE METABOLIC PANEL - Abnormal; Notable for the following components:   Glucose, Bld 120 (*)    BUN 31 (*)    Creatinine, Ser 1.77 (*)    Albumin 3.4 (*)    AST 44 (*)    GFR calc non Af Amer 24 (*)    GFR calc Af Amer 28 (*)    All other components within normal limits  TROPONIN I   ____________________________________________  EKG  ED ECG REPORT I, Lavonia Drafts, the attending physician, personally  viewed and interpreted this ECG.  Date: 06/09/2018  Rhythm: normal sinus rhythm QRS Axis: normal Intervals: Right bundle branch block ST/T Wave abnormalities: normal Narrative Interpretation: no evidence of acute ischemia  ____________________________________________  RADIOLOGY   ____________________________________________   PROCEDURES  Procedure(s) performed: No  Procedures   Critical Care performed: No ____________________________________________   INITIAL IMPRESSION / ASSESSMENT AND PLAN / ED COURSE  Pertinent labs & imaging results that were available during my care of the patient were reviewed by me and considered in my medical decision making (see chart for details).  Patient well-appearing and in no acute distress.  Appears to be at baseline at this time.  Has a history of dehydration.  Labs are  supportive of dehydration, will give IV fluids and check orthostatics  Contacted by pace they report that she has a urinary tract infection sensitive to Marcus are abnormal, will give an additional liter of fluid and recheck  Patient remains orthostatic after fluids, will admit to the hospital service   ____________________________________________   FINAL CLINICAL IMPRESSION(S) / ED DIAGNOSES  Final diagnoses:  Dehydration        Note:  This document was prepared using Dragon voice recognition software and may include unintentional dictation errors.    Lavonia Drafts, MD 06/09/18 [phone]

## 2018-06-09 NOTE — Progress Notes (Signed)
MD was made aware of pt trop. Level 0.03, no new orders at this time ,  continue to monitor .

## 2018-06-09 NOTE — Plan of Care (Signed)
  Problem: Education: Goal: Knowledge of General Education information will improve Outcome: Progressing   Problem: Health Behavior/Discharge Planning: Goal: Ability to manage health-related needs will improve Outcome: Progressing   Problem: Clinical Measurements: Goal: Will remain free from infection Outcome: Progressing Goal: Diagnostic test results will improve Outcome: Progressing   Problem: Activity: Goal: Risk for activity intolerance will decrease Outcome: Progressing   Problem: Nutrition: Goal: Adequate nutrition will be maintained Outcome: Progressing   Problem: Elimination: Goal: Will not experience complications related to bowel motility Outcome: Progressing Goal: Will not experience complications related to urinary retention Outcome: Progressing   Problem: Pain Managment: Goal: General experience of comfort will improve Outcome: Progressing   Problem: Safety: Goal: Ability to remain free from injury will improve Outcome: Progressing

## 2018-06-09 NOTE — ED Notes (Signed)
Patient assisted to use restroom. Able to ambulate with standby assistance. Steady gait noted.

## 2018-06-09 NOTE — ED Triage Notes (Signed)
Patient from home via [facility]. Per EMS, patient's home health aid states that she was "unconscious" and unable to be aroused. EMS reports when they arrived, patient was alert and talking, answering questions at baseline. Patient with history of dementia. Patient is a [facility] patient. Patient denies any complaints at this time.

## 2018-06-09 NOTE — ED Notes (Signed)
Pt assisted to toilet. This tech moved bed to toilet and pt stood and pivoted and was able to sit and stand with minimal assistance. Pt returned to bed and orthostatics performed. Family informed they could return to room at this time.

## 2018-06-10 ENCOUNTER — Inpatient Hospital Stay
Admit: 2018-06-10 | Discharge: 2018-06-10 | Disposition: A | Discharge status: Home / Self Care | Payer: No Typology Code available for payment source | Attending: Internal Medicine | Admitting: Internal Medicine

## 2018-06-10 LAB — BASIC METABOLIC PANEL
ANION GAP: 10 (ref 5–15)
BUN: 23 mg/dL (ref 8–23)
CO2: 23 mmol/L (ref 22–32)
Calcium: 9.1 mg/dL (ref 8.9–10.3)
Chloride: 105 mmol/L (ref 98–111)
Creatinine, Ser: 1.25 mg/dL — ABNORMAL HIGH (ref 0.44–1.00)
GFR, EST AFRICAN AMERICAN: 43 mL/min — AB (ref >60)
GFR, EST NON AFRICAN AMERICAN: 37 mL/min — AB (ref >60)
Glucose, Bld: 88 mg/dL (ref 70–99)
POTASSIUM: 4.1 mmol/L (ref 3.5–5.1)
Sodium: 138 mmol/L (ref 135–145)

## 2018-06-10 LAB — TROPONIN I: Troponin I: <0.03 ng/mL (ref <0.03)

## 2018-06-10 MED ORDER — ENSURE ENLIVE PO LIQD
237.0000 mL | Freq: Two times a day (BID) | ORAL | Status: DC
  Administered 2018-06-10 – 2018-06-11 (×2): 237 mL via ORAL

## 2018-06-10 MED ORDER — ADULT MULTIVITAMIN W/MINERALS CH
1.0000 | ORAL_TABLET | Freq: Every day | ORAL | Status: DC
  Administered 2018-06-11: 1 via ORAL
  Filled 2018-06-10: qty 1

## 2018-06-10 NOTE — Plan of Care (Signed)
  Problem: Activity: Goal: Risk for activity intolerance will decrease Outcome: Progressing   Problem: Elimination: Goal: Will not experience complications related to bowel motility Outcome: Progressing   Problem: Safety: Goal: Ability to remain free from injury will improve Outcome: Progressing   

## 2018-06-10 NOTE — Progress Notes (Signed)
Dr was made aware of patient trop.level 0.04. No new orders given, will continue to monitor.

## 2018-06-10 NOTE — Progress Notes (Signed)
[place] at [facility]                                                                                                                                                                                  Patient Demographics   Terry Hendrix, is a 84 y.o. female, DOB - 12-06-1927, [id number]  Admit date - 06/09/2018   Admitting Physician Saundra Shelling, MD  Outpatient Primary MD for the patient is Petra Kuba, MD   LOS - 1  Subjective: Patient admitted with near syncope and dizziness Patient's blood pressure is very labile goes up and down.   Review of Systems:   CONSTITUTIONAL: No documented fever. No fatigue, weakness. No weight gain, no weight loss.  EYES: No blurry or double vision.  ENT: No tinnitus. No postnasal drip. No redness of the oropharynx.  RESPIRATORY: No cough, no wheeze, no hemoptysis. No dyspnea.  CARDIOVASCULAR: No chest pain. No orthopnea. No palpitations. No syncope.  GASTROINTESTINAL: No nausea, no vomiting or diarrhea. No abdominal pain. No melena or hematochezia.  GENITOURINARY: No dysuria or hematuria.  ENDOCRINE: No polyuria or nocturia. No heat or cold intolerance.  HEMATOLOGY: No anemia. No bruising. No bleeding.  INTEGUMENTARY: No rashes. No lesions.  MUSCULOSKELETAL: No arthritis. No swelling. No gout.  NEUROLOGIC: No numbness, tingling, or ataxia. No seizure-type activity.  PSYCHIATRIC: No anxiety. No insomnia. No ADD.    Vitals:   Vitals:   06/10/18 1009 06/10/18 1013 06/10/18 1014 06/10/18 1116  BP: 94/77 (!) 76/60 (!) 94/57   Pulse: 78 79 81   Resp:      Temp:      TempSrc:      SpO2:      Weight:    59.3 kg (130 lb 12.8 oz)  Height:        Wt Readings from Last 3 Encounters:  06/10/18 59.3 kg (130 lb 12.8 oz)  03/08/18 58.4 kg (128 lb 12.8 oz)  09/20/17 66.8 kg (147 lb 4.3 oz)     Intake/Output Summary (Last 24 hours) at 06/10/2018 1636 Last data filed at 06/10/2018 1548 Gross per 24 hour  Intake  1427.5 ml  Output 100 ml  Net 1327.5 ml    Physical Exam:   GENERAL: Pleasant-appearing in no apparent distress.  HEAD, EYES, EARS, NOSE AND THROAT: Atraumatic, normocephalic. Extraocular muscles are intact. Pupils equal and reactive to light. Sclerae anicteric. No conjunctival injection. No oro-pharyngeal erythema.  NECK: Supple. There is no jugular venous distention. No bruits, no lymphadenopathy, no thyromegaly.  HEART: Regular rate and rhythm,.  Positive systolic murmurs, no rubs, no clicks.  LUNGS: Clear to auscultation bilaterally. No rales or rhonchi. No  wheezes.  ABDOMEN: Soft, flat, nontender, nondistended. Has good bowel sounds. No hepatosplenomegaly appreciated.  EXTREMITIES: No evidence of any cyanosis, clubbing, or peripheral edema.  +2 pedal and radial pulses bilaterally.  NEUROLOGIC: The patient is alert, awake, and oriented x3 with no focal motor or sensory deficits appreciated bilaterally.  SKIN: Moist and warm with no rashes appreciated.  Psych: Not anxious, depressed LN: No inguinal LN enlargement    Antibiotics   Anti-infectives (From admission, onward)   None      Medications   Scheduled Meds: . atorvastatin  40 mg Oral Daily  . enoxaparin (LOVENOX) injection  30 mg Subcutaneous Q24H  . feeding supplement (ENSURE ENLIVE)  237 mL Oral BID BM  . latanoprost  1 drop Both Eyes QHS  . multivitamin with minerals  1 tablet Oral Daily  . pantoprazole  40 mg Oral Daily  . PARoxetine  10 mg Oral Daily   Continuous Infusions: . sodium chloride 75 mL/hr at 06/09/18 1751   PRN Meds:.acetaminophen **OR** acetaminophen, hydrALAZINE, ondansetron **OR** ondansetron (ZOFRAN) IV, senna-docusate   Data Review:   Micro Results No results found for this or any previous visit (from the past 240 hour(s)).  Radiology Reports No results found.   CBC Recent Labs  Lab 06/09/18 1009  WBC 6.4  HGB 11.8*  HCT 35.4  PLT 210  MCV 87.1  MCH 29.1  MCHC 33.5  RDW 15.1*     Chemistries  Recent Labs  Lab 06/09/18 1009 06/10/18 0901  NA 140 138  K 4.3 4.1  CL 104 105  CO2 27 23  GLUCOSE 120* 88  BUN 31* 23  CREATININE 1.77* 1.25*  CALCIUM 9.6 9.1  AST 44*  --   ALT 16  --   ALKPHOS 46  --   BILITOT 1.2  --    ------------------------------------------------------------------------------------------------------------------ estimated creatinine clearance is 28.6 mL/min (A) (by C-G formula based on SCr of 1.25 mg/dL (H)). ------------------------------------------------------------------------------------------------------------------ No results for input(s): HGBA1C in the last 72 hours. ------------------------------------------------------------------------------------------------------------------ No results for input(s): CHOL, HDL, LDLCALC, TRIG, CHOLHDL, LDLDIRECT in the last 72 hours. ------------------------------------------------------------------------------------------------------------------ No results for input(s): TSH, T4TOTAL, T3FREE, THYROIDAB in the last 72 hours.  Invalid input(s): FREET3 ------------------------------------------------------------------------------------------------------------------ No results for input(s): VITAMINB12, FOLATE, FERRITIN, TIBC, IRON, RETICCTPCT in the last 72 hours.  Coagulation profile No results for input(s): INR, PROTIME in the last 168 hours.  No results for input(s): DDIMER in the last 72 hours.  Cardiac Enzymes Recent Labs  Lab 06/09/18 1747 06/09/18 2317 06/10/18 0901  TROPONINI 0.03* 0.04* <0.03   ------------------------------------------------------------------------------------------------------------------ Invalid input(s): POCBNP    Assessment & Plan   84 year old elderly female patient with history of hypertension, hyperlipidemia, glaucoma, breast cancer presented to the emergency room for near syncope.  -Near-syncope due to very labile blood pressure. We will  obtain echo of the heart has a systolic murmur Follow blood pressure for now I will check a cortisol level  -Continue dehydration IV fluids  -Near-syncope Due to labile blood pressure  -Hyperlipidemia Continue atorvastatin  -History of breast cancer continue with Humira  -Depression anxiety continue Paxil  -Glaucoma continue eyedrops  -DVT prophylaxis subcu Lovenox daily       Code Status Orders  (From admission, onward)        Start     Ordered   06/09/18 1724  Full code  Continuous     06/09/18 1723    Code Status History    Date Active Date Inactive Code Status Order ID Comments  User Context   03/08/2018 1959 03/09/2018 2155 Full Code [id number]  Gladstone Lighter, MD Inpatient   09/09/2017 2226 09/10/2017 2124 Full Code [id number]  Harrie Foreman, MD Inpatient   03/13/2017 1606 03/13/2017 2053 Full Code [id number]  Teodoro Spray, MD Inpatient   05/04/2016 1741 05/10/2016 2034 Full Code [id number]  Bettey Costa, MD Inpatient    Advance Directive Documentation     Most Recent Value  Type of Advance Directive  Healthcare Power of Attorney  Pre-existing out of facility DNR order (yellow form or pink MOST form)  -  "MOST" Form in Place?  -           Consults none  DVT Prophylaxis  Lovenox  Lab Results  Component Value Date   PLT 210 06/09/2018     Time Spent in minutes 35 minutes greater than 50% of time spent in care coordination and counseling patient regarding the condition and plan of care.   Dustin Flock M.D on 06/10/2018 at 4:36 PM  Between 7am to 6pm - Pager - [phone]  After 6pm go to [email or web address] - Proofreader  [employer]   Office  [phone]

## 2018-06-10 NOTE — Progress Notes (Signed)
Initial Nutrition Assessment  DOCUMENTATION CODES:   Not applicable  INTERVENTION:   Ensure Enlive po BID, each supplement provides 350 kcal and 20 grams of protein  Magic cup TID with meals, each supplement provides 290 kcal and 9 grams of protein  MVI daily  Liberalize diet   NUTRITION DIAGNOSIS:   Inadequate oral intake related to acute illness as evidenced by per patient/family report.  GOAL:   Patient will meet greater than or equal to 90% of their needs  MONITOR:   PO intake, Supplement acceptance, Labs, Weight trends, I & O's, Skin  REASON FOR ASSESSMENT:   Malnutrition Screening Tool    ASSESSMENT:   84 y.o. female with a known history of hypertension, breast cancer, glaucoma, dementia, hyperlipidemia presented to the emergency room with near syncopal episode.    Met with pt in room today. Pt is a poor historian but reports decreased appetite and oral intake for several days pta. Pt reports that her appetite is improved today. Pt ate 100% of her breakfast this morning. Pt reports that she does like strawberry or vanilla Ensure but she does not drink them regularly. Per chart, pt appears weight stable since March. Pt denies any trouble swallowing; when asked if she has trouble chewing, she points to a loose tooth. Pt reports that she able to cut up her own meats and declines a mechanical soft diet today. RD will order supplements and liberalize diet.   Medications reviewed and include: lovenox, protonix, NaCl _0 /hr  Labs reviewed:   NUTRITION - FOCUSED PHYSICAL EXAM:    Most Recent Value  Orbital Region  No depletion  Upper Arm Region  No depletion  Thoracic and Lumbar Region  No depletion  Buccal Region  No depletion  Temple Region  Mild depletion  Clavicle Bone Region  Moderate depletion  Clavicle and Acromion Bone Region  Moderate depletion  Scapular Bone Region  Mild depletion  Dorsal Hand  Mild depletion  Patellar Region  Mild depletion  Anterior  Thigh Region  No depletion  Posterior Calf Region  No depletion  Edema (RD Assessment)  Mild  Hair  Reviewed  Eyes  Reviewed  Mouth  Reviewed  Skin  Reviewed  Nails  Reviewed     Diet Order:   Diet Order           Diet Heart Room service appropriate? Yes; Fluid consistency: Thin  Diet effective now         DUCATION NEEDS:   No education needs have been identified at this time  Skin:     Last BM:  7/2- type 5  Height:   Ht Readings from Last 1 Encounters:  06/09/18 _1  (1.676 m)    Weight:   Wt Readings from Last 1 Encounters:  06/10/18 130 lb 12.8 oz (59.3 kg)    Ideal Body Weight:  59 kg  BMI:  Body mass index is 21.11 kg/m.  Estimated Nutritional Needs:   Kcal:  1300-1600kcal/day   Protein:  60-71g/day   Fluid:  >1.5L/day   Koleen Distance MS, RD, LDN Pager #- [phone] Office#- [phone] After Hours Pager: [phone]

## 2018-06-11 ENCOUNTER — Other Ambulatory Visit: Payer: Self-pay

## 2018-06-11 ENCOUNTER — Encounter: Payer: Self-pay | Admitting: Emergency Medicine

## 2018-06-11 ENCOUNTER — Inpatient Hospital Stay
Admission: EM | Admit: 2018-06-11 | Discharge: 2018-06-14 | DRG: 312 | Disposition: A | Discharge status: Skilled Nursing Facility | Payer: No Typology Code available for payment source | Attending: Family Medicine | Admitting: Family Medicine

## 2018-06-11 DIAGNOSIS — Z853 Personal history of malignant neoplasm of breast: Secondary | ICD-10-CM

## 2018-06-11 DIAGNOSIS — H409 Unspecified glaucoma: Secondary | ICD-10-CM | POA: Diagnosis present

## 2018-06-11 DIAGNOSIS — I951 Orthostatic hypotension: Principal | ICD-10-CM | POA: Diagnosis present

## 2018-06-11 DIAGNOSIS — N179 Acute kidney failure, unspecified: Secondary | ICD-10-CM | POA: Diagnosis present

## 2018-06-11 DIAGNOSIS — I959 Hypotension, unspecified: Secondary | ICD-10-CM

## 2018-06-11 DIAGNOSIS — R531 Weakness: Secondary | ICD-10-CM | POA: Diagnosis not present

## 2018-06-11 DIAGNOSIS — I10 Essential (primary) hypertension: Secondary | ICD-10-CM | POA: Diagnosis present

## 2018-06-11 DIAGNOSIS — R55 Syncope and collapse: Secondary | ICD-10-CM | POA: Diagnosis present

## 2018-06-11 DIAGNOSIS — F0390 Unspecified dementia without behavioral disturbance: Secondary | ICD-10-CM | POA: Diagnosis present

## 2018-06-11 DIAGNOSIS — E785 Hyperlipidemia, unspecified: Secondary | ICD-10-CM | POA: Diagnosis present

## 2018-06-11 DIAGNOSIS — Z79899 Other long term (current) drug therapy: Secondary | ICD-10-CM

## 2018-06-11 LAB — TROPONIN I: Troponin I: <0.03 ng/mL (ref <0.03)

## 2018-06-11 LAB — CBC
HEMATOCRIT: 39.3 % (ref 35.0–47.0)
HEMOGLOBIN: 12.9 g/dL (ref 12.0–16.0)
MCH: 29.1 pg (ref 26.0–34.0)
MCHC: 32.7 g/dL (ref 32.0–36.0)
MCV: 89.1 fL (ref 80.0–100.0)
Platelets: 205 10*3/uL (ref 150–440)
RBC: 4.42 MIL/uL (ref 3.80–5.20)
RDW: 15.6 % — AB (ref 11.5–14.5)
WBC: 6.4 10*3/uL (ref 3.6–11.0)

## 2018-06-11 LAB — ECHOCARDIOGRAM COMPLETE
HEIGHTINCHES: 66 in
Weight: 2092.8 oz

## 2018-06-11 LAB — COMPREHENSIVE METABOLIC PANEL
ALBUMIN: 3.3 g/dL — AB (ref 3.5–5.0)
ALK PHOS: 57 U/L (ref 38–126)
ALT: 19 U/L (ref 0–44)
ANION GAP: 10 (ref 5–15)
AST: 37 U/L (ref 15–41)
BILIRUBIN TOTAL: 0.7 mg/dL (ref 0.3–1.2)
BUN: 25 mg/dL — AB (ref 8–23)
CALCIUM: 9.2 mg/dL (ref 8.9–10.3)
CO2: 23 mmol/L (ref 22–32)
Chloride: 104 mmol/L (ref 98–111)
Creatinine, Ser: 1.27 mg/dL — ABNORMAL HIGH (ref 0.44–1.00)
GFR calc Af Amer: 42 mL/min — ABNORMAL LOW (ref >60)
GFR calc non Af Amer: 36 mL/min — ABNORMAL LOW (ref >60)
GLUCOSE: 119 mg/dL — AB (ref 70–99)
POTASSIUM: 4.4 mmol/L (ref 3.5–5.1)
Sodium: 137 mmol/L (ref 135–145)
Total Protein: 7.3 g/dL (ref 6.5–8.1)

## 2018-06-11 MED ORDER — AMLODIPINE BESYLATE 5 MG PO TABS: 2.5000 mg | ORAL_TABLET | Freq: Every day | ORAL | Status: DC

## 2018-06-11 MED ORDER — SODIUM CHLORIDE 0.9 % IV BOLUS
500.0000 mL | Freq: Once | INTRAVENOUS | Status: AC
  Administered 2018-06-11: 500 mL via INTRAVENOUS

## 2018-06-11 NOTE — Progress Notes (Signed)
Pt discharged home today with son, [facility] has delivered wheelchair for patient, and pt taken down in said wheelchair, pt will be followed by [facility] and has no complaints at this time.

## 2018-06-11 NOTE — ED Triage Notes (Signed)
Pt d/c from hosp at [place]; upon arrival to home BP 59/47 and had near-syncopal episode; dx with UTI and dehydration; pt denies c/o at present, hx of dementia, accomp by family

## 2018-06-11 NOTE — Plan of Care (Signed)
  Problem: Education: Goal: Knowledge of General Education information will improve Outcome: Progressing   Problem: Health Behavior/Discharge Planning: Goal: Ability to manage health-related needs will improve Outcome: Progressing   Problem: Clinical Measurements: Goal: Will remain free from infection Outcome: Progressing Goal: Diagnostic test results will improve Outcome: Progressing   Problem: Activity: Goal: Risk for activity intolerance will decrease Outcome: Progressing   Problem: Nutrition: Goal: Adequate nutrition will be maintained Outcome: Progressing   Problem: Elimination: Goal: Will not experience complications related to bowel motility Outcome: Progressing Goal: Will not experience complications related to urinary retention Outcome: Progressing   Problem: Pain Managment: Goal: General experience of comfort will improve Outcome: Progressing   Problem: Safety: Goal: Ability to remain free from injury will improve Outcome: Progressing

## 2018-06-11 NOTE — ED Provider Notes (Signed)
[facility] Emergency Department Provider Note  Time seen: 9:23 PM  I have reviewed the triage vital signs and the nursing notes.   HISTORY  Chief Complaint Near Syncope    HPI Terry Hendrix is a 84 y.o. female with a past medical history of dementia, anemia, arthritis, hyperlipidemia, depression, hypertension, presents to the emergency department for near syncopal episode and low blood pressure.  According to the son the patient was discharged from the hospital today at 5 PM.  He picked the patient up and brought her home.  States upon getting to the house he attempted to get the patient out of the car and she was slumped forwards, not responding to him.  States he was able to get her into a wheelchair and into the house.  Checked her blood pressure and it was 50s over 40s.  Waited approximately 15 minutes checked again it was 82s over 37s.  States he called EMS, however when they arrived the patient was starting to come back around and her blood pressure did increase to 115.  They attempted to stand the patient she became very lightheaded and dizzy once again took her blood pressure and it was 90 systolic, so they transported the patient to the emergency department for further evaluation.  Here the patient denies any complaints, does have dementia at baseline.  Past Medical History:  Diagnosis Date  . Allergy   . Anemia   . Arthritis   . Benign essential hypertension 03/16/2014  . Breast cancer ([place]) 12/2016   Invasive mammary carcinoma  . Dementia   . GIB (gastrointestinal bleeding) 05/04/2016  . Glaucoma   . HLD (hyperlipidemia)   . Hypertension   . MDD (major depressive disorder)     Patient Active Problem List   Diagnosis Date Noted  . Near syncope 06/09/2018  . UTI (urinary tract infection) 03/08/2018  . Vertigo 09/09/2017  . Dizziness 09/09/2017  . Aneurysm of thoracic aorta ([place]) 02/14/2017  . AAA (abdominal aortic aneurysm) without rupture  ([place]) 02/14/2017  . SOB (shortness of breath) 02/11/2017  . Anemia   . MDD (major depressive disorder)   . Glaucoma   . HLD (hyperlipidemia)   . Hypertension   . Allergy   . Arthritis   . Primary cancer of upper outer quadrant of left female breast ([place]) 01/11/2017  . GIB (gastrointestinal bleeding) 05/04/2016  . Atrial fibrillation ([place]) 05/04/2016  . Right shoulder pain 04/18/2016  . Benign essential hypertension 03/16/2014    Past Surgical History:  Procedure Laterality Date  . BREAST BIOPSY Left 12/2016   Invasive Mammary Carcinoma  . COLONIC EMBOLIZATION  2014  . EYE SURGERY Bilateral    Cataract Extraction  . LEFT HEART CATH AND CORONARY ANGIOGRAPHY Left 03/13/2017   Procedure: Left Heart Cath and Coronary Angiography;  Surgeon: Teodoro Spray, MD;  Location: [place];  Service: Cardiovascular;  Laterality: Left;    Prior to Admission medications   Medication Sig Start Date End Date Taking? Authorizing Provider  amLODipine (NORVASC) 2.5 MG tablet Take 1 tablet (2.5 mg total) by mouth daily. Take extra 2.5 mg IF your BP is > 140 09/10/17   Fritzi Mandes, MD  atorvastatin (LIPITOR) 40 MG tablet Take 40 mg by mouth daily.     [provider]  letrozole (FEMARA) 2.5 MG tablet Take 1 tablet (2.5 mg total) by mouth daily. 04/05/17   Lloyd Huger, MD  pantoprazole (PROTONIX) 40 MG tablet Take 40 mg  by mouth daily.    [provider]  PARoxetine (PAXIL) 10 MG tablet Take 10 mg by mouth daily.    [provider]  travoprost, benzalkonium, (TRAVATAN) 0.004 % ophthalmic solution Place 1 drop into both eyes at bedtime.    [provider]    No Known Allergies  Family History  Problem Relation Age of Onset  . Breast cancer Mother   . Breast cancer Sister   . Heart disease Sister   . Heart disease Brother   . Colon cancer Neg Hx     Social History Social History   Tobacco Use  . Smoking status: Never Smoker  . Smokeless  tobacco: Never Used  Substance Use Topics  . Alcohol use: No  . Drug use: No    Review of Systems Unable to obtain an adequate/accurate review of systems secondary to dementia. ____________________________________________   PHYSICAL EXAM:  VITAL SIGNS: ED Triage Vitals  Enc Vitals Group     BP 06/11/18 2023 105/71     Pulse Rate 06/11/18 2023 80     Resp 06/11/18 2023 18     Temp 06/11/18 2023 98.5 F (36.9 C)     Temp Source 06/11/18 2023 Oral     SpO2 06/11/18 2023 97 %     Weight 06/11/18 2025 129 lb (58.5 kg)     Height 06/11/18 2025 5\' 6"  (1.676 m)     Head Circumference --      Peak Flow --      Pain Score 06/11/18 2025 0     Pain Loc --      Pain Edu? --      Excl. in [place]? --    Constitutional: Alert, no distress, no complaints at this time. Eyes: Normal exam ENT   Head: Normocephalic and atraumatic.   Mouth/Throat: Mucous membranes are moist. Cardiovascular: Normal rate, regular rhythm. No murmur Respiratory: Normal respiratory effort without tachypnea nor retractions. Breath sounds are clear  Gastrointestinal: Soft and nontender. No distention.  Musculoskeletal: Nontender with normal range of motion in all extremities.  Neurologic:  Normal speech and language. No gross focal neurologic deficits Skin:  Skin is warm, dry and intact.  Psychiatric: Mood and affect are normal.   ____________________________________________    EKG  EKG reviewed and interpreted by myself shows sinus rhythm at 71 bpm with a slightly widened QRS, normal axis, slight QTC prolongation, nonspecific ST changes.  No ST elevation.  ____________________________________________   INITIAL IMPRESSION / ASSESSMENT AND PLAN / ED COURSE  Pertinent labs & imaging results that were available during my care of the patient were reviewed by me and considered in my medical decision making (see chart for details).  She presents to the emergency department after an episode of  unresponsiveness in which patient's blood pressure was in the 32I systolic slowly increased to 115 by the time EMS arrived.  Differential would include hypotension, vasovagal syncope, orthostatic syncope, dehydration, electrolyte or metabolic abnormality, ACS.  We will check labs, IV hydrate, EKG as well as cardiac panel.  We will continue to closely monitor the patient in the emergency department.  Reviewed the patient's records including her discharge summary from today.  Patient was admitted to the hospital for near syncope with autonomic dysfunction and labile blood pressures, very consistent with today's presentation.  It appears at that time the patient's physician was involved and believe she can be adequately monitored at home.  Son states the pace physician was involved tonight and believe  she needed to be admitted to the hospital.  Patient's labs have resulted largely at baseline.  Troponin negative.  Given the patient's continued hypotension and near syncopal episodes at home we will admit the patient to the hospitalist service. ____________________________________________   FINAL CLINICAL IMPRESSION(S) / ED DIAGNOSES  Near syncope Hypotension    Harvest Dark, MD 06/11/18 2239

## 2018-06-11 NOTE — Progress Notes (Signed)
Error

## 2018-06-11 NOTE — Discharge Summary (Signed)
Terry Hendrix at [facility] 84 y.o., DOB [birth year], MRN [id number]. Admission date: 06/09/2018 Discharge Date 06/11/2018 Primary MD Petra Kuba, MD Admitting Physician Saundra Shelling, MD  Admission Diagnosis  Dehydration [E86.0]  Discharge Diagnosis   Active Problems:   Near syncope due to autonomic dysfunction with labile blood pressure Positive murmur results of the echocardiogram currently pending Hyperlipidemia History of breast cancer Depression Glaucoma Dementia   Hospital Course 84 year old elderly female patient with history of hypertension, hyperlipidemia, glaucoma, breast cancer presented to the emergency room for near syncope.  Patient was admitted to the hospital her blood pressure was very labile.  I received a call from her pace MD who states that her blood pressure has been like that for a long time.  And feels that patient has autonomic dysfunction.  She recommended that patient be discharged they will be addressing her blood pressure issues as outpatient.  She also had a murmur therefore underwent echo showed mild aortic and mitral regurgitation.. Patient currently stable to be discharged home.             Consults  None  Significant Tests:  See full reports for all details     No results found.     Today   Subjective:   Terry Hendrix patient back to baseline blood pressure is labile and fluctuating Objective:   Blood pressure 98/68, pulse 81, temperature 98.2 F (36.8 C), temperature source Oral, resp. rate 16, height 5\' 6"  (1.676 m), weight 58.8 kg (129 lb 11.2 oz), SpO2 99 %.  .  Intake/Output Summary (Last 24 hours) at 06/11/2018 1445 Last data filed at 06/11/2018 1410 Gross per 24 hour  Intake 1646.25 ml  Output 400 ml  Net 1246.25 ml    Exam VITAL SIGNS: Blood pressure 98/68, pulse 81, temperature 98.2 F (36.8 C), temperature source Oral, resp. rate 16, height 5\' 6"  (1.676 m), weight  58.8 kg (129 lb 11.2 oz), SpO2 99 %.  GENERAL:  84 y.o.-year-old patient lying in the bed with no acute distress.  EYES: Pupils equal, round, reactive to light and accommodation. No scleral icterus. Extraocular muscles intact.  HEENT: Head atraumatic, normocephalic. Oropharynx and nasopharynx clear.  NECK:  Supple, no jugular venous distention. No thyroid enlargement, no tenderness.  LUNGS: Normal breath sounds bilaterally, no wheezing, rales,rhonchi or crepitation. No use of accessory muscles of respiration.  CARDIOVASCULAR: S1, S2 normal. No murmurs, rubs, or gallops.  ABDOMEN: Soft, nontender, nondistended. Bowel sounds present. No organomegaly or mass.  EXTREMITIES: No pedal edema, cyanosis, or clubbing.  NEUROLOGIC: Cranial nerves II through XII are intact. Muscle strength 5/5 in all extremities. Sensation intact. Gait not checked.  PSYCHIATRIC: The patient is alert and oriented x 3.  SKIN: No obvious rash, lesion, or ulcer.   Data Review     CBC w Diff:  Lab Results  Component Value Date   WBC 6.4 06/09/2018   HGB 11.8 (L) 06/09/2018   HGB 9.0 (L) 01/30/2013   HCT 35.4 06/09/2018   HCT 27.0 (L) 01/29/2013   PLT 210 06/09/2018   PLT 197 01/29/2013   LYMPHOPCT 21 03/08/2018   LYMPHOPCT 16.4 01/29/2013   MONOPCT 12 03/08/2018   MONOPCT 12.4 01/29/2013   EOSPCT 1 03/08/2018   EOSPCT 3.9 01/29/2013   BASOPCT 1 03/08/2018   BASOPCT 0.4 01/29/2013   CMP:  Lab Results  Component Value Date   NA 138 06/10/2018   NA 144 01/29/2013   K 4.1  06/10/2018   K 3.2 (L) 01/29/2013   CL 105 06/10/2018   CL 112 (H) 01/29/2013   CO2 23 06/10/2018   CO2 24 01/29/2013   BUN 23 06/10/2018   BUN 5 (L) 01/29/2013   CREATININE 1.25 (H) 06/10/2018   CREATININE 0.84 01/29/2013   PROT 7.5 06/09/2018   PROT 7.6 01/25/2013   ALBUMIN 3.4 (L) 06/09/2018   ALBUMIN 3.3 (L) 01/25/2013   BILITOT 1.2 06/09/2018   BILITOT 0.6 01/25/2013   ALKPHOS 46 06/09/2018   ALKPHOS 52 01/25/2013   AST  44 (H) 06/09/2018   AST 18 01/25/2013   ALT 16 06/09/2018   ALT 13 01/25/2013  .  Micro Results No results found for this or any previous visit (from the past 240 hour(s)).      Code Status Orders  (From admission, onward)        Start     Ordered   06/09/18 1724  Full code  Continuous     06/09/18 1723    Code Status History    Date Active Date Inactive Code Status Order ID Comments User Context   03/08/2018 1959 03/09/2018 2155 Full Code [id number]  Gladstone Lighter, MD Inpatient   09/09/2017 2226 09/10/2017 2124 Full Code [id number]  Harrie Foreman, MD Inpatient   03/13/2017 1606 03/13/2017 2053 Full Code [id number]  Teodoro Spray, MD Inpatient   05/04/2016 1741 05/10/2016 2034 Full Code [id number]  Bettey Costa, MD Inpatient    Advance Directive Documentation     Most Recent Value  Type of Advance Directive  Healthcare Power of [place]  Pre-existing out of facility DNR order (yellow form or pink MOST form)  -  "MOST" Form in Place?  -          [place] In 3 days.   Why:  WILL FOLLOW UP WITH YOU AT THE FACILITY. Thanks! Contact information: [place] [address] [phone]           Discharge Medications   Allergies as of 06/11/2018   No Known Allergies     Medication List    TAKE these medications   amLODipine 2.5 MG tablet Commonly known as:  NORVASC Take 1 tablet (2.5 mg total) by mouth daily. Take extra 2.5 mg IF your BP is > 140   atorvastatin 40 MG tablet Commonly known as:  LIPITOR Take 40 mg by mouth daily.   letrozole 2.5 MG tablet Commonly known as:  FEMARA Take 1 tablet (2.5 mg total) by mouth daily.   pantoprazole 40 MG tablet Commonly known as:  PROTONIX Take 40 mg by mouth daily.   PARoxetine 10 MG tablet Commonly known as:  PAXIL Take 10 mg by mouth daily.   travoprost (benzalkonium) 0.004 % ophthalmic solution Commonly known as:  TRAVATAN Place 1 drop into both  eyes at bedtime.          Total Time in preparing paper work, data evaluation and todays exam - 49 minutes  Dustin Flock M.D on 06/11/2018 at 2:45 PM [place]  [phone]

## 2018-06-11 NOTE — Care Management Note (Signed)
Case Management Note  Patient Details  Name: [name] MRN: [id number] Date of Birth: 06/13/1927  Subjective/Objective:    Admitted to [facility] with the diagnosis of near syncope. From home. Cousin lives in the home. Sons are Tim [phone]) and Tamala Bari [phone]).  Sees Dr. Alessandra Grout as primary care physician.  A [facility] years. States she [facility] to [facility] on Thursday and Friday,  [facility] will transport if Ms. Shill is discharged before 2:00pm               Action/Plan: Nurse updated Robin at [facility] this morning.   Expected Discharge Date:                  Expected Discharge Plan:     In-House Referral:     Discharge planning Services     Post Acute Care Choice:    Choice offered to:     DME Arranged:    DME Agency:     HH Arranged:    HH Agency:     Status of Service:     If discussed at [employer], dates discussed:    Additional Comments:  Shelbie Ammons, RN MSN CCM Care Management [phone] 06/11/2018, 9:22 AM

## 2018-06-12 DIAGNOSIS — R55 Syncope and collapse: Secondary | ICD-10-CM | POA: Diagnosis present

## 2018-06-12 LAB — GLUCOSE, CAPILLARY
GLUCOSE-CAPILLARY: 111 mg/dL — AB (ref 70–99)
Glucose-Capillary: 78 mg/dL (ref 70–99)
Glucose-Capillary: 114 mg/dL — ABNORMAL HIGH (ref 70–99)

## 2018-06-12 LAB — BASIC METABOLIC PANEL
Anion gap: 7 (ref 5–15)
BUN: 22 mg/dL (ref 8–23)
CHLORIDE: 108 mmol/L (ref 98–111)
CO2: 23 mmol/L (ref 22–32)
Calcium: 8.9 mg/dL (ref 8.9–10.3)
Creatinine, Ser: 1.04 mg/dL — ABNORMAL HIGH (ref 0.44–1.00)
GFR calc non Af Amer: 46 mL/min — ABNORMAL LOW (ref >60)
GFR, EST AFRICAN AMERICAN: 54 mL/min — AB (ref >60)
Glucose, Bld: 87 mg/dL (ref 70–99)
POTASSIUM: 3.9 mmol/L (ref 3.5–5.1)
SODIUM: 138 mmol/L (ref 135–145)

## 2018-06-12 LAB — CBC
HEMATOCRIT: 35 % (ref 35.0–47.0)
Hemoglobin: 12.1 g/dL (ref 12.0–16.0)
MCH: 30 pg (ref 26.0–34.0)
MCHC: 34.5 g/dL (ref 32.0–36.0)
MCV: 87.1 fL (ref 80.0–100.0)
Platelets: 185 10*3/uL (ref 150–440)
RBC: 4.02 MIL/uL (ref 3.80–5.20)
RDW: 15.4 % — ABNORMAL HIGH (ref 11.5–14.5)
WBC: 4.9 10*3/uL (ref 3.6–11.0)

## 2018-06-12 MED ORDER — PAROXETINE HCL 10 MG PO TABS
10.0000 mg | ORAL_TABLET | Freq: Every day | ORAL | Status: DC
  Administered 2018-06-12 – 2018-06-14 (×3): 10 mg via ORAL
  Filled 2018-06-12 (×3): qty 1

## 2018-06-12 MED ORDER — DOCUSATE SODIUM 100 MG PO CAPS
100.0000 mg | ORAL_CAPSULE | Freq: Two times a day (BID) | ORAL | Status: DC
  Administered 2018-06-12 – 2018-06-14 (×5): 100 mg via ORAL
  Filled 2018-06-12 (×6): qty 1

## 2018-06-12 MED ORDER — TRAVOPROST (BAK FREE) 0.004 % OP SOLN
1.0000 [drp] | Freq: Every day | OPHTHALMIC | Status: DC
  Administered 2018-06-12 – 2018-06-13 (×2): 1 [drp] via OPHTHALMIC
  Filled 2018-06-12: qty 2.5

## 2018-06-12 MED ORDER — CARVEDILOL 6.25 MG PO TABS
3.1250 mg | ORAL_TABLET | Freq: Two times a day (BID) | ORAL | Status: DC
  Administered 2018-06-12: 3.125 mg via ORAL
  Filled 2018-06-12: qty 1

## 2018-06-12 MED ORDER — LETROZOLE 2.5 MG PO TABS
2.5000 mg | ORAL_TABLET | Freq: Every day | ORAL | Status: DC
  Administered 2018-06-12 – 2018-06-14 (×3): 2.5 mg via ORAL
  Filled 2018-06-12 (×3): qty 1

## 2018-06-12 MED ORDER — ONDANSETRON HCL 4 MG PO TABS: 4.0000 mg | ORAL_TABLET | Freq: Four times a day (QID) | ORAL | Status: DC | PRN

## 2018-06-12 MED ORDER — ACETAMINOPHEN 650 MG RE SUPP: 650.0000 mg | Freq: Four times a day (QID) | RECTAL | Status: DC | PRN

## 2018-06-12 MED ORDER — BISACODYL 5 MG PO TBEC: 5.0000 mg | DELAYED_RELEASE_TABLET | Freq: Every day | ORAL | Status: DC | PRN

## 2018-06-12 MED ORDER — METOPROLOL TARTRATE 5 MG/5ML IV SOLN
2.5000 mg | Freq: Once | INTRAVENOUS | Status: AC
  Administered 2018-06-12: 2.5 mg via INTRAVENOUS

## 2018-06-12 MED ORDER — TRAZODONE HCL 50 MG PO TABS: 25.0000 mg | ORAL_TABLET | Freq: Every evening | ORAL | Status: DC | PRN

## 2018-06-12 MED ORDER — METOPROLOL TARTRATE 5 MG/5ML IV SOLN
INTRAVENOUS | Status: AC
  Filled 2018-06-12: qty 5

## 2018-06-12 MED ORDER — ONDANSETRON HCL 4 MG/2ML IJ SOLN: 4.0000 mg | Freq: Four times a day (QID) | INTRAMUSCULAR | Status: DC | PRN

## 2018-06-12 MED ORDER — AMLODIPINE BESYLATE 5 MG PO TABS
2.5000 mg | ORAL_TABLET | Freq: Every day | ORAL | Status: DC
  Administered 2018-06-12: 2.5 mg via ORAL
  Filled 2018-06-12: qty 1

## 2018-06-12 MED ORDER — HEPARIN SODIUM (PORCINE) 5000 UNIT/ML IJ SOLN
5000.0000 [IU] | Freq: Three times a day (TID) | INTRAMUSCULAR | Status: DC
  Administered 2018-06-12 – 2018-06-14 (×6): 5000 [IU] via SUBCUTANEOUS
  Filled 2018-06-12 (×6): qty 1

## 2018-06-12 MED ORDER — HYDRALAZINE HCL 20 MG/ML IJ SOLN
10.0000 mg | INTRAMUSCULAR | Status: DC | PRN
  Administered 2018-06-12: 10 mg via INTRAVENOUS
  Filled 2018-06-12: qty 1

## 2018-06-12 MED ORDER — HYDROCODONE-ACETAMINOPHEN 5-325 MG PO TABS: 1.0000 | ORAL_TABLET | ORAL | Status: DC | PRN

## 2018-06-12 MED ORDER — PANTOPRAZOLE SODIUM 40 MG PO TBEC
40.0000 mg | DELAYED_RELEASE_TABLET | Freq: Every day | ORAL | Status: DC
  Administered 2018-06-12 – 2018-06-14 (×3): 40 mg via ORAL
  Filled 2018-06-12 (×3): qty 1

## 2018-06-12 MED ORDER — ACETAMINOPHEN 325 MG PO TABS
650.0000 mg | ORAL_TABLET | Freq: Four times a day (QID) | ORAL | Status: DC | PRN
  Administered 2018-06-14: 650 mg via ORAL
  Filled 2018-06-12: qty 2

## 2018-06-12 MED ORDER — ATORVASTATIN CALCIUM 20 MG PO TABS
40.0000 mg | ORAL_TABLET | Freq: Every day | ORAL | Status: DC
  Administered 2018-06-13: 40 mg via ORAL
  Filled 2018-06-12: qty 2

## 2018-06-12 NOTE — Evaluation (Signed)
Physical Therapy Evaluation Patient Details Name: Terry Hendrix MRN: [id number] DOB: 1927-04-25 Today's Date: 06/12/2018   History of Present Illness  Terry Hendrix is a 84 y.o. female with a past medical history of dementia, anemia, arthritis, hyperlipidemia, depression, hypertension, presents to the emergency department for near syncopal episode and low blood pressure.  According to the son the patient was discharged from the hospital yesterday at 5 PM.  He picked the patient up and brought her home.  States upon getting to the house he attempted to get the patient out of the car and she was slumped forwards, not responding to him.  States he was able to get her into a wheelchair and into the house.  Checked her blood pressure and it was 50s over 40s.  Waited approximately 15 minutes checked again it was 81s over 62s.  States he called EMS, however when they arrived the patient was starting to come back around and her blood pressure did increase to 115.  They attempted to stand the patient she became very lightheaded and dizzy once again took her blood pressure and it was 90 systolic, so they transported the patient to the emergency department for further evaluation. Patient has history of dementia and is poor historian, required son and grandson to give history. Son reports she is in the [facility] and has help tues, thurs, sat for 2 hours and wed fri sun for a little longer.   Clinical Impression  Patient is a pleasant 84 year old female who presents with generalized weakness, hypotension, and dementia. Patient requires additional time for cue response with max verbal and tactile cueing. Patient inconsistent with assistance levels required, occasionally performing tasks with decreased assistance (mod I for sit to supine) and other times requiring Min A. Pt. Challenged with functional mobility, demonstrating poor safety awareness and high fall risk. Patient has bilateral LE weakness and  poor standing tolerance. Due to patient's poor safety awareness, limited mobility/ability to participate in ADLs, cognitive deficits, incontinence (per verbal report), and high fall risk patient will benefit from 24/7 assistance in a skilled nursing facility. Patient will benefit from continued skilled physical therapy to improve strength, safety awareness, balance, mobility, and capacity for functional activities.     Follow Up Recommendations SNF(Patient part of [facility] program. Requires 24/7 care due to cognitive impairment, incontinence, and high fall risk)    Equipment Recommendations       Recommendations for Other Services       Precautions / Restrictions Precautions Precautions: Fall Precaution Comments: monitor BP when changing positions Restrictions Weight Bearing Restrictions: No Other Position/Activity Restrictions: Monitor BP with changing positions due to unstable vitals.       Mobility  Bed Mobility Overal bed mobility: Needs Assistance             General bed mobility comments: Patient alternates between needing assistance and being independent with bed mobility depending upon cognitive status. Required Min A for supine to sit one time but performed mod I another. Sit to supine performed Mod I one time , Min A one time.   Transfers Overall transfer level: Needs assistance Equipment used: Standard walker Transfers: Sit to/from Stand;Lateral/Scoot Transfers Sit to Stand: Min assist        Lateral/Scoot Transfers: Mod assist General transfer comment: Patient's cognition impairs ability to follow commands/comprehend environment/situation. STS performed with Min A and max verbal cueing.   Ambulation/Gait             General Gait  Details: patient unable to tolerate >2 minutes standing,   Stairs            Wheelchair Mobility    Modified Rankin (Stroke Patients Only)       Balance Overall balance assessment: Needs assistance Sitting-balance  support: Feet supported;Single extremity supported Sitting balance-Leahy Scale: Fair Sitting balance - Comments: Able to perform once focused on task, when mind wandered had posterior LOB Postural control: Posterior lean Standing balance support: Bilateral upper extremity supported Standing balance-Leahy Scale: Poor Standing balance comment: Requires BUE support and CGA/Min A to retain standing position Requires BUE support to march in place.                              Pertinent Vitals/Pain Pain Assessment: No/denies pain    Home Living Family/patient expects to be discharged to:: Private residence Living Arrangements: Alone Available Help at Discharge: Family;Other (Comment)([facility]) Type of Home: House Home Access: Stairs to enter Entrance Stairs-Rails: Can reach both Entrance Stairs-Number of Steps: 2 Home Layout: Able to live on main level with bedroom/bathroom;Multi-level Home Equipment: Walker - 2 wheels;Cane - single point;Bedside commode;Shower seat;Walker - 4 wheels Additional Comments: Patient used walker at home prior to admittance. Has help 2 hours a day 3x/week.     Prior Function Level of Independence: Needs assistance   Gait / Transfers Assistance Needed: Ambulates with walker at home per son report.   ADL's / Homemaking Assistance Needed: assist for bathing, dressing from [facility], family sets up medications and provides transportation  Comments: [facility] program      Hand Dominance   Dominant Hand: Right    Extremity/Trunk Assessment   Upper Extremity Assessment Upper Extremity Assessment: Generalized weakness    Lower Extremity Assessment Lower Extremity Assessment: Generalized weakness;Difficult to assess due to impaired cognition(gross 3+/5; difficult to ascertain due to cognitive impairment) RLE Deficits / Details: gross 3+/5 RLE Coordination: decreased gross motor LLE Deficits / Details: gross 3+/5 LLE Coordination: decreased gross motor     Cervical / Trunk Assessment Cervical / Trunk Assessment: Normal  Communication   Communication: Other (comment)(dementia, difficulty with following conversation/requests)  Cognition Arousal/Alertness: Lethargic Behavior During Therapy: Flat affect Overall Cognitive Status: History of cognitive impairments - at baseline                                 General Comments: Patient has history of dementia. Requires tactile and verbal cueing for task orientation/sequencing. Difficult to remain on task.       General Comments General comments (skin integrity, edema, etc.): No noted swelling/redness/temperature change in LEs    Exercises General Exercises - Lower Extremity Ankle Circles/Pumps: AROM;Strengthening;Both;10 reps;Supine(max verbal/tactile cues) Quad Sets: Strengthening;Both;10 reps;Supine(max verbal/tactile cues) Heel Slides: AROM;Strengthening;Both;10 reps;Supine(max verbal/tactile cues) Hip Flexion/Marching: Strengthening;Both;5 reps;Seated;Standing;10 reps(10x each leg standing with RW, 5x each leg seated) Other Exercises Other Exercises: seated EOB balance and stability, posterior LOB with distraction Other Exercises: standing with BUE support and Min A when distracted, marching while standing, stand look in mirror while encouraging upright posture and keeping eyes open.    Assessment/Plan    PT Assessment Patient needs continued PT services  PT Problem List Decreased strength;Decreased activity tolerance;Decreased balance;Decreased knowledge of use of DME;Decreased cognition;Decreased coordination;Decreased mobility;Decreased safety awareness       PT Treatment Interventions DME instruction;Gait training;Stair training;Therapeutic exercise;Therapeutic activities;Functional mobility training;Balance training;Neuromuscular re-education;Manual techniques;Patient/family education  PT Goals (Current goals can be found in the Care Plan section)  Acute Rehab PT  Goals Patient Stated Goal: patient's family wants her to be able to walk again, patient unable to state her own goal. PT Goal Formulation: With family Time For Goal Achievement: 06/26/18 Potential to Achieve Goals: Fair    Frequency Min 2X/week   Barriers to discharge Decreased caregiver support Patient has 2 hour support 3x/week, 5-6 hours per day 2x/week    Co-evaluation               [facility] PT "6 Clicks" Daily Activity  Outcome Measure Difficulty turning over in bed (including adjusting bedclothes, sheets and blankets)?: A Little Difficulty moving from lying on back to sitting on the side of the bed? : A Lot Difficulty sitting down on and standing up from a chair with arms (e.g., wheelchair, bedside commode, etc,.)?: Unable Help needed moving to and from a bed to chair (including a wheelchair)?: A Lot Help needed walking in hospital room?: A Lot Help needed climbing 3-5 steps with a railing? : Total 6 Click Score: 11    End of Session Equipment Utilized During Treatment: Gait belt Activity Tolerance: Patient limited by fatigue;Other (comment)(limited ability to maintain task orientation) Patient left: in bed;with bed alarm set;with call bell/phone within reach;with family/visitor present Nurse Communication: Mobility status PT Visit Diagnosis: Unsteadiness on feet (R26.81);Other abnormalities of gait and mobility (R26.89);Muscle weakness (generalized) (M62.81);Repeated falls (R29.6)    Time: 0677-0340 PT Time Calculation (min) (ACUTE ONLY): 54 min   Charges:   PT Evaluation $PT Eval Moderate Complexity: 1 Mod PT Treatments $Therapeutic Exercise: 8-22 mins $Neuromuscular Re-education: 8-22 mins   PT G Codes:        Janna Arch, PT, DPT    Janna Arch 06/12/2018, 4:31 PM

## 2018-06-12 NOTE — H&P (Signed)
[facility] NAME: Terry Hendrix    MR#:  [id number]  DATE OF BIRTH:  [birth year]  DATE OF ADMISSION:  06/11/2018  PRIMARY CARE PHYSICIAN: Petra Kuba, MD   REQUESTING/REFERRING PHYSICIAN:   CHIEF COMPLAINT:   Chief Complaint  Patient presents with  . Near Syncope    HISTORY OF PRESENT ILLNESS: Terry Hendrix  is a 84 y.o. female with a known history of dementia, osteoarthritis, hypertension and other comorbidities. Patient is unable to provide reliable history due to her dementia.  Most of the information was taken from reviewing the medical records and from discussion with emergency room physician. She was brought to emergency room for near syncopal episode and low blood pressure, noted by patient's son.  The patient was just discharged from the hospital around 5 PM yesterday.  During the ride home, in the car, patient complained of lightheadedness.  She was noted with generalized weakness, but was still able to answer questions.  Per patient's son blood pressure was at that time 50/40.  Blood pressure improved to 90/40 after 15 minutes.  However patient was still complaining of severe lightheadedness, especially when attempting to stand up. At the arrival to emergency room, blood pressure was 105/71 and lightheadedness resolved. Blood test done emergency room including CBC and CMP are grossly unremarkable except for creatinine level slightly elevated at 1.27. EKG shows sinus rhythm at 71 bpm, normal axis and nonspecific ST changes.  No ST elevation. Patient is admitted for further evaluation and treatment.  PAST MEDICAL HISTORY:   Past Medical History:  Diagnosis Date  . Allergy   . Anemia   . Arthritis   . Benign essential hypertension 03/16/2014  . Breast cancer ([place]) 12/2016   Invasive mammary carcinoma  . Dementia   . GIB (gastrointestinal bleeding) 05/04/2016  . Glaucoma   . HLD (hyperlipidemia)   .  Hypertension   . MDD (major depressive disorder)     PAST SURGICAL HISTORY:  Past Surgical History:  Procedure Laterality Date  . BREAST BIOPSY Left 12/2016   Invasive Mammary Carcinoma  . COLONIC EMBOLIZATION  2014  . EYE SURGERY Bilateral    Cataract Extraction  . LEFT HEART CATH AND CORONARY ANGIOGRAPHY Left 03/13/2017   Procedure: Left Heart Cath and Coronary Angiography;  Surgeon: Teodoro Spray, MD;  Location: [place];  Service: Cardiovascular;  Laterality: Left;    SOCIAL HISTORY:  Social History   Tobacco Use  . Smoking status: Never Smoker  . Smokeless tobacco: Never Used  Substance Use Topics  . Alcohol use: No    FAMILY HISTORY:  Family History  Problem Relation Age of Onset  . Breast cancer Mother   . Breast cancer Sister   . Heart disease Sister   . Heart disease Brother   . Colon cancer Neg Hx     DRUG ALLERGIES: No Known Allergies  REVIEW OF SYSTEMS:   Unable to obtain due to patient's confusion secondary to dementia.  MEDICATIONS AT HOME:  Prior to Admission medications   Medication Sig Start Date End Date Taking? Authorizing Provider  amLODipine (NORVASC) 2.5 MG tablet Take 1 tablet (2.5 mg total) by mouth daily. Take extra 2.5 mg IF your BP is > 140 09/10/17  Yes Fritzi Mandes, MD  atorvastatin (LIPITOR) 40 MG tablet Take 40 mg by mouth daily.    Yes [provider]  letrozole (FEMARA) 2.5 MG tablet Take 1 tablet (2.5 mg  total) by mouth daily. 04/05/17  Yes Lloyd Huger, MD  pantoprazole (PROTONIX) 40 MG tablet Take 40 mg by mouth daily.   Yes [provider]  PARoxetine (PAXIL) 10 MG tablet Take 10 mg by mouth daily.   Yes [provider]  travoprost, benzalkonium, (TRAVATAN) 0.004 % ophthalmic solution Place 1 drop into both eyes at bedtime.   Yes [provider]      PHYSICAL EXAMINATION:   VITAL SIGNS: Blood pressure (!) 173/124, pulse 71, temperature 97.6 F (36.4 C), temperature source  Oral, resp. rate 20, height 5\' 6"  (1.676 m), weight 59.1 kg (130 lb 4.7 oz), SpO2 100 %.  GENERAL:  84 y.o.-year-old patient lying in the bed with no acute distress.  EYES: Pupils equal, round, reactive to light and accommodation. No scleral icterus. Extraocular muscles intact.  HEENT: Head atraumatic, normocephalic. Oropharynx and nasopharynx clear.  NECK:  Supple, no jugular venous distention. No thyroid enlargement, no tenderness.  LUNGS: Normal breath sounds bilaterally, no wheezing, rales,rhonchi or crepitation. No use of accessory muscles of respiration.  CARDIOVASCULAR: S1, S2 normal. No 3/S4.  ABDOMEN: Soft, nontender, nondistended. Bowel sounds present. No organomegaly or mass.  EXTREMITIES: No pedal edema, cyanosis, or clubbing.  NEUROLOGIC: No focal weakness. PSYCHIATRIC: The patient is alert, but confused.  SKIN: No obvious rash, lesion, or ulcer.   LABORATORY PANEL:   CBC Recent Labs  Lab 06/09/18 1009 06/11/18 2145  WBC 6.4 6.4  HGB 11.8* 12.9  HCT 35.4 39.3  PLT 210 205  MCV 87.1 89.1  MCH 29.1 29.1  MCHC 33.5 32.7  RDW 15.1* 15.6*   ------------------------------------------------------------------------------------------------------------------  Chemistries  Recent Labs  Lab 06/09/18 1009 06/10/18 0901 06/11/18 2145  NA 140 138 137  K 4.3 4.1 4.4  CL 104 105 104  CO2 27 23 23   GLUCOSE 120* 88 119*  BUN 31* 23 25*  CREATININE 1.77* 1.25* 1.27*  CALCIUM 9.6 9.1 9.2  AST 44*  --  37  ALT 16  --  19  ALKPHOS 46  --  57  BILITOT 1.2  --  0.7   ------------------------------------------------------------------------------------------------------------------ estimated creatinine clearance is 28 mL/min (A) (by C-G formula based on SCr of 1.27 mg/dL (H)). ------------------------------------------------------------------------------------------------------------------ No results for input(s): TSH, T4TOTAL, T3FREE, THYROIDAB in the last 72  hours.  Invalid input(s): FREET3   Coagulation profile No results for input(s): INR, PROTIME in the last 168 hours. ------------------------------------------------------------------------------------------------------------------- No results for input(s): DDIMER in the last 72 hours. -------------------------------------------------------------------------------------------------------------------  Cardiac Enzymes Recent Labs  Lab 06/09/18 2317 06/10/18 0901 06/11/18 2145  TROPONINI 0.04* <0.03 <0.03   ------------------------------------------------------------------------------------------------------------------ Invalid input(s): POCBNP  ---------------------------------------------------------------------------------------------------------------  Urinalysis    Component Value Date/Time   COLORURINE YELLOW (A) 06/09/2018 1009   APPEARANCEUR CLEAR (A) 06/09/2018 1009   LABSPEC 1.010 06/09/2018 1009   PHURINE 7.0 06/09/2018 1009   GLUCOSEU NEGATIVE 06/09/2018 1009   HGBUR NEGATIVE 06/09/2018 1009   BILIRUBINUR NEGATIVE 06/09/2018 1009   [place] 06/09/2018 1009   PROTEINUR NEGATIVE 06/09/2018 1009   UROBILINOGEN 1.0 07/08/2010 1301   NITRITE NEGATIVE 06/09/2018 1009   LEUKOCYTESUR NEGATIVE 06/09/2018 1009     RADIOLOGY: No results found.  EKG: Orders placed or performed during the hospital encounter of 06/11/18  . ED EKG  . ED EKG  . EKG 12-Lead  . EKG 12-Lead  . EKG 12-Lead  . EKG 12-Lead    IMPRESSION AND PLAN:  1.  Recurrent near syncopal episode, secondary to orthostatic hypotension.  We will start  gentle IV hydration and hold blood pressure medication.  Continue to monitor on telemetry and follow troponin levels. 2. ARF, likely prerenal, secondary to poor p.o. Intake.  Patient encouraged to increase p.o. fluid intake.  We will start gentle IV hydration and monitor kidney function closely.  Avoid nephrotoxic medications. 3.  Hypertension.   Patient has a history of labile hypertension, very difficult to treat.  At this point I would recommend avoiding Norvasc, as it seems to be too strong for the patient, causing orthostatic hypotension.  Perhaps, we can try metoprolol small dose, if needed for hypertension.  All the records are reviewed and case discussed with ED provider. Management plans discussed with the patient and she is in agreement.  CODE STATUS: Full    Code Status Orders  (From admission, onward)        Start     Ordered   06/12/18 0301  Full code  Continuous     06/12/18 0300    Code Status History    Date Active Date Inactive Code Status Order ID Comments User Context   06/09/2018 1723 06/11/2018 2014 Full Code [id number]  Saundra Shelling, MD Inpatient   03/08/2018 1959 03/09/2018 2155 Full Code [id number]  Gladstone Lighter, MD Inpatient   09/09/2017 2226 09/10/2017 2124 Full Code [id number]  Harrie Foreman, MD Inpatient   03/13/2017 1606 03/13/2017 2053 Full Code [id number]  Teodoro Spray, MD Inpatient   05/04/2016 1741 05/10/2016 2034 Full Code [id number]  Bettey Costa, MD Inpatient    Advance Directive Documentation     Most Recent Value  Type of Advance Directive  Healthcare Power of Attorney  Pre-existing out of facility DNR order (yellow form or pink MOST form)  -  "MOST" Form in Place?  -       TOTAL TIME TAKING CARE OF THIS PATIENT: 45 minutes.    Amelia Jo M.D on 06/12/2018 at 6:08 AM  Between 7am to 6pm - Pager - [phone]  After 6pm go to [email or web address] - password [email or web address] [facility]  Office  [phone]  CC: Primary care physician; Petra Kuba, MD

## 2018-06-12 NOTE — Progress Notes (Signed)
Patient visited with patient and her niece Estill Bamberg. Patient required care, so Chaplain explained pastoral care services and reminded them that Chaplains are available everyday.

## 2018-06-13 DIAGNOSIS — Z853 Personal history of malignant neoplasm of breast: Secondary | ICD-10-CM | POA: Diagnosis not present

## 2018-06-13 DIAGNOSIS — H409 Unspecified glaucoma: Secondary | ICD-10-CM | POA: Diagnosis present

## 2018-06-13 DIAGNOSIS — N179 Acute kidney failure, unspecified: Secondary | ICD-10-CM | POA: Diagnosis present

## 2018-06-13 DIAGNOSIS — E785 Hyperlipidemia, unspecified: Secondary | ICD-10-CM | POA: Diagnosis present

## 2018-06-13 DIAGNOSIS — F0390 Unspecified dementia without behavioral disturbance: Secondary | ICD-10-CM | POA: Diagnosis present

## 2018-06-13 DIAGNOSIS — Z79899 Other long term (current) drug therapy: Secondary | ICD-10-CM | POA: Diagnosis not present

## 2018-06-13 DIAGNOSIS — I10 Essential (primary) hypertension: Secondary | ICD-10-CM | POA: Diagnosis present

## 2018-06-13 DIAGNOSIS — R531 Weakness: Secondary | ICD-10-CM | POA: Diagnosis present

## 2018-06-13 DIAGNOSIS — I951 Orthostatic hypotension: Secondary | ICD-10-CM | POA: Diagnosis present

## 2018-06-13 LAB — GLUCOSE, CAPILLARY: GLUCOSE-CAPILLARY: 117 mg/dL — AB (ref 70–99)

## 2018-06-13 MED ORDER — MIDODRINE HCL 5 MG PO TABS
10.0000 mg | ORAL_TABLET | Freq: Three times a day (TID) | ORAL | Status: DC
  Administered 2018-06-13 – 2018-06-14 (×5): 10 mg via ORAL
  Filled 2018-06-13 (×6): qty 2

## 2018-06-13 NOTE — Care Management (Signed)
Patient admitted from home with hypotension.  Patient was discharged from this facility on 06/11/18.  Patient lives at home, her cousin is in the home.  Sons live locally for support PCP [place].  [facility] is [facility] by [facility], they are aware of admission. PT is currently recommending SNF.

## 2018-06-13 NOTE — Progress Notes (Addendum)
Per Dr Jerelyn Charles, repeat orthostatic vitals.   Sitting 193/77 Standing 102/77 Standing 59mins 100/69  Notified Dr Jerelyn Charles. Will not treat patient with PRN Hydralazine due to the large drop in BP upon standing but will continue to administer Midodrine.

## 2018-06-13 NOTE — Clinical Social Work Note (Signed)
Clinical Social Work Assessment  Patient Details  Name: Terry Hendrix MRN: [id number] Date of Birth: 1927-08-03  Date of referral:  06/13/18               Reason for consult:  Discharge Planning                Permission sought to share information with:    Permission granted to share information::     Name::        Agency::     Relationship::     Contact Information:     Housing/Transportation Living arrangements for the past 2 months:  Single Family Home Source of Information:  Adult Children(son) Patient Interpreter Needed:  None Criminal Activity/Legal Involvement Pertinent to Current Situation/Hospitalization:  No - Comment as needed Significant Relationships:  Adult Children, Other(Comment)(other relatives) Lives with:  Other (Comment)(family) Do you feel safe going back to the place where you live?  Yes Need for family participation in patient care:  Yes (Comment)  Care giving concerns:  Patient resides at home with family.   Social Worker assessment / plan:  CSW aware that PT has recommended short term rehab. [facility] is followed by [facility] (insurance) and CSW spoke with Aram Beecham at [facility]: [phone] and she states that they are going to approve for patient to go to short term rehab and had already locked in [facility]. CSW contacted patient's son: Tyrone: [phone]. Jiles Prows states that patient began with the [facility] program in April. He is in agreement with her going to [facility] when time. Pasrr has been received. Discharge date unknown at this time.   Employment status:  Retired Nurse, adult PT Recommendations:  [place] / Referral to community resources:     Patient/Family's Response to care:  Patient's son expressed appreciation for CSW assistance.  Patient/Family's Understanding of and Emotional Response to Diagnosis, Current Treatment, and Prognosis:  Patient's son is requesting short term rehab  and is happy that [facility] has approved this.   Emotional Assessment Appearance:  Appears stated age Attitude/Demeanor/Rapport:    Affect (typically observed):    Orientation:  Oriented to Self Alcohol / Substance use:  Not Applicable Psych involvement (Current and /or in the community):  No (Comment)  Discharge Needs  Concerns to be addressed:  Care Coordination Readmission within the last 30 days:  No Current discharge risk:  None Barriers to Discharge:  No Barriers Identified   Shela Leff, LCSW 06/13/2018, 2:36 PM

## 2018-06-13 NOTE — NC FL2 (Signed)
[place] LEVEL OF CARE SCREENING TOOL     IDENTIFICATION  Patient Name: Terry Hendrix Birthdate: [birth year] Sex: female Admission Date (Current Location): 06/11/2018  [facility] and [place] Number:  Engineering geologist and Address:  [facility], [address] [place], [place] [address]      Provider Number: [id number]  Attending Physician Name and Address:  Gorden Harms, MD  Relative Name and Phone Number:       Current Level of Care: Hospital Recommended Level of Care: [place] Prior Approval Number:    Date Approved/Denied:   PASRR Number:    Discharge Plan: SNF    Current Diagnoses: Patient Active Problem List   Diagnosis Date Noted  . Syncope 06/12/2018  . Near syncope 06/09/2018  . UTI (urinary tract infection) 03/08/2018  . Vertigo 09/09/2017  . Dizziness 09/09/2017  . Aneurysm of thoracic aorta ([place]) 02/14/2017  . AAA (abdominal aortic aneurysm) without rupture ([place]) 02/14/2017  . SOB (shortness of breath) 02/11/2017  . Anemia   . MDD (major depressive disorder)   . Glaucoma   . HLD (hyperlipidemia)   . Hypertension   . Allergy   . Arthritis   . Primary cancer of upper outer quadrant of left female breast ([place]) 01/11/2017  . GIB (gastrointestinal bleeding) 05/04/2016  . Atrial fibrillation ([place]) 05/04/2016  . Right shoulder pain 04/18/2016  . Benign essential hypertension 03/16/2014    Orientation RESPIRATION BLADDER Height & Weight     Self  Normal Incontinent Weight: 130 lb 4.7 oz (59.1 kg) Height:  5\' 6"  (167.6 cm)  BEHAVIORAL SYMPTOMS/MOOD NEUROLOGICAL BOWEL NUTRITION STATUS  (none) (none) Continent Diet(cardiac)  AMBULATORY STATUS COMMUNICATION OF NEEDS Skin   Extensive Assist Verbally Normal                       Personal Care Assistance Level of Assistance  Bathing, Dressing Bathing Assistance: Limited assistance   Dressing Assistance: Limited assistance      Functional Limitations Info  Hearing   Hearing Info: Impaired      SPECIAL CARE FACTORS FREQUENCY  PT (By licensed PT)                    Contractures Contractures Info: Not present    Additional Factors Info  Code Status, Allergies Code Status Info: full Allergies Info: nka           Current Medications (06/13/2018):  This is the current hospital active medication list Current Facility-Administered Medications  Medication Dose Route Frequency Provider Last Rate Last Dose  . acetaminophen (TYLENOL) tablet 650 mg  650 mg Oral Q6H PRN Amelia Jo, MD       Or  . acetaminophen (TYLENOL) suppository 650 mg  650 mg Rectal Q6H PRN Amelia Jo, MD      . atorvastatin (LIPITOR) tablet 40 mg  40 mg Oral Daily Amelia Jo, MD      . bisacodyl (DULCOLAX) EC tablet 5 mg  5 mg Oral Daily PRN Amelia Jo, MD      . docusate sodium (COLACE) capsule 100 mg  100 mg Oral BID Amelia Jo, MD   100 mg at 06/13/18 1102  . heparin injection 5,000 Units  5,000 Units Subcutaneous Q8H Amelia Jo, MD   5,000 Units at 06/13/18 0535  . hydrALAZINE (APRESOLINE) injection 10 mg  10 mg Intravenous Q4H PRN Loney Hering D, MD   10 mg at 06/12/18 2101  .  HYDROcodone-acetaminophen (NORCO/VICODIN) 5-325 MG per tablet 1-2 tablet  1-2 tablet Oral Q4H PRN Amelia Jo, MD      . letrozole [facility]) tablet 2.5 mg  2.5 mg Oral Daily Amelia Jo, MD   2.5 mg at 06/13/18 1102  . midodrine (PROAMATINE) tablet 10 mg  10 mg Oral TID WC Salary, Montell D, MD   10 mg at 06/13/18 1103  . ondansetron (ZOFRAN) tablet 4 mg  4 mg Oral Q6H PRN Amelia Jo, MD       Or  . ondansetron [facility]) injection 4 mg  4 mg Intravenous Q6H PRN Amelia Jo, MD      . pantoprazole (PROTONIX) EC tablet 40 mg  40 mg Oral Daily Amelia Jo, MD   40 mg at 06/13/18 1102  . PARoxetine (PAXIL) tablet 10 mg  10 mg Oral Daily Amelia Jo, MD   10 mg at 06/13/18 1102  . Travoprost (BAK Free) (TRAVATAN) 0.004 %  ophthalmic solution SOLN 1 drop  1 drop Both Eyes QHS Amelia Jo, MD   1 drop at 06/12/18 2104  . traZODone (DESYREL) tablet 25 mg  25 mg Oral QHS PRN Amelia Jo, MD         Discharge Medications: Please see discharge summary for a list of discharge medications.  Relevant Imaging Results:  Relevant Lab Results:   Additional Information ss: [id number]  Shela Leff, LCSW

## 2018-06-13 NOTE — Progress Notes (Signed)
[place] at [place] NAME: Terry Hendrix    MR#:  [id number]  DATE OF BIRTH:  1927-07-17  SUBJECTIVE:  CHIEF COMPLAINT:   Chief Complaint  Patient presents with  . Near Syncope  Continued orthostasis noted, start midodrine  REVIEW OF SYSTEMS:  CONSTITUTIONAL: No fever, fatigue or weakness.  EYES: No blurred or double vision.  EARS, NOSE, AND THROAT: No tinnitus or ear pain.  RESPIRATORY: No cough, shortness of breath, wheezing or hemoptysis.  CARDIOVASCULAR: No chest pain, orthopnea, edema.  GASTROINTESTINAL: No nausea, vomiting, diarrhea or abdominal pain.  GENITOURINARY: No dysuria, hematuria.  ENDOCRINE: No polyuria, nocturia,  HEMATOLOGY: No anemia, easy bruising or bleeding SKIN: No rash or lesion. MUSCULOSKELETAL: No joint pain or arthritis.   NEUROLOGIC: No tingling, numbness, weakness.  PSYCHIATRY: No anxiety or depression.   ROS  DRUG ALLERGIES:  No Known Allergies  VITALS:  Blood pressure 93/68, pulse 85, temperature 97.9 F (36.6 C), temperature source Oral, resp. rate 18, height 5\' 6"  (1.676 m), weight 59.1 kg (130 lb 4.7 oz), SpO2 100 %.  PHYSICAL EXAMINATION:  GENERAL:  84 y.o.-year-old patient lying in the bed with no acute distress.  EYES: Pupils equal, round, reactive to light and accommodation. No scleral icterus. Extraocular muscles intact.  HEENT: Head atraumatic, normocephalic. Oropharynx and nasopharynx clear.  NECK:  Supple, no jugular venous distention. No thyroid enlargement, no tenderness.  LUNGS: Normal breath sounds bilaterally, no wheezing, rales,rhonchi or crepitation. No use of accessory muscles of respiration.  CARDIOVASCULAR: S1, S2 normal. No murmurs, rubs, or gallops.  ABDOMEN: Soft, nontender, nondistended. Bowel sounds present. No organomegaly or mass.  EXTREMITIES: No pedal edema, cyanosis, or clubbing.  NEUROLOGIC: Cranial nerves II through XII are intact. Muscle strength 5/5 in all  extremities. Sensation intact. Gait not checked.  PSYCHIATRIC: The patient is alert and oriented x 3.  SKIN: No obvious rash, lesion, or ulcer.   Physical Exam LABORATORY PANEL:   CBC Recent Labs  Lab 06/12/18 0710  WBC 4.9  HGB 12.1  HCT 35.0  PLT 185   ------------------------------------------------------------------------------------------------------------------  Chemistries  Recent Labs  Lab 06/11/18 2145 06/12/18 0710  NA 137 138  K 4.4 3.9  CL 104 108  CO2 23 23  GLUCOSE 119* 87  BUN 25* 22  CREATININE 1.27* 1.04*  CALCIUM 9.2 8.9  AST 37  --   ALT 19  --   ALKPHOS 57  --   BILITOT 0.7  --    ------------------------------------------------------------------------------------------------------------------  Cardiac Enzymes Recent Labs  Lab 06/10/18 0901 06/11/18 2145  TROPONINI <0.03 <0.03   ------------------------------------------------------------------------------------------------------------------  RADIOLOGY:  No results found.  ASSESSMENT AND PLAN:  *Acute recurrent near syncopal episode Secondary to orthostatic hypotension Start midodrine, antihypertensives discontinued, will have to let blood pressure run high to avoid further episodes of syncope, physical therapy input appreciated-recommending skilled nursing facility status post discharge  * ARF likely prerenal from poor p.o. Intake Resolved Continue to avoid nephrotoxic medications  *Hypertension Plan of care per above Hydralazine as needed systolic blood pressure greater than 160   All the records are reviewed and case discussed with Care Management/Social Workerr. Management plans discussed with the patient, family and they are in agreement.  CODE STATUS: full  TOTAL TIME TAKING CARE OF THIS PATIENT: 35 minutes.     POSSIBLE D/C IN 1-2 DAYS, DEPENDING ON CLINICAL CONDITION.   Avel Peace Sabastien Tyler M.D on 06/13/2018   Between 7am to 6pm - Pager - [phone]  After 6pm go  to [email or web address] - password [email or web address] [facility] [facility]  Office  [phone]  CC: Primary care physician; Petra Kuba, MD  Note: This dictation was prepared with Dragon dictation along with smaller phrase technology. Any transcriptional errors that result from this process are unintentional.

## 2018-06-14 LAB — GLUCOSE, CAPILLARY: Glucose-Capillary: 90 mg/dL (ref 70–99)

## 2018-06-14 MED ORDER — MIDODRINE HCL 10 MG PO TABS: 10.0000 mg | ORAL_TABLET | Freq: Three times a day (TID) | ORAL | 0 refills | Status: AC

## 2018-06-14 NOTE — Progress Notes (Signed)
Report called to Amy @ [facility]. EMS notified of transport there need, she's 4th in line.  106, Son, in room and aware of impending d/c, his ?'s answered.

## 2018-06-14 NOTE — Clinical Social Work Note (Signed)
The patient will discharge today to [facility] via non-emergent EMS. The patient, her son, and the facility are aware and in agreement. The CSW has delivered the discharge packet and sent all documentation to the facility. The CSW is signing off. Please consult should needs arise.  Santiago Bumpers, MSW, Latanya Presser [phone]

## 2018-06-14 NOTE — Progress Notes (Signed)
EMS taking her now to [facility].

## 2018-06-14 NOTE — Discharge Summary (Signed)
[place] at [place] NAME: Terry Hendrix    MR#:  [id number]  DATE OF BIRTH:  1927/10/15  DATE OF ADMISSION:  06/11/2018 ADMITTING PHYSICIAN: Amelia Jo, MD  DATE OF DISCHARGE: No discharge date for patient encounter.  PRIMARY CARE PHYSICIAN: Petra Kuba, MD    ADMISSION DIAGNOSIS:  Weakness [R53.1] Hypotension, unspecified hypotension type [I95.9]  DISCHARGE DIAGNOSIS:  Active Problems:   Syncope   SECONDARY DIAGNOSIS:   Past Medical History:  Diagnosis Date  . Allergy   . Anemia   . Arthritis   . Benign essential hypertension 03/16/2014  . Breast cancer ([place]) 12/2016   Invasive mammary carcinoma  . Dementia   . GIB (gastrointestinal bleeding) 05/04/2016  . Glaucoma   . HLD (hyperlipidemia)   . Hypertension   . MDD (major depressive disorder)     HOSPITAL COURSE:  *Acute recurrent near syncopal episode Stable Secondary to orthostatic hypotension Antihypertensives were discontinued but patient continued to be symptomatic from her orthostatic hypotension.  Patient subsequently started on midodrine and did well, patient noted to have elevated blood pressure which was not treated while in-house because with treatment the patient become symptomatic whenever in standing or sitting positions at times, this information was shared with cardiology as well as the patient's family with all questions answered Antihypertensive medication should be avoided going forward  * ARF likely prerenal from poor p.o. Intake Resolved  *Hypertension Plan of care per above  Disposition to skilled nursing facility per family request  DISCHARGE CONDITIONS:   stable  CONSULTS OBTAINED:    DRUG ALLERGIES:  No Known Allergies  DISCHARGE MEDICATIONS:   Allergies as of 06/14/2018   No Known Allergies     Medication List    STOP taking these medications   amLODipine 2.5 MG tablet Commonly known as:  NORVASC     TAKE  these medications   atorvastatin 40 MG tablet Commonly known as:  LIPITOR Take 40 mg by mouth daily.   letrozole 2.5 MG tablet Commonly known as:  FEMARA Take 1 tablet (2.5 mg total) by mouth daily.   midodrine 10 MG tablet Commonly known as:  PROAMATINE Take 1 tablet (10 mg total) by mouth 3 (three) times daily with meals.   pantoprazole 40 MG tablet Commonly known as:  PROTONIX Take 40 mg by mouth daily.   PARoxetine 10 MG tablet Commonly known as:  PAXIL Take 10 mg by mouth daily.   travoprost (benzalkonium) 0.004 % ophthalmic solution Commonly known as:  TRAVATAN Place 1 drop into both eyes at bedtime.        DISCHARGE INSTRUCTIONS:  If you experience worsening of your admission symptoms, develop shortness of breath, life threatening emergency, suicidal or homicidal thoughts you must seek medical attention immediately by calling 911 or calling your MD immediately  if symptoms less severe.  You Must read complete instructions/literature along with all the possible adverse reactions/side effects for all the Medicines you take and that have been prescribed to you. Take any new Medicines after you have completely understood and accept all the possible adverse reactions/side effects.   Please note  You were cared for by a hospitalist during your hospital stay. If you have any questions about your discharge medications or the care you received while you were in the hospital after you are discharged, you can call the unit and asked to speak with the hospitalist on call if the hospitalist that took care of you is  not available. Once you are discharged, your primary care physician will handle any further medical issues. Please note that NO REFILLS for any discharge medications will be authorized once you are discharged, as it is imperative that you return to your primary care physician (or establish a relationship with a primary care physician if you do not have one) for your aftercare  needs so that they can reassess your need for medications and monitor your lab values.    Today   CHIEF COMPLAINT:   Chief Complaint  Patient presents with  . Near Syncope    HISTORY OF PRESENT ILLNESS:  84 y.o. female with a known history of dementia, osteoarthritis, hypertension and other comorbidities. Patient is unable to provide reliable history due to her dementia.  Most of the information was taken from reviewing the medical records and from discussion with emergency room physician. She was brought to emergency room for near syncopal episode and low blood pressure, noted by patient's son.  The patient was just discharged from the hospital around 5 PM yesterday.  During the ride home, in the car, patient complained of lightheadedness.  She was noted with generalized weakness, but was still able to answer questions.  Per patient's son blood pressure was at that time 50/40.  Blood pressure improved to 90/40 after 15 minutes.  However patient was still complaining of severe lightheadedness, especially when attempting to stand up. At the arrival to emergency room, blood pressure was 105/71 and lightheadedness resolved. Blood test done emergency room including CBC and CMP are grossly unremarkable except for creatinine level slightly elevated at 1.27. EKG shows sinus rhythm at 71 bpm, normal axis and nonspecific ST changes.  No ST elevation. Patient is admitted for further evaluation and treatment. VITAL SIGNS:  Blood pressure (!) 161/101, pulse 65, temperature 98.2 F (36.8 C), temperature source Oral, resp. rate 16, height 5\' 6"  (1.676 m), weight 53.5 kg (118 lb), SpO2 99 %.  I/O:    Intake/Output Summary (Last 24 hours) at 06/14/2018 1055 Last data filed at 06/14/2018 1003 Gross per 24 hour  Intake 360 ml  Output 100 ml  Net 260 ml    PHYSICAL EXAMINATION:  GENERAL:  84 y.o.-year-old patient lying in the bed with no acute distress.  EYES: Pupils equal, round, reactive to light  and accommodation. No scleral icterus. Extraocular muscles intact.  HEENT: Head atraumatic, normocephalic. Oropharynx and nasopharynx clear.  NECK:  Supple, no jugular venous distention. No thyroid enlargement, no tenderness.  LUNGS: Normal breath sounds bilaterally, no wheezing, rales,rhonchi or crepitation. No use of accessory muscles of respiration.  CARDIOVASCULAR: S1, S2 normal. No murmurs, rubs, or gallops.  ABDOMEN: Soft, non-tender, non-distended. Bowel sounds present. No organomegaly or mass.  EXTREMITIES: No pedal edema, cyanosis, or clubbing.  NEUROLOGIC: Cranial nerves II through XII are intact. Muscle strength 5/5 in all extremities. Sensation intact. Gait not checked.  PSYCHIATRIC: The patient is alert and oriented x 3.  SKIN: No obvious rash, lesion, or ulcer.   DATA REVIEW:   CBC Recent Labs  Lab 06/12/18 0710  WBC 4.9  HGB 12.1  HCT 35.0  PLT 185    Chemistries  Recent Labs  Lab 06/11/18 2145 06/12/18 0710  NA 137 138  K 4.4 3.9  CL 104 108  CO2 23 23  GLUCOSE 119* 87  BUN 25* 22  CREATININE 1.27* 1.04*  CALCIUM 9.2 8.9  AST 37  --   ALT 19  --   ALKPHOS 57  --  BILITOT 0.7  --     Cardiac Enzymes Recent Labs  Lab 06/11/18 2145  TROPONINI <0.03    Microbiology Results  Results for orders placed or performed during the hospital encounter of 05/04/16  Urine culture     Status: Abnormal   Collection Time: 05/08/16  1:20 PM  Result Value Ref Range Status   Specimen Description URINE, RANDOM  Final   Special Requests NONE  Final   Culture MULTIPLE SPECIES PRESENT, SUGGEST RECOLLECTION (A)  Final   Report Status 05/09/2016 FINAL  Final    RADIOLOGY:  No results found.  EKG:   Orders placed or performed during the hospital encounter of 06/11/18  . ED EKG  . ED EKG  . EKG 12-Lead  . EKG 12-Lead  . EKG 12-Lead  . EKG 12-Lead      Management plans discussed with the patient, family and they are in agreement.  CODE STATUS:      Code Status Orders  (From admission, onward)        Start     Ordered   06/12/18 0301  Full code  Continuous     06/12/18 0300    Code Status History    Date Active Date Inactive Code Status Order ID Comments User Context   06/09/2018 1723 06/11/2018 2014 Full Code [id number]  Saundra Shelling, MD Inpatient   03/08/2018 1959 03/09/2018 2155 Full Code [id number]  Gladstone Lighter, MD Inpatient   09/09/2017 2226 09/10/2017 2124 Full Code [id number]  Harrie Foreman, MD Inpatient   03/13/2017 1606 03/13/2017 2053 Full Code [id number]  Teodoro Spray, MD Inpatient   05/04/2016 1741 05/10/2016 2034 Full Code [id number]  Bettey Costa, MD Inpatient    Advance Directive Documentation     Most Recent Value  Type of Advance Directive  Healthcare Power of Attorney  Pre-existing out of facility DNR order (yellow form or pink MOST form)  -  "MOST" Form in Place?  -      TOTAL TIME TAKING CARE OF THIS PATIENT: 45 minutes.    Avel Peace Elisheva Fallas M.D on 06/14/2018 at 10:55 AM  Between 7am to 6pm - Pager - [phone]  After 6pm go to [email or web address] - password [email or web address] [place] [facility]  Office  [phone]  CC: Primary care physician; Petra Kuba, MD   Note: This dictation was prepared with Dragon dictation along with smaller phrase technology. Any transcriptional errors that result from this process are unintentional.

## 2018-06-16 ENCOUNTER — Emergency Department: Payer: No Typology Code available for payment source

## 2018-06-16 ENCOUNTER — Emergency Department
Admission: EM | Admit: 2018-06-16 | Discharge: 2018-06-17 | Disposition: A | Discharge status: Other Acute Inpatient Hospital | Payer: No Typology Code available for payment source | Attending: Emergency Medicine | Admitting: Emergency Medicine

## 2018-06-16 ENCOUNTER — Encounter: Payer: Self-pay | Admitting: Emergency Medicine

## 2018-06-16 DIAGNOSIS — I10 Essential (primary) hypertension: Secondary | ICD-10-CM | POA: Insufficient documentation

## 2018-06-16 DIAGNOSIS — I712 Thoracic aortic aneurysm, without rupture, unspecified: Secondary | ICD-10-CM

## 2018-06-16 DIAGNOSIS — I714 Abdominal aortic aneurysm, without rupture, unspecified: Secondary | ICD-10-CM

## 2018-06-16 DIAGNOSIS — F0390 Unspecified dementia without behavioral disturbance: Secondary | ICD-10-CM | POA: Insufficient documentation

## 2018-06-16 DIAGNOSIS — Z79899 Other long term (current) drug therapy: Secondary | ICD-10-CM | POA: Insufficient documentation

## 2018-06-16 DIAGNOSIS — R0789 Other chest pain: Secondary | ICD-10-CM

## 2018-06-16 DIAGNOSIS — Z853 Personal history of malignant neoplasm of breast: Secondary | ICD-10-CM | POA: Diagnosis not present

## 2018-06-16 NOTE — ED Triage Notes (Addendum)
Pt arrived via EMS from [facility] with c/o upper epigastric chest pain x1 day. N/V denied. Pt has hx/o AAA without dissection. Pt received 1 nitro spray in route.

## 2018-06-17 ENCOUNTER — Encounter: Payer: Self-pay | Admitting: Radiology

## 2018-06-17 ENCOUNTER — Ambulatory Visit (HOSPITAL_COMMUNITY)
Admission: AD | Admit: 2018-06-17 | Discharge: 2018-06-17 | Disposition: A | Discharge status: Home / Self Care | Payer: No Typology Code available for payment source | Source: Other Acute Inpatient Hospital | Attending: Emergency Medicine | Admitting: Emergency Medicine

## 2018-06-17 ENCOUNTER — Emergency Department: Payer: No Typology Code available for payment source

## 2018-06-17 DIAGNOSIS — R079 Chest pain, unspecified: Secondary | ICD-10-CM | POA: Insufficient documentation

## 2018-06-17 LAB — CBC
HEMATOCRIT: 35.2 % (ref 35.0–47.0)
HEMOGLOBIN: 12 g/dL (ref 12.0–16.0)
MCH: 29.6 pg (ref 26.0–34.0)
MCHC: 34 g/dL (ref 32.0–36.0)
MCV: 87 fL (ref 80.0–100.0)
Platelets: 190 10*3/uL (ref 150–440)
RBC: 4.05 MIL/uL (ref 3.80–5.20)
RDW: 15.6 % — ABNORMAL HIGH (ref 11.5–14.5)
WBC: 8.7 10*3/uL (ref 3.6–11.0)

## 2018-06-17 LAB — BASIC METABOLIC PANEL
ANION GAP: 9 (ref 5–15)
BUN: 26 mg/dL — ABNORMAL HIGH (ref 8–23)
CHLORIDE: 101 mmol/L (ref 98–111)
CO2: 27 mmol/L (ref 22–32)
Calcium: 9.5 mg/dL (ref 8.9–10.3)
Creatinine, Ser: 1.41 mg/dL — ABNORMAL HIGH (ref 0.44–1.00)
GFR calc non Af Amer: 32 mL/min — ABNORMAL LOW (ref >60)
GFR, EST AFRICAN AMERICAN: 37 mL/min — AB (ref >60)
Glucose, Bld: 137 mg/dL — ABNORMAL HIGH (ref 70–99)
POTASSIUM: 4 mmol/L (ref 3.5–5.1)
Sodium: 137 mmol/L (ref 135–145)

## 2018-06-17 LAB — LIPASE, BLOOD: Lipase: 37 U/L (ref 11–51)

## 2018-06-17 LAB — HEPATIC FUNCTION PANEL
ALBUMIN: 3.5 g/dL (ref 3.5–5.0)
ALT: 35 U/L (ref 0–44)
AST: 47 U/L — AB (ref 15–41)
Alkaline Phosphatase: 63 U/L (ref 38–126)
Bilirubin, Direct: 0.2 mg/dL (ref 0.0–0.2)
Indirect Bilirubin: 0.5 mg/dL (ref 0.3–0.9)
TOTAL PROTEIN: 7.6 g/dL (ref 6.5–8.1)
Total Bilirubin: 0.7 mg/dL (ref 0.3–1.2)

## 2018-06-17 LAB — TROPONIN I: Troponin I: 0.03 ng/mL (ref <0.03)

## 2018-06-17 MED ORDER — IOPAMIDOL (ISOVUE-370) INJECTION 76%
75.0000 mL | Freq: Once | INTRAVENOUS | Status: AC | PRN
  Administered 2018-06-17: 75 mL via INTRAVENOUS

## 2018-06-17 MED ORDER — NITROGLYCERIN 0.4 MG SL SUBL
SUBLINGUAL_TABLET | SUBLINGUAL | Status: AC
  Administered 2018-06-17: 0.4 mg via SUBLINGUAL
  Filled 2018-06-17: qty 1

## 2018-06-17 MED ORDER — NITROGLYCERIN 0.4 MG SL SUBL
0.4000 mg | SUBLINGUAL_TABLET | SUBLINGUAL | Status: DC | PRN
  Administered 2018-06-17: 0.4 mg via SUBLINGUAL

## 2018-06-17 MED ORDER — SODIUM CHLORIDE 0.9 % IV BOLUS
500.0000 mL | Freq: Once | INTRAVENOUS | Status: AC
  Administered 2018-06-17: 500 mL via INTRAVENOUS

## 2018-06-17 MED ORDER — ESMOLOL HCL-SODIUM CHLORIDE 2000 MG/100ML IV SOLN
25.0000 ug/kg/min | Freq: Once | INTRAVENOUS | Status: AC
  Administered 2018-06-17: 25 ug/kg/min via INTRAVENOUS
  Filled 2018-06-17: qty 100

## 2018-06-17 NOTE — ED Notes (Signed)
Report given to [facility] for continuing care updates and plan of transfer for cardiac thoracic surgery @ [facility].

## 2018-06-17 NOTE — ED Provider Notes (Signed)
[facility] Emergency Department Provider Note   ____________________________________________   First MD Initiated Contact with Patient 06/17/18 0005     (approximate)  I have reviewed the triage vital signs and the nursing notes.   HISTORY  Chief Complaint Chest Pain  History obtained from patient's family  HPI Terry Hendrix is a 84 y.o. female who comes into the hospital today from [facility] with some chest pain.  The family states that the patient has a history of dementia but she was complaining of some chest and epigastric pain.  They contacted [facility] and they recommended that the patient receive some nitroglycerin and be transferred here.  The patient has never complained of chest pain in the past.  The symptoms started this evening.  She reports that it is under her breasts and it started around 10 PM.  She has not had any nausea or vomiting nor any shortness of breath.  The patient's blood pressure was initially 220/110.  The patient has a history of AAA which is infrarenal.  There is concern about this AAA.  Past Medical History:  Diagnosis Date  . Allergy   . Anemia   . Arthritis   . Benign essential hypertension 03/16/2014  . Breast cancer ([place]) 12/2016   Invasive mammary carcinoma  . Dementia   . GIB (gastrointestinal bleeding) 05/04/2016  . Glaucoma   . HLD (hyperlipidemia)   . Hypertension   . MDD (major depressive disorder)     Patient Active Problem List   Diagnosis Date Noted  . Syncope 06/12/2018  . Near syncope 06/09/2018  . UTI (urinary tract infection) 03/08/2018  . Vertigo 09/09/2017  . Dizziness 09/09/2017  . Aneurysm of thoracic aorta ([place]) 02/14/2017  . AAA (abdominal aortic aneurysm) without rupture ([place]) 02/14/2017  . SOB (shortness of breath) 02/11/2017  . Anemia   . MDD (major depressive disorder)   . Glaucoma   . HLD (hyperlipidemia)   . Hypertension   . Allergy   . Arthritis   . Primary cancer of  upper outer quadrant of left female breast ([place]) 01/11/2017  . GIB (gastrointestinal bleeding) 05/04/2016  . Atrial fibrillation ([place]) 05/04/2016  . Right shoulder pain 04/18/2016  . Benign essential hypertension 03/16/2014    Past Surgical History:  Procedure Laterality Date  . BREAST BIOPSY Left 12/2016   Invasive Mammary Carcinoma  . COLONIC EMBOLIZATION  2014  . EYE SURGERY Bilateral    Cataract Extraction  . LEFT HEART CATH AND CORONARY ANGIOGRAPHY Left 03/13/2017   Procedure: Left Heart Cath and Coronary Angiography;  Surgeon: Teodoro Spray, MD;  Location: [facility];  Service: Cardiovascular;  Laterality: Left;    Prior to Admission medications   Medication Sig Start Date End Date Taking? Authorizing Provider  atorvastatin (LIPITOR) 40 MG tablet Take 40 mg by mouth daily.    Yes [provider]  letrozole (FEMARA) 2.5 MG tablet Take 1 tablet (2.5 mg total) by mouth daily. 04/05/17  Yes Lloyd Huger, MD  midodrine (PROAMATINE) 2.5 MG tablet Take 2.5 mg by mouth 3 (three) times daily with meals.   Yes [provider]  pantoprazole (PROTONIX) 40 MG tablet Take 40 mg by mouth daily.   Yes [provider]  PARoxetine (PAXIL) 10 MG tablet Take 10 mg by mouth daily.   Yes [provider]  polyethylene glycol (MIRALAX / GLYCOLAX) packet Take 17 g by mouth daily.   Yes [provider]  travoprost,  benzalkonium, (TRAVATAN) 0.004 % ophthalmic solution Place 1 drop into both eyes at bedtime.   Yes [provider]  midodrine (PROAMATINE) 10 MG tablet Take 1 tablet (10 mg total) by mouth 3 (three) times daily with meals. Patient not taking: Reported on 06/17/2018 06/14/18   Salary, Avel Peace, MD    Allergies Patient has no known allergies.  Family History  Problem Relation Age of Onset  . Breast cancer Mother   . Breast cancer Sister   . Heart disease Sister   . Heart disease Brother   . Colon cancer Neg Hx      Social History Social History   Tobacco Use  . Smoking status: Never Smoker  . Smokeless tobacco: Never Used  Substance Use Topics  . Alcohol use: No  . Drug use: No    Review of Systems  Constitutional: No fever/chills Eyes: No visual changes. ENT: No sore throat. Cardiovascular: chest pain. Respiratory: Denies shortness of breath. Gastrointestinal: Epigastric abdominal pain.  No nausea, no vomiting.  No diarrhea.  No constipation. Genitourinary: Negative for dysuria. Musculoskeletal: Negative for back pain. Skin: Negative for rash. Neurological: Negative for headaches, focal weakness or numbness.   ____________________________________________   PHYSICAL EXAM:  VITAL SIGNS: ED Triage Vitals [06/16/18 2347]  Enc Vitals Group     BP (!) 201/126     Pulse Rate 83     Resp 16     Temp 98 F (36.7 C)     Temp Source Oral     SpO2 99 %     Weight 118 lb (53.5 kg)     Height      Head Circumference      Peak Flow      Pain Score      Pain Loc      Pain Edu?      Excl. in [place]?     Constitutional: Alert and oriented. Well appearing and in moderate distress. Eyes: Conjunctivae are normal. PERRL. EOMI. Head: Atraumatic. Nose: No congestion/rhinnorhea. Mouth/Throat: Mucous membranes are moist.  Oropharynx non-erythematous. Cardiovascular: Normal rate, regular rhythm.  Systolic murmur.  Good peripheral circulation. Respiratory: Normal respiratory effort.  No retractions. Lungs CTAB. Gastrointestinal: Soft and nontender. No distention.  Musculoskeletal: No lower extremity tenderness nor edema.   Neurologic:  Normal speech and language.  Skin:  Skin is warm, dry and intact.  Psychiatric: Mood and affect are normal.   ____________________________________________   LABS (all labs ordered are listed, but only abnormal results are displayed)  Labs Reviewed  BASIC METABOLIC PANEL - Abnormal; Notable for the following components:      Result Value   Glucose,  Bld 137 (*)    BUN 26 (*)    Creatinine, Ser 1.41 (*)    GFR calc non Af Amer 32 (*)    GFR calc Af Amer 37 (*)    All other components within normal limits  CBC - Abnormal; Notable for the following components:   RDW 15.6 (*)    All other components within normal limits  TROPONIN I - Abnormal; Notable for the following components:   Troponin I 0.03 (*)    All other components within normal limits  HEPATIC FUNCTION PANEL - Abnormal; Notable for the following components:   AST 47 (*)    All other components within normal limits  LIPASE, BLOOD  TROPONIN I   ____________________________________________  EKG  ED ECG REPORT I, Loney Hering, the attending physician, personally viewed and interpreted this  ECG.   Date: 06/16/2018  EKG Time: 2352  Rate: 81  Rhythm: normal sinus rhythm  Axis: normal  Intervals:right bundle branch block  ST&T Change: flipped t waves in lead V2, V3  ____________________________________________  RADIOLOGY  ED MD interpretation:  CXR: Aortic tortuosity, abnormal proximal descending aortic contour corresponding to aneurysm.  CT angio chest, abd and pelvis: Acute aortic syndrome with intramural hematoma at the level of the arch and isthmus possibly arising from an ulcerated plaque/thrombus at the anterior descending segment, 6cm proximal descending fusiform aortic aneurysm, increased from 4.8cm on 01/22/2017, Infrarenal aortic aneurysm measuring up to 6.4 cm increased from 4.1cm on 01/22/2017, Icm IMA branch aneurysm.   Official radiology report(s): Dg Chest Portable 1 View  Result Date: 06/17/2018 CLINICAL DATA:  84 year old with chest pain. EXAM: PORTABLE CHEST 1 VIEW COMPARISON:  Chest CT 01/22/2017, radiograph 08/05/2010 FINDINGS: Unchanged heart size and mediastinal contours. Aortic tortuosity with abnormal contour of the distal arch and proximal descending, aneurysm on prior CT. No pulmonary edema, focal airspace disease, pleural effusion or  pneumothorax. IMPRESSION: 1. Aortic tortuosity, abnormal proximal descending aortic contour corresponding to aneurysm on February 2018 CT. This appears unchanged allowing for differences in modality. 2. No acute findings. Electronically Signed   By: Jeb Levering M.D.   On: 06/17/2018 01:13   Ct Angio Chest/abd/pel For Dissection W And/or Wo Contrast  Result Date: 06/17/2018 CLINICAL DATA:  Upper epigastric pain for 1 day EXAM: CT ANGIOGRAPHY CHEST, ABDOMEN AND PELVIS TECHNIQUE: Multidetector CT imaging through the chest, abdomen and pelvis was performed using the standard protocol during bolus administration of intravenous contrast. Multiplanar reconstructed images and MIPs were obtained and reviewed to evaluate the vascular anatomy. CONTRAST:  32mL ISOVUE-370 IOPAMIDOL (ISOVUE-370) INJECTION 76% COMPARISON:  01/22/2017 FINDINGS: CTA CHEST FINDINGS Cardiovascular: Aneurysmal thoracic aorta beginning at the isthmus and continuing throughout the descending segment. On precontrast phase there is high-density wall thickening of the distal arch and isthmus. There has been notable progression of the patient's thoracic aneurysm with proximal descending segment dilatation measuring up to 6 cm on coronal reformats, previously 4.8 cm. There is extensive mural thrombus with an ulceration along the anterior wall. Negative for dissection flap. No indication of rupture. Cardiomegaly. No pericardial effusion. Atherosclerotic calcification of the aorta and coronaries. Mediastinum/Nodes: Negative for hematoma or inflammation. Lungs/Pleura: There is no edema, consolidation, effusion, or pneumothorax. Musculoskeletal: Negative Review of the MIP images confirms the above findings. CTA ABDOMEN AND PELVIS FINDINGS VASCULAR Aorta: Fusiform dilatation of the aorta. Maximal dilatation is at the infrarenal segment, measuring up to 6.4 cm. Maximal aortic dimensions previously was 4.1 cm. Prominent mural thrombus. No periaortic edema or  definite wall disruption. Negative for dissection. Celiac: Widely patent. SMA: Atheromatous changes without stenosis or branch occlusion. Renals: Solitary bilateral renal arteries with limitation due to motion. Atherosclerosis. IMA: Patent. Saccular branch aneurysm measuring 1 cm. Smooth sac with no surrounding hemorrhage. Inflow: Atheromatous changes without dissection or aneurysm. Veins: Negative noncontrast appearance Review of the MIP images confirms the above findings. NON-VASCULAR Hepatobiliary: No focal liver abnormality.No evidence of biliary obstruction or stone. Pancreas: Unremarkable. Spleen: Unremarkable. Adrenals/Urinary Tract: Negative adrenals. No hydronephrosis or stone. Unremarkable bladder. Stomach/Bowel:  No obstruction. Colonic diverticulosis. Lymphatic: No mass or adenopathy. Reproductive:No pathologic findings. Other: No ascites or pneumoperitoneum. Musculoskeletal: No acute abnormalities. Chronic L5 pars defects with grade 1 anterolisthesis and accelerated L5-S1 disc degeneration. Osteopenia. Critical Value/emergent results were called by telephone at the time of interpretation on 06/17/2018 at 2:32 am to Dr.  Charlesetta Ivory , who verbally acknowledged these results. Review of the MIP images confirms the above findings. IMPRESSION: 1. Acute aortic syndrome with intramural hematoma at the level of the arch and isthmus, possibly arising from an ulcerated plaque/thrombus at the anterior descending segment. 2. 6 cm proximal descending fusiform aortic aneurysm, increased from 4.8 cm on 01/22/2017. 3. Infrarenal aortic aneurysm measuring up to 6.4 cm, increased from 4.1 cm on 01/22/2017. 4. 1 cm IMA branch aneurysm. Electronically Signed   By: Monte Fantasia M.D.   On: 06/17/2018 02:37    ____________________________________________   PROCEDURES  Procedure(s) performed: please, see procedure note(s).  .Critical Care Performed by: Loney Hering, MD Authorized by: Loney Hering,  MD   Critical care provider statement:    Critical care time (minutes):  60   Critical care start time:  06/17/2018 3:00 AM   Critical care end time:  06/17/2018 4:45 AM   Critical care time was exclusive of:  Separately billable procedures and treating other patients   Critical care was necessary to treat or prevent imminent or life-threatening deterioration of the following conditions: thoracic aortic aneurysm.   Critical care was time spent personally by me on the following activities:  Development of treatment plan with patient or surrogate, discussions with consultants, evaluation of patient's response to treatment, examination of patient, obtaining history from patient or surrogate, ordering and performing treatments and interventions, ordering and review of laboratory studies, ordering and review of radiographic studies, pulse oximetry, re-evaluation of patient's condition and review of old charts   I assumed direction of critical care for this patient from another provider in my specialty: no      Critical Care performed: Yes, see critical care note(s)  ____________________________________________   INITIAL IMPRESSION / ASSESSMENT AND PLAN / ED COURSE  As part of my medical decision making, I reviewed the following data within the electronic MEDICAL RECORD NUMBER Notes from prior ED visits and [place] Controlled Substance Database   This is an 84 year old female who comes into the hospital today with some chest pain and epigastric pain.  The patient is coming from her nursing home.  The family is concerned as the patient does have a history of a thoracic aortic aneurysm as well as an abdominal aortic aneurysm.  She is here for evaluation.  We did check a CBC, CMP troponin and lipase on the patient.  We also sent the patient for a chest x-ray and a CT angio of her chest abdomen and pelvis.  The patient CBC is unremarkable.  The patient has a creatinine of 1.41 which is higher than previous.  The  patient's lipase is negative and hepatic function is also negative.  The patient's troponin is 0.03.  The patient received nitroglycerin for her chest pain while she was in the emergency department as well as for her blood pressure.  I did receive a phone call from the radiologist stating that the patient had some increase in her aneurysms and some acute aortic syndrome which is a dissection equivalent.  I initially contacted Dr. Delana Meyer who is the patient's vascular surgeon.  The patient also has some intramural hematoma in the thoracic aortic aneurysm and he feels that since the patient has this complex aneurysm in her chest that it would be better for her to be transferred to another facility.  I did contact [facility] and spoke to Dr. Florinda Marker.  While he was willing to accept the patient the hospital was on  diversion and was unable to accept the patient in transfer.  I contacted [facility] and spoke with Dr. Vinnie Level.  He did request that I ensure that the patient's family was considering surgery.  I spoke with the patient's son regarding whether or not they would want surgery for the patient.  They did want the patient to have surgery to repair this aneurysm given its increase in size.  The patient will be transferred over to [facility] for aneurysm repair.  We did start the patient on an esmolol drip in an effort to manage her blood pressure.  The patient's blood pressure did increase back into the 200s over 100s.  At this time we are titrating the drip and the patient is not having any acute complaints.  She will be transferred to [facility].      ____________________________________________   FINAL CLINICAL IMPRESSION(S) / ED DIAGNOSES  Final diagnoses:  Other chest pain  Thoracic aortic aneurysm without rupture ([place])  Abdominal aortic aneurysm (AAA) without rupture [facility])     ED Discharge Orders    None       Note:  This document was prepared using Dragon voice recognition  software and may include unintentional dictation errors.    Loney Hering, MD 06/17/18 [phone]

## 2018-06-17 NOTE — ED Notes (Signed)
[facility] and Medical Necessity documentation reviewed at this time and noted to be complete per policy. 

## 2018-07-10 DEATH — deceased

## 2018-10-31 IMAGING — MR MR HEAD W/O CM
10 series · 39 of 48 positions shown · non-contrast
Comparison: Head CT 09/09/2017

CLINICAL DATA: Central vertigo.  Hypertension.

EXAM:
MRI HEAD WITHOUT CONTRAST
TECHNIQUE: Multiplanar, multiecho pulse sequences of the brain and surrounding
structures were obtained without intravenous contrast.

[Series 2: T1 · sagittal · 5.0mm · 0.47mm/px · 2 of 23 slices shown (1 of 2)]
[im 1/23]
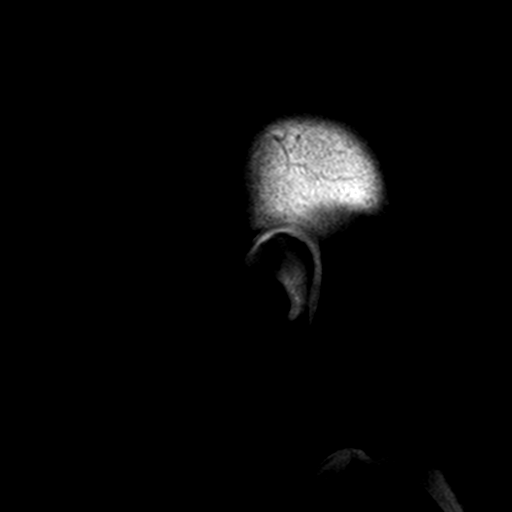
[im 23/23]
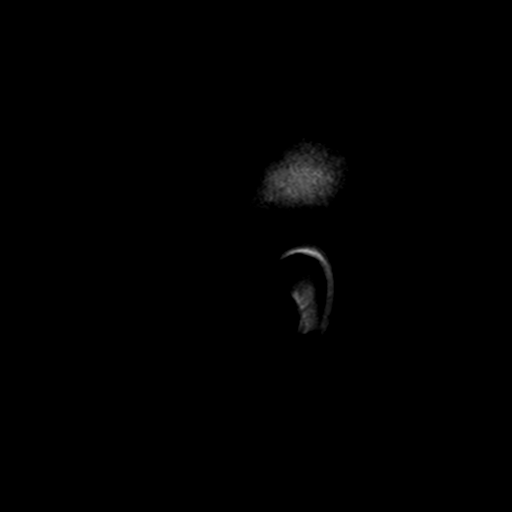

[Series 4: DWI · axial · 3.0mm · 0.94mm/px · z∈[-73,+74]mm · 5 of 50 slices shown (1 of 4)]
[im 1/50]
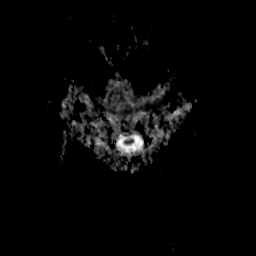
[im 13/50]
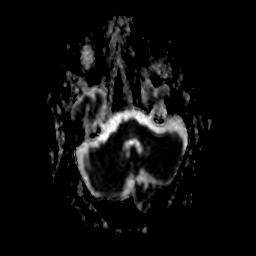
[im 25/50]
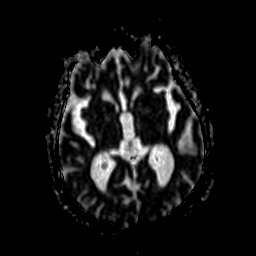
[im 37/50]
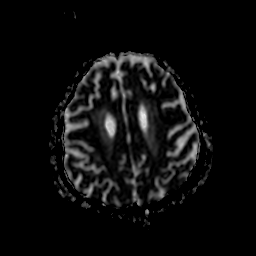
[im 50/50]
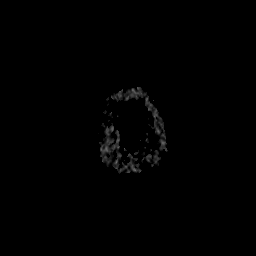

[Series 5: DWI · axial · 3.0mm · 0.94mm/px · z∈[-73,+74]mm · 5 of 50 slices shown (2 of 4)]
[im 1/50]
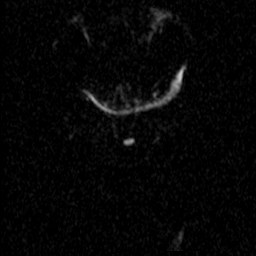
[im 13/50]
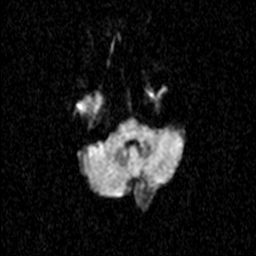
[im 25/50]
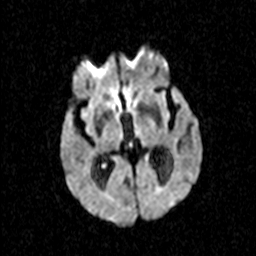
[im 37/50]
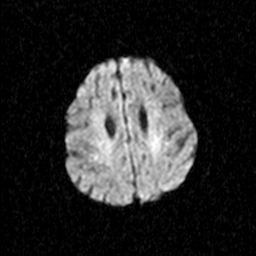
[im 50/50]
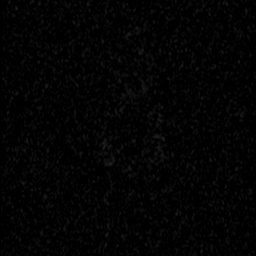

[Series 7: DWI · coronal · 5.0mm · 1.80mm/px · 4 of 39 slices shown (3 of 4)]
[im 1/39]
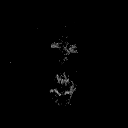
[im 13/39]
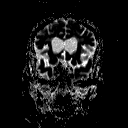
[im 26/39]
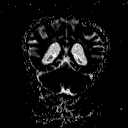
[im 39/39]
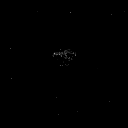

[Series 8: DWI · coronal · 5.0mm · 1.80mm/px · 4 of 38 slices shown (4 of 4)]
[im 1/38]
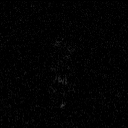
[im 13/38]
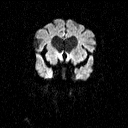
[im 25/38]
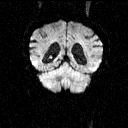
[im 38/38]
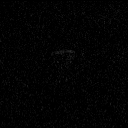

[Series 9: T2 · axial · 5.0mm · 0.45mm/px · z∈[-78,+76]mm · 2 of 23 slices shown (1 of 3)]
[im 1/23]
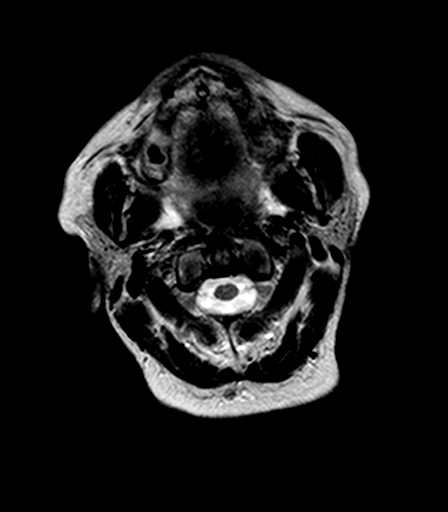
[im 23/23]
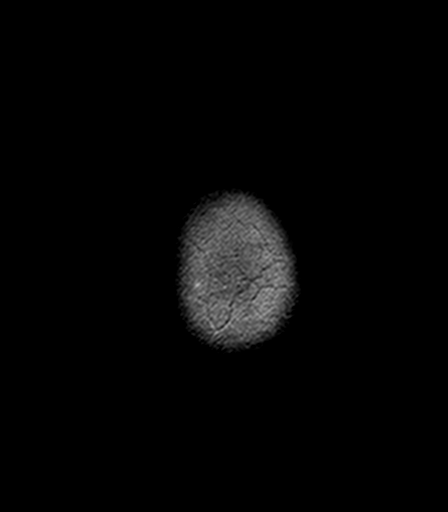

[Series 10: FLAIR · axial · 3.0mm · 0.90mm/px · z∈[-73,+74]mm · 5 of 50 slices shown]
[im 1/50]
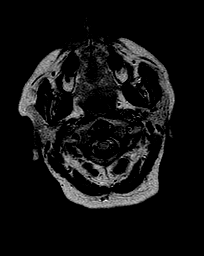
[im 13/50]
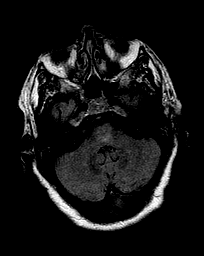
[im 25/50]
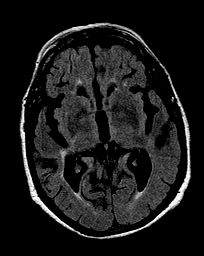
[im 37/50]
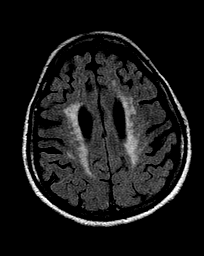
[im 50/50]
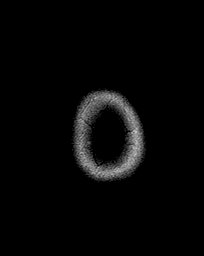

[Series 11: T2 · axial · 5.0mm · 0.45mm/px · z∈[-78,+76]mm · 2 of 23 slices shown (2 of 3)]
[im 1/23]
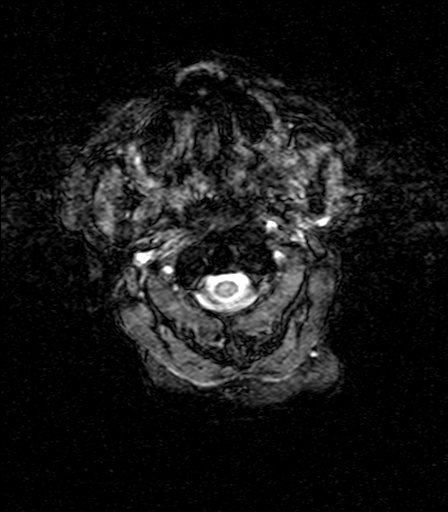
[im 23/23]
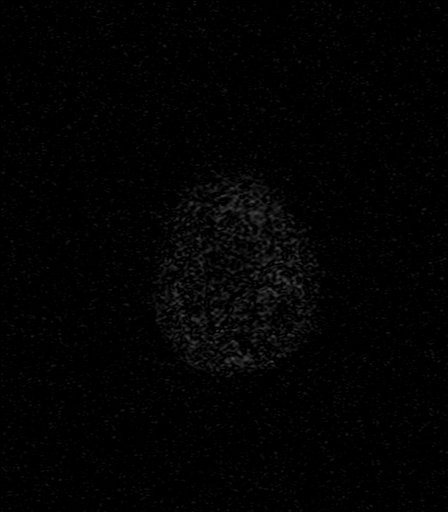

[Series 12: T1 · axial · 1.0mm · 0.45mm/px · z∈[-71,+56]mm · 7 of 160 slices shown (2 of 2)]
[im 11/160]
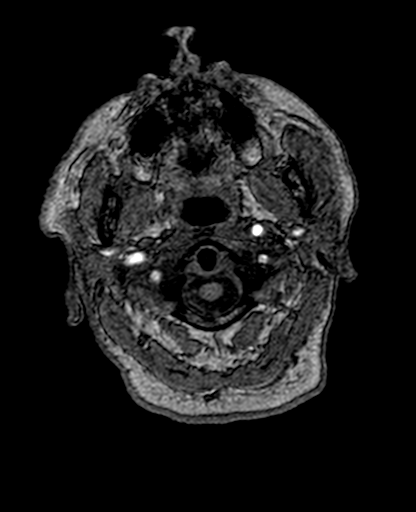
[im 32/160]
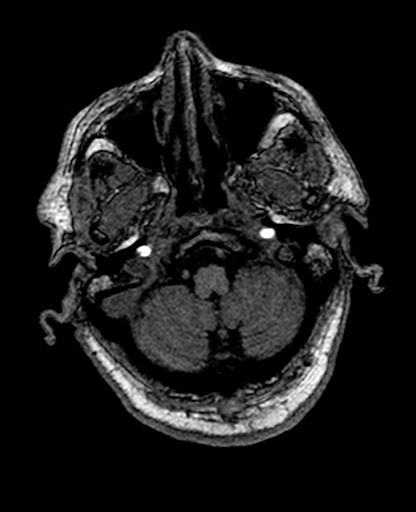
[im 54/160]
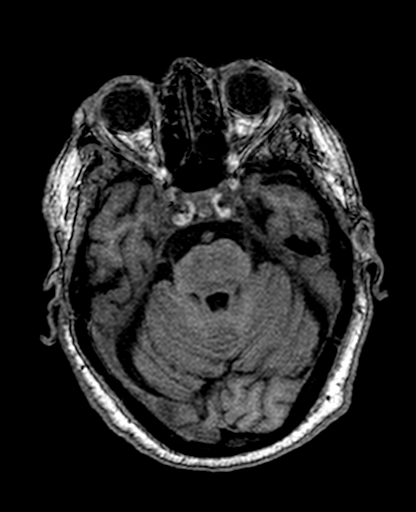
[im 75/160]
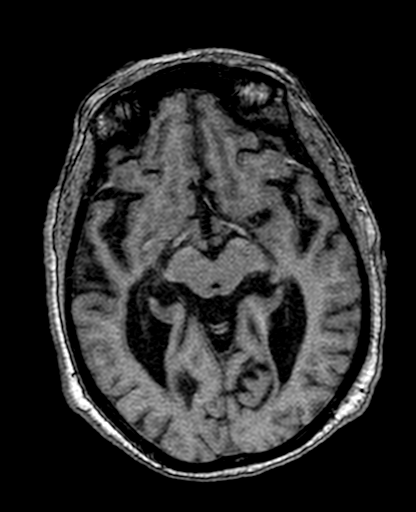
[im 96/160]
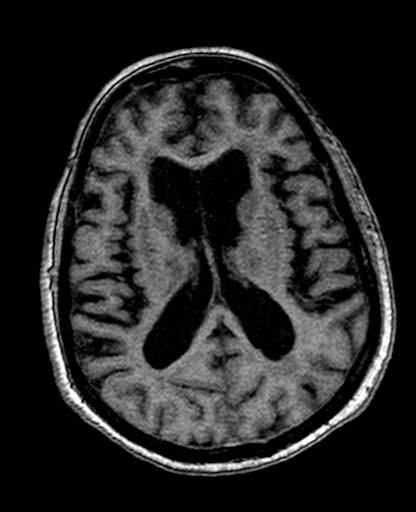
[im 117/160]
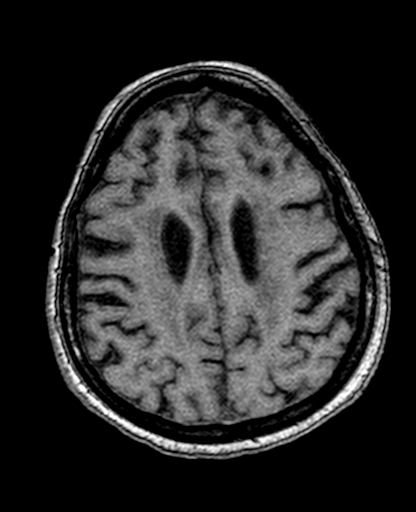
[im 138/160]
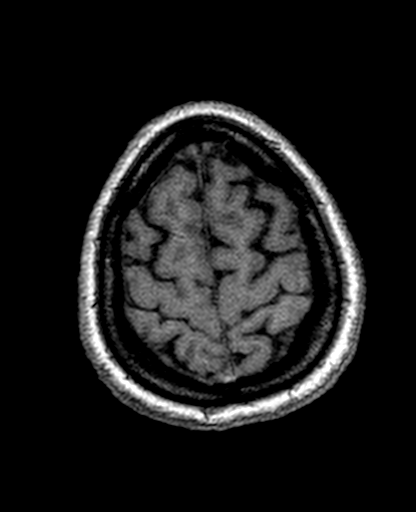

[Series 13: T2 · coronal · 5.0mm · 0.45mm/px · 3 of 29 slices shown (3 of 3)]
[im 1/29]
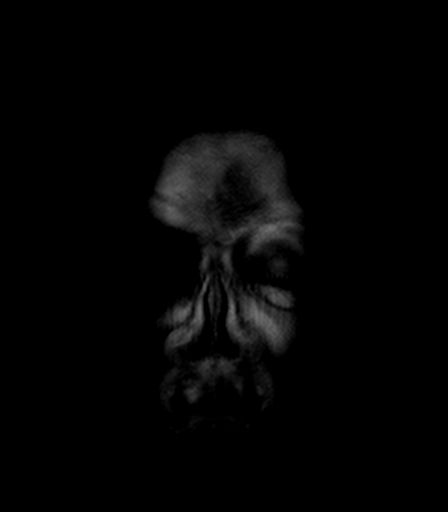
[im 15/29]
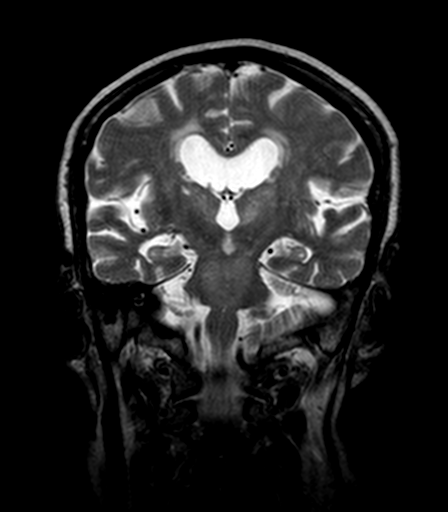
[im 29/29]
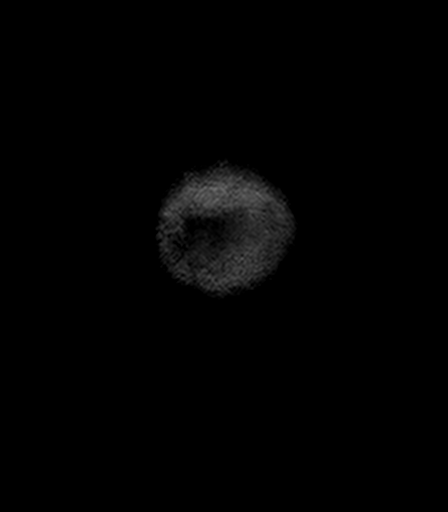

[39 of 48 positions shown; findings below may reference images not displayed]

FINDINGS: Brain: Pituitary gland is enlarged for age, measuring 12 mm in
craniocaudal dimension. There is no focal diffusion restriction to
indicate acute infarct. There is beginning confluent hyperintense
T2-weighted signal within the brainstem and the periventricular and
deep white matter, most often seen in the setting of chronic
microvascular ischemia. Single focus chronic microhemorrhage in the
posterior left temporal lobe. Brain volume is normal for age without
lobar predominant atrophy. The dura is normal and there is no
extra-axial collection.

Vascular: Major intracranial arterial and venous sinus flow voids
are preserved.

Skull and upper cervical spine: The visualized skull base,
calvarium, upper cervical spine and extracranial soft tissues are
normal.

Sinuses/Orbits: No fluid levels or advanced mucosal thickening. No
mastoid or middle ear effusion. Normal orbits.
IMPRESSION: 1. No acute abnormality.
2. Moderate findings of chronic ischemic microangiopathy.
3. Mildly enlarged pituitary gland for age. Correlation with
laboratory values may be helpful to assess for possible underlying
adenoma. No mass effect on adjacent structures.
